# Patient Record
Sex: Male | Born: 1935
Health system: Southern US, Community
[De-identification: ages and names within clinical notes are randomized; demographics above are authoritative.]

## PROBLEM LIST (undated history)

## (undated) DIAGNOSIS — E0590 Thyrotoxicosis, unspecified without thyrotoxic crisis or storm: Secondary | ICD-10-CM

## (undated) DIAGNOSIS — K219 Gastro-esophageal reflux disease without esophagitis: Secondary | ICD-10-CM

## (undated) DIAGNOSIS — C61 Malignant neoplasm of prostate: Secondary | ICD-10-CM

## (undated) HISTORY — PX: PROSTATE SURGERY: SHX751

## (undated) HISTORY — PX: TONSILLECTOMY: SUR1361

## (undated) HISTORY — DX: Malignant neoplasm of prostate: C61

## (undated) HISTORY — DX: Gastro-esophageal reflux disease without esophagitis: K21.9

---

## 1997-12-30 ENCOUNTER — Other Ambulatory Visit: Admission: RE | Admit: 1997-12-30 | Discharge: 1997-12-30 | Payer: Self-pay | Admitting: Urology

## 1998-01-05 ENCOUNTER — Encounter: Admission: RE | Admit: 1998-01-05 | Discharge: 1998-04-05 | Payer: Self-pay | Admitting: Radiation Oncology

## 1998-02-10 ENCOUNTER — Inpatient Hospital Stay (HOSPITAL_COMMUNITY): Admission: RE | Admit: 1998-02-10 | Discharge: 1998-02-14 | Payer: Self-pay | Admitting: Urology

## 1998-02-21 ENCOUNTER — Ambulatory Visit (HOSPITAL_COMMUNITY): Admission: RE | Admit: 1998-02-21 | Discharge: 1998-02-21 | Payer: Self-pay | Admitting: Urology

## 2001-02-17 ENCOUNTER — Ambulatory Visit: Admission: RE | Admit: 2001-02-17 | Discharge: 2001-05-18 | Payer: Self-pay | Admitting: Radiation Oncology

## 2003-08-12 ENCOUNTER — Ambulatory Visit (HOSPITAL_COMMUNITY): Admission: RE | Admit: 2003-08-12 | Discharge: 2003-08-12 | Payer: Self-pay | Admitting: Orthopedic Surgery

## 2008-06-11 ENCOUNTER — Ambulatory Visit: Payer: Self-pay | Admitting: Oncology

## 2008-06-16 ENCOUNTER — Ambulatory Visit (HOSPITAL_COMMUNITY): Admission: RE | Admit: 2008-06-16 | Discharge: 2008-06-16 | Payer: Self-pay | Admitting: Urology

## 2008-06-22 LAB — CBC WITH DIFFERENTIAL/PLATELET
Basophils Absolute: 0 10*3/uL (ref 0.0–0.1)
EOS%: 7.6 % — ABNORMAL HIGH (ref 0.0–7.0)
Eosinophils Absolute: 0.5 10*3/uL (ref 0.0–0.5)
MCHC: 34.5 g/dL (ref 32.0–35.9)
MONO#: 0.8 10*3/uL (ref 0.1–0.9)
NEUT#: 4.4 10*3/uL (ref 1.5–6.5)
RBC: 5.23 10*6/uL (ref 4.20–5.71)
lymph#: 1.1 10*3/uL (ref 0.9–3.3)

## 2008-06-22 LAB — PSA: PSA: 9.45 ng/mL — ABNORMAL HIGH (ref 0.10–4.00)

## 2008-06-22 LAB — COMPREHENSIVE METABOLIC PANEL
ALT: 14 U/L (ref 0–53)
Alkaline Phosphatase: 66 U/L (ref 39–117)
BUN: 22 mg/dL (ref 6–23)
Glucose, Bld: 78 mg/dL (ref 70–99)
Total Bilirubin: 0.6 mg/dL (ref 0.3–1.2)

## 2008-07-09 ENCOUNTER — Ambulatory Visit (HOSPITAL_COMMUNITY): Admission: RE | Admit: 2008-07-09 | Discharge: 2008-07-09 | Payer: Self-pay | Admitting: Urology

## 2009-10-25 ENCOUNTER — Ambulatory Visit (HOSPITAL_COMMUNITY): Admission: RE | Admit: 2009-10-25 | Discharge: 2009-10-25 | Payer: Self-pay | Admitting: Urology

## 2011-12-11 ENCOUNTER — Other Ambulatory Visit: Payer: Self-pay | Admitting: Urology

## 2011-12-11 DIAGNOSIS — C61 Malignant neoplasm of prostate: Secondary | ICD-10-CM

## 2011-12-11 DIAGNOSIS — R972 Elevated prostate specific antigen [PSA]: Secondary | ICD-10-CM

## 2011-12-19 ENCOUNTER — Inpatient Hospital Stay (HOSPITAL_COMMUNITY)
Admission: RE | Admit: 2011-12-19 | Discharge: 2011-12-19 | Payer: Self-pay | Source: Ambulatory Visit | Attending: Urology | Admitting: Urology

## 2011-12-19 ENCOUNTER — Encounter (HOSPITAL_COMMUNITY): Payer: Self-pay

## 2011-12-26 ENCOUNTER — Encounter (HOSPITAL_COMMUNITY)
Admission: RE | Admit: 2011-12-26 | Discharge: 2011-12-26 | Disposition: A | Discharge status: Home / Self Care | Payer: Medicare Other | Source: Ambulatory Visit | Attending: Urology | Admitting: Urology

## 2011-12-26 DIAGNOSIS — R972 Elevated prostate specific antigen [PSA]: Secondary | ICD-10-CM | POA: Insufficient documentation

## 2011-12-26 DIAGNOSIS — C61 Malignant neoplasm of prostate: Secondary | ICD-10-CM

## 2011-12-26 MED ORDER — TECHNETIUM TC 99M MEDRONATE IV KIT
25.0000 | PACK | Freq: Once | INTRAVENOUS | Status: AC | PRN
  Administered 2011-12-26: 25 via INTRAVENOUS

## 2012-11-06 ENCOUNTER — Other Ambulatory Visit: Payer: Self-pay | Admitting: Urology

## 2012-11-06 DIAGNOSIS — C61 Malignant neoplasm of prostate: Secondary | ICD-10-CM

## 2012-12-26 ENCOUNTER — Encounter (HOSPITAL_COMMUNITY)
Admission: RE | Admit: 2012-12-26 | Discharge: 2012-12-26 | Disposition: A | Discharge status: Home / Self Care | Payer: Medicare Other | Source: Ambulatory Visit | Attending: Urology | Admitting: Urology

## 2012-12-26 DIAGNOSIS — C61 Malignant neoplasm of prostate: Secondary | ICD-10-CM | POA: Insufficient documentation

## 2012-12-26 MED ORDER — TECHNETIUM TC 99M MEDRONATE IV KIT
24.9000 | PACK | Freq: Once | INTRAVENOUS | Status: AC | PRN
  Administered 2012-12-26: 24.9 via INTRAVENOUS

## 2013-11-26 ENCOUNTER — Other Ambulatory Visit: Payer: Self-pay | Admitting: Urology

## 2013-11-26 DIAGNOSIS — C61 Malignant neoplasm of prostate: Secondary | ICD-10-CM

## 2014-01-14 ENCOUNTER — Encounter (HOSPITAL_COMMUNITY): Payer: Medicare Other

## 2014-01-29 ENCOUNTER — Encounter (HOSPITAL_COMMUNITY)
Admission: RE | Admit: 2014-01-29 | Discharge: 2014-01-29 | Disposition: A | Discharge status: Home / Self Care | Payer: Medicare Other | Source: Ambulatory Visit | Attending: Urology | Admitting: Urology

## 2014-01-29 DIAGNOSIS — C61 Malignant neoplasm of prostate: Secondary | ICD-10-CM | POA: Insufficient documentation

## 2014-01-29 MED ORDER — TECHNETIUM TC 99M MEDRONATE IV KIT
26.0000 | PACK | Freq: Once | INTRAVENOUS | Status: AC | PRN
  Administered 2014-01-29: 26 via INTRAVENOUS

## 2014-04-07 ENCOUNTER — Telehealth: Payer: Self-pay | Admitting: Oncology

## 2014-04-07 NOTE — Telephone Encounter (Signed)
LEFT MESSAGE FOR PATIENT AND GAVE NP APPT FOR 09/18 @ 10:30 W/DR. SHADAD. LEFT CONTACT INFORMATION FOR PATIENT TO RETURN CALL AND CONFIRM NP APPT.  [place] [facility] DX- PROSTATE CA

## 2014-04-08 ENCOUNTER — Telehealth: Payer: Self-pay | Admitting: Oncology

## 2014-04-08 ENCOUNTER — Other Ambulatory Visit: Payer: Self-pay | Admitting: Oncology

## 2014-04-08 DIAGNOSIS — C61 Malignant neoplasm of prostate: Secondary | ICD-10-CM

## 2014-04-08 NOTE — Telephone Encounter (Signed)
C/D 04/08/14 for appt. 04/09/14 °

## 2014-04-08 NOTE — Telephone Encounter (Signed)
PATIENT CALLED TO CONFIRM NP APPT FOR 09/18 @ 10:30 W/DR. SHADAD.  REFERRING DR. Diona Fanti  DX-PROSTATE CA

## 2014-04-09 ENCOUNTER — Other Ambulatory Visit (HOSPITAL_BASED_OUTPATIENT_CLINIC_OR_DEPARTMENT_OTHER): Payer: Medicare Other

## 2014-04-09 ENCOUNTER — Encounter: Payer: Self-pay | Admitting: Oncology

## 2014-04-09 ENCOUNTER — Ambulatory Visit (HOSPITAL_BASED_OUTPATIENT_CLINIC_OR_DEPARTMENT_OTHER): Payer: Medicare Other | Admitting: Oncology

## 2014-04-09 ENCOUNTER — Encounter (INDEPENDENT_AMBULATORY_CARE_PROVIDER_SITE_OTHER): Payer: Self-pay

## 2014-04-09 ENCOUNTER — Telehealth: Payer: Self-pay | Admitting: Oncology

## 2014-04-09 ENCOUNTER — Ambulatory Visit (HOSPITAL_BASED_OUTPATIENT_CLINIC_OR_DEPARTMENT_OTHER): Payer: Medicare Other

## 2014-04-09 VITALS — BP 179/77 | HR 81 | Temp 97.8°F | Wt 210.0 lb

## 2014-04-09 DIAGNOSIS — C61 Malignant neoplasm of prostate: Secondary | ICD-10-CM

## 2014-04-09 DIAGNOSIS — E291 Testicular hypofunction: Secondary | ICD-10-CM

## 2014-04-09 LAB — CBC WITH DIFFERENTIAL/PLATELET
BASO%: 0.5 % (ref 0.0–2.0)
BASOS ABS: 0 10*3/uL (ref 0.0–0.1)
EOS ABS: 0.5 10*3/uL (ref 0.0–0.5)
EOS%: 7.9 % — ABNORMAL HIGH (ref 0.0–7.0)
HEMATOCRIT: 42.5 % (ref 38.4–49.9)
HGB: 13.8 g/dL (ref 13.0–17.1)
LYMPH#: 1.4 10*3/uL (ref 0.9–3.3)
LYMPH%: 23.9 % (ref 14.0–49.0)
MCH: 28.3 pg (ref 27.2–33.4)
MCHC: 32.4 g/dL (ref 32.0–36.0)
MCV: 87.1 fL (ref 79.3–98.0)
MONO#: 0.8 10*3/uL (ref 0.1–0.9)
MONO%: 14.7 % — AB (ref 0.0–14.0)
NEUT%: 53 % (ref 39.0–75.0)
NEUTROS ABS: 3 10*3/uL (ref 1.5–6.5)
PLATELETS: 268 10*3/uL (ref 140–400)
RBC: 4.88 10*6/uL (ref 4.20–5.82)
RDW: 12.6 % (ref 11.0–14.6)
WBC: 5.7 10*3/uL (ref 4.0–10.3)

## 2014-04-09 LAB — COMPREHENSIVE METABOLIC PANEL (CC13)
ALBUMIN: 3.7 g/dL (ref 3.5–5.0)
ALK PHOS: 92 U/L (ref 40–150)
ALT: 19 U/L (ref 0–55)
AST: 20 U/L (ref 5–34)
Anion Gap: 9 mEq/L (ref 3–11)
BUN: 15.7 mg/dL (ref 7.0–26.0)
CALCIUM: 9.1 mg/dL (ref 8.4–10.4)
CO2: 25 mEq/L (ref 22–29)
Chloride: 106 mEq/L (ref 98–109)
Creatinine: 0.8 mg/dL (ref 0.7–1.3)
GLUCOSE: 96 mg/dL (ref 70–140)
POTASSIUM: 4.3 meq/L (ref 3.5–5.1)
SODIUM: 140 meq/L (ref 136–145)
TOTAL PROTEIN: 6.7 g/dL (ref 6.4–8.3)
Total Bilirubin: 0.49 mg/dL (ref 0.20–1.20)

## 2014-04-09 MED ORDER — PREDNISONE 5 MG PO TABS: 5.0000 mg | ORAL_TABLET | Freq: Every day | ORAL | Status: DC

## 2014-04-09 MED ORDER — KETOCONAZOLE 200 MG PO TABS: ORAL_TABLET | ORAL | Status: DC

## 2014-04-09 NOTE — Progress Notes (Signed)
Checked in new patient with no financial issues prior to seeing the dr. He has appt card and has not been out of the country. °

## 2014-04-09 NOTE — Progress Notes (Signed)
Please see consult note.  

## 2014-04-09 NOTE — Telephone Encounter (Signed)
gv adn printed appt sched and avs for pt for OCT....sed gv barium

## 2014-04-09 NOTE — Consult Note (Signed)
Reason for Referral: Prostate cancer.   HPI: 78 year old gentleman currently retired and has lived in this area for the last 30 years. He is a rather healthy gentleman without any significant comorbid conditions. He was diagnosed with prostate cancer dating back in June of 1999. His PSA was around 4 and the biopsy showed a Gleason score 3+3 equals 6. On 02/10/1998 he underwent radical prostatectomy with the final pathology revealed a Gleason score 3+4 equals 7, stage T3b disease with extracapsular extension. His PSA become undetectable to about 2002. His PSA was 0.61. He completed salvage radiation therapy in 05/09/2001. But his PSA continued to rise and was started on Lupron in March of 2010 for a PSA of 22. His PSA nadir was as low as 0.46 and was given intermittently. His PSA persistently started to rise and was up to 9.82 in February of 2015 whereby Casodex was added. Despite that, the PSA continued to rise in July was up to 11.6 and in August of 2015 was up to 12. Repeat bone scan in July 2050 did not show any evidence of measurable disease.  Clinically he is asymptomatic. He does not report any bone pain or constitutional symptoms. He does not report any dysuria or hematuria. He does report nocturia and frequency. He does not report any headaches, blurred vision or syncope. He does not report any chest pain, shortness of breath, leg edema or palpitation. He does not report any wheezing, cough or hemoptysis. Is not report any fevers, chills or weight loss. He is to be active and performs activities of daily living. He reports no nausea, vomiting, constipation, diarrhea. He does not report any skeletal complaints no arthralgias or myalgias. He is not report any lymphadenopathy or petechiae. He continues to be an excellent performance status and rest of his review of systems unremarkable.   Past Medical History  Diagnosis Date  . Prostate cancer   . GERD (gastroesophageal reflux disease)    :  Current Outpatient Prescriptions  Medication Sig Dispense Refill  . bicalutamide (CASODEX) 50 MG tablet Take 50 mg by mouth daily.      Marland Kitchen ketoconazole (NIZORAL) 200 MG tablet Take one tablet by mouth twice a day.  60 tablet  3  . predniSONE (DELTASONE) 5 MG tablet Take 1 tablet (5 mg total) by mouth daily with breakfast.  30 tablet  3   No current facility-administered medications for this visit.    Allergies  Allergen Reactions  . Amoxicillin   :   Family history: He has 2 brothers had prostate cancer.   History   Social History  . Marital Status: Married    Spouse Name: N/A    Number of Children: N/A  . Years of Education: N/A   Occupational History  . Not on file.   Social History Main Topics  . Smoking status: Not on file  . Smokeless tobacco: Not on file  . Alcohol Use: Not on file  . Drug Use: Not on file  . Sexual Activity: Not on file   Other Topics Concern  . Not on file   Social History Narrative  . No narrative on file  :  Pertinent items are noted in HPI.  Exam: ECOG 0 Blood pressure 179/77, pulse 81, temperature 97.8 F (36.6 C), temperature source Oral, weight 210 lb (95.255 kg), SpO2 99.00%. General appearance: alert and cooperative Head: Normocephalic, without obvious abnormality Throat: lips, mucosa, and tongue normal; teeth and gums normal Neck: no adenopathy Back: symmetric, no curvature.  ROM normal. No CVA tenderness. Resp: clear to auscultation bilaterally Chest wall: no tenderness Cardio: regular rate and rhythm, S1, S2 normal, no murmur, click, rub or gallop GI: soft, non-tender; bowel sounds normal; no masses,  no organomegaly Extremities: extremities normal, atraumatic, no cyanosis or edema Pulses: 2+ and symmetric Skin: Skin color, texture, turgor normal. No rashes or lesions Lymph nodes: Cervical, supraclavicular, and axillary nodes normal. Neurologic: Grossly normal   Recent Labs  04/09/14 1037  WBC 5.7  HGB 13.8   HCT 42.5  PLT 268    Assessment and Plan:   78 year old gentleman with the following issues:  1. Prostate cancer diagnosed in 1999 with a Gleason score 3+4 equals 7 and a PSA of around 4. He is status post prostatectomy and found to have stage T3b disease. He received salvage radiation therapy for a rise in his PSA. He developed biochemical relapse and was treated with androgen depravation intermittently and most recently continuously with excellent response between 2010 to about 2015. 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 The natural course of this disease was discussed with the patient in detail. Options for treatment were discussed including second line hormonal manipulation with ketoconazole and prednisone, Nicki Reaper, immunotherapy and systemic chemotherapy. The first up is to complete the staging process with a CT scan of the abdomen and pelvis. If he has measurable disease I favor using Xtandi or systemic chemotherapy. If he has no measurable disease, then ketoconazole and prednisone as a reasonable option after stopping Casodex.  The risks and benefits of ketoconazole and prednisone were discussed. Complications include GI toxicity, skin toxicity, adrenal insufficiency, electrolyte imbalance as well as complications related to prednisone were discussed. He is agreeable to try for the time being if his CT scan showed no measurable disease.  I will set him up with a followup in one month to assess response in any case.  2. Androgen depravation: This will be continued under the care of Dr. Diona Fanti for the time being.

## 2014-04-10 LAB — PSA: PSA: 17.17 ng/mL — AB (ref <=4.00)

## 2014-04-10 LAB — TESTOSTERONE: TESTOSTERONE: 47 ng/dL — AB (ref 300–890)

## 2014-04-12 ENCOUNTER — Ambulatory Visit (HOSPITAL_COMMUNITY)
Admission: RE | Admit: 2014-04-12 | Discharge: 2014-04-12 | Disposition: A | Discharge status: Home / Self Care | Payer: Medicare Other | Source: Ambulatory Visit | Attending: Oncology | Admitting: Oncology

## 2014-04-12 DIAGNOSIS — C61 Malignant neoplasm of prostate: Secondary | ICD-10-CM | POA: Diagnosis not present

## 2014-04-12 MED ORDER — IOHEXOL 300 MG/ML  SOLN
100.0000 mL | Freq: Once | INTRAMUSCULAR | Status: AC | PRN
  Administered 2014-04-12: 100 mL via INTRAVENOUS

## 2014-04-19 ENCOUNTER — Encounter: Payer: Self-pay | Admitting: Oncology

## 2014-05-07 ENCOUNTER — Other Ambulatory Visit: Payer: Self-pay

## 2014-05-10 ENCOUNTER — Other Ambulatory Visit (HOSPITAL_BASED_OUTPATIENT_CLINIC_OR_DEPARTMENT_OTHER): Payer: Medicare Other

## 2014-05-10 DIAGNOSIS — C61 Malignant neoplasm of prostate: Secondary | ICD-10-CM

## 2014-05-10 LAB — COMPREHENSIVE METABOLIC PANEL (CC13)
ALK PHOS: 74 U/L (ref 40–150)
ALT: 23 U/L (ref 0–55)
ANION GAP: 8 meq/L (ref 3–11)
AST: 16 U/L (ref 5–34)
Albumin: 3.4 g/dL — ABNORMAL LOW (ref 3.5–5.0)
BILIRUBIN TOTAL: 0.47 mg/dL (ref 0.20–1.20)
BUN: 19.7 mg/dL (ref 7.0–26.0)
CHLORIDE: 107 meq/L (ref 98–109)
CO2: 27 meq/L (ref 22–29)
Calcium: 9.1 mg/dL (ref 8.4–10.4)
Creatinine: 1.1 mg/dL (ref 0.7–1.3)
GLUCOSE: 102 mg/dL (ref 70–140)
Potassium: 4 mEq/L (ref 3.5–5.1)
SODIUM: 142 meq/L (ref 136–145)
Total Protein: 6 g/dL — ABNORMAL LOW (ref 6.4–8.3)

## 2014-05-10 LAB — PSA: PSA: 12.36 ng/mL — AB (ref <=4.00)

## 2014-05-10 LAB — CBC WITH DIFFERENTIAL/PLATELET
BASO%: 0.6 % (ref 0.0–2.0)
BASOS ABS: 0 10*3/uL (ref 0.0–0.1)
EOS%: 9.9 % — ABNORMAL HIGH (ref 0.0–7.0)
Eosinophils Absolute: 0.5 10*3/uL (ref 0.0–0.5)
HCT: 42 % (ref 38.4–49.9)
HGB: 13.5 g/dL (ref 13.0–17.1)
LYMPH%: 20.9 % (ref 14.0–49.0)
MCH: 28.4 pg (ref 27.2–33.4)
MCHC: 32.2 g/dL (ref 32.0–36.0)
MCV: 88.1 fL (ref 79.3–98.0)
MONO#: 0.6 10*3/uL (ref 0.1–0.9)
MONO%: 10.6 % (ref 0.0–14.0)
NEUT#: 3.1 10*3/uL (ref 1.5–6.5)
NEUT%: 58 % (ref 39.0–75.0)
PLATELETS: 254 10*3/uL (ref 140–400)
RBC: 4.77 10*6/uL (ref 4.20–5.82)
RDW: 13.2 % (ref 11.0–14.6)
WBC: 5.4 10*3/uL (ref 4.0–10.3)
lymph#: 1.1 10*3/uL (ref 0.9–3.3)

## 2014-05-11 ENCOUNTER — Telehealth: Payer: Self-pay | Admitting: Oncology

## 2014-05-11 ENCOUNTER — Ambulatory Visit (HOSPITAL_BASED_OUTPATIENT_CLINIC_OR_DEPARTMENT_OTHER): Payer: Medicare Other | Admitting: Oncology

## 2014-05-11 VITALS — BP 131/85 | HR 76 | Temp 97.5°F | Resp 18 | Wt 207.2 lb

## 2014-05-11 DIAGNOSIS — C61 Malignant neoplasm of prostate: Secondary | ICD-10-CM

## 2014-05-11 NOTE — Progress Notes (Signed)
Hematology and Oncology Follow Up Visit  Edward Bailey [id number] [birth year] 78 y.o. 05/11/2014 4:12 PM No primary provider on file.No ref. provider found   Principle Diagnosis: 78 year old gentleman with prostate cancer diagnosed in 1999 with a Gleason score 3+4 equals 7 and a PSA of around 4. He is currently experiencing a biochemical relapse without any measurable disease.    Prior Therapy: He is status post prostatectomy and found to have stage T3b disease. He received salvage radiation therapy for a rise in his PSA. He developed biochemical relapse and was treated with androgen depravation intermittently and most recently continuously with excellent response between 2010 to about 2015. 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   Current therapy: Ketoconazole at 200 mg twice a day and prednisone 5 mg daily.  Interim History:  Edward Bailey presents today for a followup visit. Since her last visit, he started ketoconazole and prednisone without any complications. He did not report any nausea, vomiting, lower extremity edema or ecchymosis. He did not report any bony pain or worsening back pain.He does not report any dysuria or hematuria. He does report nocturia and frequency. He does not report any headaches, blurred vision or syncope. He does not report any chest pain, shortness of breath, leg edema or palpitation. He does not report any wheezing, cough or hemoptysis. Is not report any fevers, chills or weight loss. He is to be active and performs activities of daily living. He reports no nausea, vomiting, constipation, diarrhea. He does not report any skeletal complaints no arthralgias or myalgias. He is not report any lymphadenopathy or petechiae. He continues to be an excellent performance status and rest of his review of systems unremarkable.      Medications: I have reviewed the patient's current medications.   Current Outpatient Prescriptions  Medication Sig Dispense Refill  . ketoconazole (NIZORAL) 200 MG tablet Take one tablet by mouth twice a day.  60 tablet  3  . predniSONE (DELTASONE) 5 MG tablet Take 1 tablet (5 mg total) by mouth daily with breakfast.  30 tablet  3   No current facility-administered medications for this visit.     Allergies:  Allergies  Allergen Reactions  . Amoxicillin     Past Medical History, Surgical history, Social history, and Family History were reviewed and updated.   Physical Exam: Blood pressure 131/85, pulse 76, temperature 97.5 F (36.4 C), temperature source Oral, resp. rate 18, weight 207 lb 3.2 oz (93.985 kg). ECOG:  General appearance: alert and cooperative Head: Normocephalic, without obvious abnormality Neck: no adenopathy Lymph nodes: Cervical, supraclavicular, and axillary nodes normal. Heart:regular rate and rhythm, S1, S2 normal, no murmur, click, rub or gallop Lung:chest clear, no wheezing, rales, normal symmetric air entry Abdomin: soft, non-tender, without masses or organomegaly EXT:no erythema, induration, or nodules   Lab Results: Lab Results  Component Value Date   WBC 5.4 05/10/2014   HGB 13.5 05/10/2014   HCT 42.0 05/10/2014   MCV 88.1 05/10/2014   PLT 254 05/10/2014     Chemistry      Component Value Date/Time   NA 142 05/10/2014 0902   NA 139 06/22/2008 1042   K 4.0 05/10/2014 0902   K 5.1 06/22/2008 1042   CL 104 06/22/2008 1042   CO2 27 05/10/2014 0902   CO2 28 06/22/2008 1042   BUN 19.7 05/10/2014 0902   BUN 22 06/22/2008 1042   CREATININE 1.1 05/10/2014 0902   CREATININE 1.24  06/22/2008 1042      Component Value Date/Time   CALCIUM 9.1 05/10/2014 0902   CALCIUM 9.5 06/22/2008 1042   ALKPHOS 74 05/10/2014 0902   ALKPHOS 66 06/22/2008 1042   AST 16 05/10/2014 0902   AST 21 06/22/2008 1042   ALT 23 05/10/2014 0902   ALT 14 06/22/2008 1042   BILITOT 0.47 05/10/2014 0902   BILITOT 0.6 06/22/2008 1042       Results for Edward Bailey (MRN [id number]) as of 05/11/2014 15:41  Ref. Range 04/09/2014 10:37 05/10/2014 09:02  PSA Latest Range: <=4.00 ng/mL 17.17 (H) 12.36 (H)     EXAM:  CT ABDOMEN AND PELVIS WITH CONTRAST  TECHNIQUE:  Multidetector CT imaging of the abdomen and pelvis was performed  using the standard protocol following bolus administration of  intravenous contrast.  CONTRAST: 125mL OMNIPAQUE IOHEXOL 300 MG/ML SOLN  COMPARISON: Bone scan 01/29/2014  FINDINGS:  Visualization of the lower thorax demonstrates dependent  atelectasis. Normal heart size. Coronary arterial vascular  calcifications.  The left hepatic lobe is atrophic. No focal hepatic lesion is  identified. Gallbladder is unremarkable. No intrahepatic or  extrahepatic biliary ductal dilatation. Spleen, pancreas and  bilateral adrenal glands are unremarkable.  Kidneys are normal. No hydronephrosis.  Normal caliber abdominal aorta with scattered calcified  atherosclerotic plaque. No retroperitoneal lymphadenopathy. Small  fat containing left inguinal hernia.  Patient status post prostatectomy. Multiple surgical clips within  the pelvis and pelvic sidewalls. Urinary bladder is unremarkable.  Sub cm lymph nodes along the right common iliac and right pelvic  sidewall are stable.  Stool is present throughout the colon. The appendix is normal. No  bowel obstruction. No free fluid or free intraperitoneal air.  There is an 8 mm sclerotic focus within the left lateral sacral ala  (image 60; series 2).  IMPRESSION:  Postsurgical changes compatible with prostatectomy and  lymphadenectomy. No CT evidence to suggest local recurrence or  metastatic disease within the abdomen or pelvis.  8 mm sclerotic focus within the left lateral sacral ala new from  prior examination dated 2011 however, in light of negative bone  scan, favored to be degenerative in etiology. Attention on followup.     Impression and Plan:  78  year-old gentleman with the following issues:  1. Prostate cancer diagnosed in 1999 with a Gleason score 3+4 equals 7 and a PSA of around 4. He is status post prostatectomy and found to have stage T3b disease. He developed a biochemical relapse and treated with intermittent androgen deprivation and currently has a rise in his PSA despite castrate levels testosterone. His CT scan on 04/12/2014 was discussed and showed no evidence of any metastatic disease. He started ketoconazole and prednisone and tolerated it well. His PSA dropped down from 17.7-12.36 after one month of therapy. He reports no complications from this medication and the plan is to continue on the current dose and schedule. His liver function tests and electrolytes are all within normal range.  2. Androgen depravation: He'll continues under the care of [place].  3. Followup: In 6 weeks to repeat laboratory testing.       Zola Button, MD 10/20/20154:12 PM

## 2014-05-11 NOTE — Telephone Encounter (Signed)
gv and printed appt sched and avs for pt for NOV and Dec °

## 2014-05-13 ENCOUNTER — Encounter: Payer: Self-pay | Admitting: Oncology

## 2014-05-13 ENCOUNTER — Other Ambulatory Visit: Payer: Self-pay | Admitting: Oncology

## 2014-05-14 ENCOUNTER — Other Ambulatory Visit: Payer: Self-pay | Admitting: *Deleted

## 2014-06-16 ENCOUNTER — Other Ambulatory Visit: Payer: Self-pay | Admitting: Oncology

## 2014-06-18 ENCOUNTER — Other Ambulatory Visit (HOSPITAL_BASED_OUTPATIENT_CLINIC_OR_DEPARTMENT_OTHER): Payer: Medicare Other

## 2014-06-18 DIAGNOSIS — C61 Malignant neoplasm of prostate: Secondary | ICD-10-CM

## 2014-06-18 LAB — CBC WITH DIFFERENTIAL/PLATELET
BASO%: 0.9 % (ref 0.0–2.0)
BASOS ABS: 0 10*3/uL (ref 0.0–0.1)
EOS%: 9.8 % — ABNORMAL HIGH (ref 0.0–7.0)
Eosinophils Absolute: 0.6 10*3/uL — ABNORMAL HIGH (ref 0.0–0.5)
HCT: 43.6 % (ref 38.4–49.9)
HEMOGLOBIN: 14.4 g/dL (ref 13.0–17.1)
LYMPH%: 20.9 % (ref 14.0–49.0)
MCH: 29.2 pg (ref 27.2–33.4)
MCHC: 32.9 g/dL (ref 32.0–36.0)
MCV: 88.6 fL (ref 79.3–98.0)
MONO#: 0.5 10*3/uL (ref 0.1–0.9)
MONO%: 9.2 % (ref 0.0–14.0)
NEUT#: 3.4 10*3/uL (ref 1.5–6.5)
NEUT%: 59.2 % (ref 39.0–75.0)
PLATELETS: 287 10*3/uL (ref 140–400)
RBC: 4.93 10*6/uL (ref 4.20–5.82)
RDW: 13.6 % (ref 11.0–14.6)
WBC: 5.8 10*3/uL (ref 4.0–10.3)
lymph#: 1.2 10*3/uL (ref 0.9–3.3)

## 2014-06-18 LAB — COMPREHENSIVE METABOLIC PANEL (CC13)
ALT: 18 U/L (ref 0–55)
ANION GAP: 9 meq/L (ref 3–11)
AST: 17 U/L (ref 5–34)
Albumin: 3.7 g/dL (ref 3.5–5.0)
Alkaline Phosphatase: 88 U/L (ref 40–150)
BILIRUBIN TOTAL: 0.59 mg/dL (ref 0.20–1.20)
BUN: 19.4 mg/dL (ref 7.0–26.0)
CO2: 26 meq/L (ref 22–29)
CREATININE: 1.1 mg/dL (ref 0.7–1.3)
Calcium: 9.1 mg/dL (ref 8.4–10.4)
Chloride: 107 mEq/L (ref 98–109)
Glucose: 119 mg/dl (ref 70–140)
Potassium: 4 mEq/L (ref 3.5–5.1)
Sodium: 142 mEq/L (ref 136–145)
Total Protein: 6.2 g/dL — ABNORMAL LOW (ref 6.4–8.3)

## 2014-06-19 LAB — PSA: PSA: 11.01 ng/mL — ABNORMAL HIGH (ref <=4.00)

## 2014-06-22 ENCOUNTER — Ambulatory Visit (HOSPITAL_BASED_OUTPATIENT_CLINIC_OR_DEPARTMENT_OTHER): Payer: Medicare Other | Admitting: Oncology

## 2014-06-22 ENCOUNTER — Telehealth: Payer: Self-pay | Admitting: Oncology

## 2014-06-22 VITALS — BP 157/75 | HR 74 | Temp 97.6°F | Resp 18 | Ht 72.0 in | Wt 208.5 lb

## 2014-06-22 DIAGNOSIS — E291 Testicular hypofunction: Secondary | ICD-10-CM

## 2014-06-22 DIAGNOSIS — C61 Malignant neoplasm of prostate: Secondary | ICD-10-CM

## 2014-06-22 NOTE — Progress Notes (Signed)
Hematology and Oncology Follow Up Visit  GARL SPEIGNER [id number] Mar 07, 1936 78 y.o. 06/22/2014 3:00 PM No primary care provider on file.No ref. provider found   Principle Diagnosis: 78 year old gentleman with prostate cancer diagnosed in 1999 with a Gleason score 3+4 equals 7 and a PSA of around 4. He is currently experiencing a biochemical relapse without any measurable disease.    Prior Therapy: He is status post prostatectomy and found to have stage T3b disease. He received salvage radiation therapy for a rise in his PSA. He developed biochemical relapse and was treated with androgen depravation intermittently and most recently continuously with excellent response between 2010 to about 2015. 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   Current therapy: Ketoconazole at 200 mg twice a day and prednisone 5 mg daily.  Interim History:  Mr. Guerrette presents today for a followup visit. Since the last visit, he continues to do well without any issues. He is still on ketoconazole and prednisone without any new side effects. He continues to enjoy excellent quality of life and performance status. He did not report any nausea, vomiting, lower extremity edema or ecchymosis. He did not report any bony pain or worsening back pain.He does not report any dysuria or hematuria. He does report nocturia and frequency. He does not report any headaches, blurred vision or syncope. He does not report any chest pain, shortness of breath, leg edema or palpitation. He does not report any wheezing, cough or hemoptysis. Is not report any fevers, chills or weight loss. He is to be active and performs activities of daily living. He reports no nausea, vomiting, constipation, diarrhea. He does not report any skeletal complaints no arthralgias or myalgias. He is not report any lymphadenopathy or petechiae. The rest of his review of systems unremarkable.       Medications: I have reviewed the patient's current medications.  Current Outpatient Prescriptions  Medication Sig Dispense Refill  . bicalutamide (CASODEX) 50 MG tablet Take 50 mg by mouth daily.  3  . ketoconazole (NIZORAL) 200 MG tablet Take one tablet by mouth twice a day. 60 tablet 3  . predniSONE (DELTASONE) 5 MG tablet TAKE 1 TABLET EVERY DAY WITH BREAKFAST 30 tablet 0   No current facility-administered medications for this visit.     Allergies:  Allergies  Allergen Reactions  . Amoxicillin     Past Medical History, Surgical history, Social history, and Family History were reviewed and updated.   Physical Exam: Blood pressure 157/75, pulse 74, temperature 97.6 F (36.4 C), temperature source Oral, resp. rate 18, height 6' (1.829 m), weight 208 lb 8 oz (94.575 kg). ECOG:  General appearance: alert and cooperative Head: Normocephalic, without obvious abnormality Neck: no adenopathy Lymph nodes: Cervical, supraclavicular, and axillary nodes normal. Heart:regular rate and rhythm, S1, S2 normal, no murmur, click, rub or gallop Lung:chest clear, no wheezing, rales, normal symmetric air entry Abdomin: soft, non-tender, without masses or organomegaly EXT:no erythema, induration, or nodules   Lab Results: Lab Results  Component Value Date   WBC 5.8 06/18/2014   HGB 14.4 06/18/2014   HCT 43.6 06/18/2014   MCV 88.6 06/18/2014   PLT 287 06/18/2014     Chemistry      Component Value Date/Time   NA 142 06/18/2014 0858   NA 139 06/22/2008 1042   K 4.0 06/18/2014 0858   K 5.1 06/22/2008 1042   CL 104 06/22/2008 1042   CO2 26 06/18/2014 0858  CO2 28 06/22/2008 1042   BUN 19.4 06/18/2014 0858   BUN 22 06/22/2008 1042   CREATININE 1.1 06/18/2014 0858   CREATININE 1.24 06/22/2008 1042      Component Value Date/Time   CALCIUM 9.1 06/18/2014 0858   CALCIUM 9.5 06/22/2008 1042   ALKPHOS 88 06/18/2014 0858   ALKPHOS 66 06/22/2008 1042   AST 17 06/18/2014 0858    AST 21 06/22/2008 1042   ALT 18 06/18/2014 0858   ALT 14 06/22/2008 1042   BILITOT 0.59 06/18/2014 0858   BILITOT 0.6 06/22/2008 1042        Results for TODDRICK, SANNA (MRN [id number]) as of 06/22/2014 15:02  Ref. Range 04/09/2014 10:37 05/10/2014 09:02 06/18/2014 08:58  PSA Latest Range: <=4.00 ng/mL 17.17 (H) 12.36 (H) 11.01 (H)       Impression and Plan:  78 year-old gentleman with the following issues:  1. Prostate cancer diagnosed in 1999 with a Gleason score 3+4 equals 7 and a PSA of around 4. He is status post prostatectomy and found to have stage T3b disease. He developed a biochemical relapse and treated with intermittent androgen deprivation and currently has a rise in his PSA despite castrate levels testosterone. His CT scan on 04/12/2014 showed no evidence of any metastatic disease. He is on ketoconazole and prednisone and tolerated it well. His PSA dropped down from 17.7 to 11 so far. He reports no complications from this medication and the plan is to continue on the current dose and schedule. His liver function tests and electrolytes are all within normal range.  2. Androgen depravation: He was advised to continue this under the care of [place].  3. Followup: In 5- 6 weeks to repeat laboratory testing.       [id number],QPYPP, MD 12/1/20153:00 PM

## 2014-06-22 NOTE — Telephone Encounter (Signed)
Pt confirmed labs/ov per 12/01 POF, gave pt AVS.... KJ pt wanted a Friday

## 2014-07-04 ENCOUNTER — Other Ambulatory Visit: Payer: Self-pay | Admitting: Oncology

## 2014-07-26 ENCOUNTER — Other Ambulatory Visit (HOSPITAL_BASED_OUTPATIENT_CLINIC_OR_DEPARTMENT_OTHER): Payer: Medicare Other

## 2014-07-26 DIAGNOSIS — C61 Malignant neoplasm of prostate: Secondary | ICD-10-CM

## 2014-07-26 LAB — CBC WITH DIFFERENTIAL/PLATELET
BASO%: 0.3 % (ref 0.0–2.0)
BASOS ABS: 0 10*3/uL (ref 0.0–0.1)
EOS%: 7.3 % — ABNORMAL HIGH (ref 0.0–7.0)
Eosinophils Absolute: 0.5 10*3/uL (ref 0.0–0.5)
HCT: 44.5 % (ref 38.4–49.9)
HGB: 14.7 g/dL (ref 13.0–17.1)
LYMPH%: 19.8 % (ref 14.0–49.0)
MCH: 29.4 pg (ref 27.2–33.4)
MCHC: 33 g/dL (ref 32.0–36.0)
MCV: 89 fL (ref 79.3–98.0)
MONO#: 0.7 10*3/uL (ref 0.1–0.9)
MONO%: 11.4 % (ref 0.0–14.0)
NEUT%: 61.2 % (ref 39.0–75.0)
NEUTROS ABS: 3.9 10*3/uL (ref 1.5–6.5)
Platelets: 263 10*3/uL (ref 140–400)
RBC: 5 10*6/uL (ref 4.20–5.82)
RDW: 13.1 % (ref 11.0–14.6)
WBC: 6.4 10*3/uL (ref 4.0–10.3)
lymph#: 1.3 10*3/uL (ref 0.9–3.3)

## 2014-07-26 LAB — COMPREHENSIVE METABOLIC PANEL (CC13)
ALK PHOS: 82 U/L (ref 40–150)
ALT: 23 U/L (ref 0–55)
AST: 21 U/L (ref 5–34)
Albumin: 3.9 g/dL (ref 3.5–5.0)
Anion Gap: 9 mEq/L (ref 3–11)
BILIRUBIN TOTAL: 0.68 mg/dL (ref 0.20–1.20)
BUN: 16.5 mg/dL (ref 7.0–26.0)
CO2: 28 mEq/L (ref 22–29)
CREATININE: 1.1 mg/dL (ref 0.7–1.3)
Calcium: 9.1 mg/dL (ref 8.4–10.4)
Chloride: 103 mEq/L (ref 98–109)
EGFR: 68 mL/min/{1.73_m2} — ABNORMAL LOW (ref >90)
GLUCOSE: 88 mg/dL (ref 70–140)
Potassium: 3.9 mEq/L (ref 3.5–5.1)
SODIUM: 140 meq/L (ref 136–145)
Total Protein: 6.6 g/dL (ref 6.4–8.3)

## 2014-07-26 LAB — PSA: PSA: 10.42 ng/mL — AB (ref <=4.00)

## 2014-07-30 ENCOUNTER — Telehealth: Payer: Self-pay | Admitting: Oncology

## 2014-07-30 ENCOUNTER — Ambulatory Visit (HOSPITAL_BASED_OUTPATIENT_CLINIC_OR_DEPARTMENT_OTHER): Payer: Medicare Other | Admitting: Oncology

## 2014-07-30 VITALS — BP 134/69 | HR 81 | Temp 97.8°F | Resp 18 | Ht 72.0 in | Wt 212.3 lb

## 2014-07-30 DIAGNOSIS — C61 Malignant neoplasm of prostate: Secondary | ICD-10-CM

## 2014-07-30 NOTE — Telephone Encounter (Signed)
Pt confirmed labs/ov per 01/08 POF, gave pt AVS.... KJ

## 2014-07-30 NOTE — Progress Notes (Signed)
Hematology and Oncology Follow Up Visit  Edward Bailey [id number] 02-15-1936 79 y.o. 07/30/2014 4:05 PM No primary care provider on file.No ref. provider found   Principle Diagnosis: 79 year old gentleman with prostate cancer diagnosed in 1999 with a Gleason score 3+4 equals 7 and a PSA of around 4. He is currently experiencing a biochemical relapse without any measurable disease.   Prior Therapy:  He is status post prostatectomy and found to have stage T3b disease. He received salvage radiation therapy for a rise in his PSA.  He developed biochemical relapse and was treated with androgen depravation intermittently and most recently continuously with excellent response between 2010 to about 2015.  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   Current therapy: Ketoconazole at 200 mg twice a day and prednisone 5 mg daily.  Interim History:  Edward Bailey presents today for a followup visit. Since the last visit, he reports no new complaints. He is still on ketoconazole and prednisone without any new side effects. He continues to enjoy excellent performance status. He did have a skin lesion removed from his scalp.  He did not report any nausea, vomiting, lower extremity edema or ecchymosis. He did not report any bony pain or worsening back pain.He does not report any dysuria or hematuria. He does report nocturia and frequency. He does not report any headaches, blurred vision or syncope. He does not report any chest pain, shortness of breath, leg edema or palpitation. He does not report any wheezing, cough or hemoptysis. Is not report any fevers, chills or weight loss. He is to be active and performs activities of daily living. He reports no nausea, vomiting, constipation, diarrhea. He does not report any skeletal complaints no arthralgias or myalgias. He is not report any lymphadenopathy or petechiae. The rest of his review of  systems unremarkable.      Medications: I have reviewed the patient's current medications.  Current Outpatient Prescriptions  Medication Sig Dispense Refill  . ketoconazole (NIZORAL) 200 MG tablet Take one tablet by mouth twice a day. 60 tablet 3  . predniSONE (DELTASONE) 5 MG tablet TAKE 1 TABLET EVERY DAY WITH BREAKFAST 30 tablet 0   No current facility-administered medications for this visit.     Allergies:  Allergies  Allergen Reactions  . Amoxicillin     Past Medical History, Surgical history, Social history, and Family History were reviewed and updated.   Physical Exam: Blood pressure 134/69, pulse 81, temperature 97.8 F (36.6 C), temperature source Oral, resp. rate 18, height 6' (1.829 m), weight 212 lb 4.8 oz (96.299 kg), SpO2 100 %. ECOG: 0 General appearance: alert and cooperative Head: Normocephalic, without obvious abnormality Neck: no adenopathy Lymph nodes: Cervical, supraclavicular, and axillary nodes normal. Heart:regular rate and rhythm, S1, S2 normal, no murmur, click, rub or gallop Lung:chest clear, no wheezing, rales, normal symmetric air entry Abdomin: soft, non-tender, without masses or organomegaly EXT:no erythema, induration, or nodules   Lab Results: Lab Results  Component Value Date   WBC 6.4 07/26/2014   HGB 14.7 07/26/2014   HCT 44.5 07/26/2014   MCV 89.0 07/26/2014   PLT 263 07/26/2014     Chemistry      Component Value Date/Time   NA 140 07/26/2014 0921   NA 139 06/22/2008 1042   K 3.9 07/26/2014 0921   K 5.1 06/22/2008 1042   CL 104 06/22/2008 1042   CO2 28 07/26/2014 0921   CO2 28 06/22/2008 1042  BUN 16.5 07/26/2014 0921   BUN 22 06/22/2008 1042   CREATININE 1.1 07/26/2014 0921   CREATININE 1.24 06/22/2008 1042      Component Value Date/Time   CALCIUM 9.1 07/26/2014 0921   CALCIUM 9.5 06/22/2008 1042   ALKPHOS 82 07/26/2014 0921   ALKPHOS 66 06/22/2008 1042   AST 21 07/26/2014 0921   AST 21 06/22/2008 1042   ALT  23 07/26/2014 0921   ALT 14 06/22/2008 1042   BILITOT 0.68 07/26/2014 0921   BILITOT 0.6 06/22/2008 1042       Results for KYRIE, FLUDD (MRN [id number]) as of 07/30/2014 16:08  Ref. Range 04/09/2014 10:37 05/10/2014 09:02 06/18/2014 08:58 07/26/2014 09:22  PSA Latest Range: <=4.00 ng/mL 17.17 (H) 12.36 (H) 11.01 (H) 10.42 (H)     Impression and Plan:  79 year-old gentleman with the following issues:  1. Prostate cancer diagnosed in 1999 with a Gleason score 3+4 equals 7 and a PSA of around 4. He is status post prostatectomy and found to have stage T3b disease. He developed a biochemical relapse and treated with intermittent androgen deprivation and currently has a rise in his PSA despite castrate levels testosterone. His CT scan on 04/12/2014 showed no evidence of any metastatic disease.   He is on ketoconazole and prednisone and tolerated it well. His PSA dropped down from 17.7 to 10.42 so far. He reports no complications from this medication and the plan is to continue on the current dose and schedule. His liver function tests and electrolytes are all within normal range.  2. Androgen depravation: He was advised to continue this under the care of [place].  3. Followup: In 5- 6 weeks to repeat laboratory testing.       Zola Button, MD 1/8/20164:05 PM

## 2014-08-02 ENCOUNTER — Telehealth: Payer: Self-pay | Admitting: Oncology

## 2014-08-02 NOTE — Telephone Encounter (Signed)
Pt called to r/s due to can't do the days that were scheduled, pt confirmed updated sch... KJ

## 2014-08-09 ENCOUNTER — Other Ambulatory Visit: Payer: Self-pay | Admitting: Oncology

## 2014-08-19 ENCOUNTER — Other Ambulatory Visit: Payer: Self-pay | Admitting: Oncology

## 2014-09-13 ENCOUNTER — Other Ambulatory Visit (HOSPITAL_BASED_OUTPATIENT_CLINIC_OR_DEPARTMENT_OTHER): Payer: Medicare Other

## 2014-09-13 DIAGNOSIS — C61 Malignant neoplasm of prostate: Secondary | ICD-10-CM

## 2014-09-13 LAB — CBC WITH DIFFERENTIAL/PLATELET
BASO%: 1.1 % (ref 0.0–2.0)
Basophils Absolute: 0.1 10*3/uL (ref 0.0–0.1)
EOS%: 6.1 % (ref 0.0–7.0)
Eosinophils Absolute: 0.4 10*3/uL (ref 0.0–0.5)
HCT: 44.2 % (ref 38.4–49.9)
HGB: 14.3 g/dL (ref 13.0–17.1)
LYMPH#: 0.9 10*3/uL (ref 0.9–3.3)
LYMPH%: 14.6 % (ref 14.0–49.0)
MCH: 29.3 pg (ref 27.2–33.4)
MCHC: 32.4 g/dL (ref 32.0–36.0)
MCV: 90.4 fL (ref 79.3–98.0)
MONO#: 0.7 10*3/uL (ref 0.1–0.9)
MONO%: 10.7 % (ref 0.0–14.0)
NEUT#: 4.4 10*3/uL (ref 1.5–6.5)
NEUT%: 67.5 % (ref 39.0–75.0)
PLATELETS: 299 10*3/uL (ref 140–400)
RBC: 4.89 10*6/uL (ref 4.20–5.82)
RDW: 13.2 % (ref 11.0–14.6)
WBC: 6.5 10*3/uL (ref 4.0–10.3)

## 2014-09-13 LAB — COMPREHENSIVE METABOLIC PANEL (CC13)
ALBUMIN: 3.8 g/dL (ref 3.5–5.0)
ALT: 17 U/L (ref 0–55)
AST: 18 U/L (ref 5–34)
Alkaline Phosphatase: 85 U/L (ref 40–150)
Anion Gap: 8 mEq/L (ref 3–11)
BUN: 16.5 mg/dL (ref 7.0–26.0)
CALCIUM: 9 mg/dL (ref 8.4–10.4)
CHLORIDE: 105 meq/L (ref 98–109)
CO2: 27 mEq/L (ref 22–29)
Creatinine: 1 mg/dL (ref 0.7–1.3)
EGFR: 74 mL/min/{1.73_m2} — ABNORMAL LOW (ref >90)
Glucose: 88 mg/dl (ref 70–140)
Potassium: 4.3 mEq/L (ref 3.5–5.1)
Sodium: 141 mEq/L (ref 136–145)
Total Bilirubin: 0.45 mg/dL (ref 0.20–1.20)
Total Protein: 6.5 g/dL (ref 6.4–8.3)

## 2014-09-14 ENCOUNTER — Other Ambulatory Visit: Payer: Medicare Other

## 2014-09-14 LAB — PSA: PSA: 8.19 ng/mL — AB (ref <=4.00)

## 2014-09-16 ENCOUNTER — Ambulatory Visit: Payer: Medicare Other | Admitting: Oncology

## 2014-09-17 ENCOUNTER — Telehealth: Payer: Self-pay | Admitting: Oncology

## 2014-09-17 ENCOUNTER — Ambulatory Visit (HOSPITAL_BASED_OUTPATIENT_CLINIC_OR_DEPARTMENT_OTHER): Payer: Medicare Other | Admitting: Oncology

## 2014-09-17 VITALS — BP 135/78 | HR 73 | Temp 97.5°F | Resp 18 | Ht 72.0 in | Wt 215.8 lb

## 2014-09-17 DIAGNOSIS — E291 Testicular hypofunction: Secondary | ICD-10-CM

## 2014-09-17 DIAGNOSIS — C61 Malignant neoplasm of prostate: Secondary | ICD-10-CM

## 2014-09-17 NOTE — Addendum Note (Signed)
Addended by: Randolm Idol on: 09/17/2014 11:07 AM   Modules accepted: Medications

## 2014-09-17 NOTE — Telephone Encounter (Signed)
Pt confirmed labs/ov per 02/26 POF, gave pt AVS..... KJ °

## 2014-09-17 NOTE — Progress Notes (Signed)
Hematology and Oncology Follow Up Visit  Edward Bailey [id number] 1936/07/23 79 y.o. 09/17/2014 10:24 AM No primary care provider on file.No ref. provider found   Principle Diagnosis: 79 year old gentleman with prostate cancer diagnosed in 1999 with a Gleason score 3+4 equals 7 and a PSA of around 4. He is currently experiencing a biochemical relapse without any measurable disease.   Prior Therapy:  He is status post prostatectomy and found to have stage T3b disease. He received salvage radiation therapy for a rise in his PSA.  He developed biochemical relapse and was treated with androgen depravation intermittently and most recently continuously with excellent response between 2010 to about 2015.  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   Current therapy: Ketoconazole at 200 mg twice a day and prednisone 5 mg daily.  Interim History:  Edward Bailey presents today for a followup visit. Since the last visit, he continues to do well. He is still on ketoconazole and prednisone and have tolerated it without any complications. He did develop a stye on his left eye which has not affected his vision. He continues to enjoy excellent performance status. He is not reporting any lower extremity edema or easy bruisability. Has not reported any GI complications. He did not report any nausea, vomiting, lower extremity edema or ecchymosis. He did not report any bony pain or worsening back pain.He does not report any dysuria or hematuria. He does report nocturia and frequency. He does not report any headaches, blurred vision or syncope. He does not report any chest pain, shortness of breath, leg edema or palpitation. He does not report any wheezing, cough or hemoptysis. Is not report any fevers, chills or weight loss. He is to be active and performs activities of daily living. He reports no nausea, vomiting, constipation, diarrhea. He does  not report any skeletal complaints no arthralgias or myalgias. He is not report any lymphadenopathy or petechiae. The rest of his review of systems unremarkable.      Medications: I have reviewed the patient's current medications.  Current Outpatient Prescriptions  Medication Sig Dispense Refill  . cetirizine (ZYRTEC) 10 MG tablet Take 10 mg by mouth daily. As needed    . ketoconazole (NIZORAL) 200 MG tablet TAKE 1 TABLET TWICE A DAY 60 tablet 3  . Multiple Vitamins-Minerals (MULTIVITAMIN PO) Take 1 capsule by mouth daily.    . predniSONE (DELTASONE) 5 MG tablet TAKE 1 TABLET EVERY DAY WITH BREAKFAST 30 tablet 0   No current facility-administered medications for this visit.     Allergies:  Allergies  Allergen Reactions  . Amoxicillin     Past Medical History, Surgical history, Social history, and Family History were reviewed and updated.   Physical Exam: Blood pressure 135/78, pulse 73, temperature 97.5 F (36.4 C), temperature source Oral, resp. rate 18, height 6' (1.829 m), weight 215 lb 12.8 oz (97.886 kg), SpO2 99 %. ECOG: 0 General appearance: alert and cooperative Head: Normocephalic, without obvious abnormality Neck: no adenopathy Lymph nodes: Cervical, supraclavicular, and axillary nodes normal. Heart:regular rate and rhythm, S1, S2 normal, no murmur, click, rub or gallop Lung:chest clear, no wheezing, rales, normal symmetric air entry Abdomin: soft, non-tender, without masses or organomegaly EXT:no erythema, induration, or nodules   Lab Results: Lab Results  Component Value Date   WBC 6.5 09/13/2014   HGB 14.3 09/13/2014   HCT 44.2 09/13/2014   MCV 90.4 09/13/2014   PLT 299 09/13/2014  Chemistry      Component Value Date/Time   NA 141 09/13/2014 1011   NA 139 06/22/2008 1042   K 4.3 09/13/2014 1011   K 5.1 06/22/2008 1042   CL 104 06/22/2008 1042   CO2 27 09/13/2014 1011   CO2 28 06/22/2008 1042   BUN 16.5 09/13/2014 1011   BUN 22 06/22/2008  1042   CREATININE 1.0 09/13/2014 1011   CREATININE 1.24 06/22/2008 1042      Component Value Date/Time   CALCIUM 9.0 09/13/2014 1011   CALCIUM 9.5 06/22/2008 1042   ALKPHOS 85 09/13/2014 1011   ALKPHOS 66 06/22/2008 1042   AST 18 09/13/2014 1011   AST 21 06/22/2008 1042   ALT 17 09/13/2014 1011   ALT 14 06/22/2008 1042   BILITOT 0.45 09/13/2014 1011   BILITOT 0.6 06/22/2008 1042     Results for Edward, Bailey (MRN [id number]) as of 09/17/2014 10:26  Ref. Range 06/22/2008 10:42 04/09/2014 10:37 05/10/2014 09:02 06/18/2014 08:58 07/26/2014 09:22 09/13/2014 10:10  PSA Latest Range: <=4.00 ng/mL 9.45 (H) 17.17 (H) 12.36 (H) 11.01 (H) 10.42 (H) 8.19 (H)      Impression and Plan:  79 year-old gentleman with the following issues:  1. Prostate cancer diagnosed in 1999 with a Gleason score 3+4 equals 7 and a PSA of around 4. He is status post prostatectomy and found to have stage T3b disease. He developed a biochemical relapse and treated with intermittent androgen deprivation and currently has a rise in his PSA despite castrate levels testosterone. His CT scan on 04/12/2014 showed no evidence of any metastatic disease.   He is on ketoconazole and prednisone and tolerated it well. His PSA dropped down from 17.7 to 8.19 so far. He reports no complications from this medication and the plan is to continue on the current dose and schedule. His liver function tests and electrolytes are all within normal range and we'll continue to monitor this with every visit.  2. Androgen depravation: He was advised to continue this under the care of Edward Bailey.  3. Followup: In 6-8  weeks to repeat laboratory testing.       [facility], MD 2/26/201610:24 AM

## 2014-09-20 ENCOUNTER — Other Ambulatory Visit: Payer: Self-pay | Admitting: Oncology

## 2014-10-20 ENCOUNTER — Other Ambulatory Visit: Payer: Self-pay | Admitting: Oncology

## 2014-11-12 ENCOUNTER — Other Ambulatory Visit: Payer: Medicare Other

## 2014-11-19 ENCOUNTER — Other Ambulatory Visit (HOSPITAL_BASED_OUTPATIENT_CLINIC_OR_DEPARTMENT_OTHER): Payer: Medicare Other

## 2014-11-19 DIAGNOSIS — C61 Malignant neoplasm of prostate: Secondary | ICD-10-CM | POA: Diagnosis not present

## 2014-11-19 LAB — CBC WITH DIFFERENTIAL/PLATELET
BASO%: 0.4 % (ref 0.0–2.0)
BASOS ABS: 0 10*3/uL (ref 0.0–0.1)
EOS%: 9 % — ABNORMAL HIGH (ref 0.0–7.0)
Eosinophils Absolute: 0.5 10*3/uL (ref 0.0–0.5)
HCT: 41.3 % (ref 38.4–49.9)
HGB: 13.9 g/dL (ref 13.0–17.1)
LYMPH%: 18.4 % (ref 14.0–49.0)
MCH: 30 pg (ref 27.2–33.4)
MCHC: 33.7 g/dL (ref 32.0–36.0)
MCV: 89.2 fL (ref 79.3–98.0)
MONO#: 0.6 10*3/uL (ref 0.1–0.9)
MONO%: 12.3 % (ref 0.0–14.0)
NEUT#: 3.1 10*3/uL (ref 1.5–6.5)
NEUT%: 59.9 % (ref 39.0–75.0)
Platelets: 248 10*3/uL (ref 140–400)
RBC: 4.63 10*6/uL (ref 4.20–5.82)
RDW: 12.6 % (ref 11.0–14.6)
WBC: 5.2 10*3/uL (ref 4.0–10.3)
lymph#: 1 10*3/uL (ref 0.9–3.3)

## 2014-11-19 LAB — COMPREHENSIVE METABOLIC PANEL (CC13)
ALT: 19 U/L (ref 0–55)
AST: 16 U/L (ref 5–34)
Albumin: 3.5 g/dL (ref 3.5–5.0)
Alkaline Phosphatase: 79 U/L (ref 40–150)
Anion Gap: 10 meq/L (ref 3–11)
BUN: 23.2 mg/dL (ref 7.0–26.0)
CO2: 23 meq/L (ref 22–29)
Calcium: 8.4 mg/dL (ref 8.4–10.4)
Chloride: 108 meq/L (ref 98–109)
Creatinine: 1.1 mg/dL (ref 0.7–1.3)
EGFR: 68 ml/min/1.73 m2 — ABNORMAL LOW
Glucose: 91 mg/dL (ref 70–140)
Potassium: 4 meq/L (ref 3.5–5.1)
Sodium: 141 meq/L (ref 136–145)
Total Bilirubin: 0.48 mg/dL (ref 0.20–1.20)
Total Protein: 5.9 g/dL — ABNORMAL LOW (ref 6.4–8.3)

## 2014-11-21 LAB — PSA: PSA: 5.09 ng/mL — AB (ref <=4.00)

## 2014-11-22 ENCOUNTER — Other Ambulatory Visit: Payer: Self-pay | Admitting: Oncology

## 2014-11-26 ENCOUNTER — Ambulatory Visit (HOSPITAL_BASED_OUTPATIENT_CLINIC_OR_DEPARTMENT_OTHER): Payer: Medicare Other | Admitting: Oncology

## 2014-11-26 ENCOUNTER — Telehealth: Payer: Self-pay | Admitting: Oncology

## 2014-11-26 VITALS — BP 139/54 | HR 81 | Resp 18 | Ht 72.0 in | Wt 216.2 lb

## 2014-11-26 DIAGNOSIS — C61 Malignant neoplasm of prostate: Secondary | ICD-10-CM | POA: Diagnosis not present
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
## 2014-11-26 NOTE — Telephone Encounter (Signed)
Gave avs & calendar for June/July.  °

## 2014-12-23 ENCOUNTER — Other Ambulatory Visit: Payer: Self-pay | Admitting: Oncology

## 2014-12-24 ENCOUNTER — Other Ambulatory Visit: Payer: Self-pay | Admitting: Oncology

## 2015-01-17 ENCOUNTER — Other Ambulatory Visit: Payer: Self-pay

## 2015-01-17 ENCOUNTER — Other Ambulatory Visit (HOSPITAL_BASED_OUTPATIENT_CLINIC_OR_DEPARTMENT_OTHER): Payer: Medicare Other

## 2015-01-17 DIAGNOSIS — E291 Testicular hypofunction: Secondary | ICD-10-CM | POA: Diagnosis not present

## 2015-01-17 DIAGNOSIS — C61 Malignant neoplasm of prostate: Secondary | ICD-10-CM

## 2015-01-17 LAB — CBC WITH DIFFERENTIAL/PLATELET
BASO%: 0.3 % (ref 0.0–2.0)
Basophils Absolute: 0 10*3/uL (ref 0.0–0.1)
EOS%: 11.5 % — AB (ref 0.0–7.0)
Eosinophils Absolute: 0.8 10*3/uL — ABNORMAL HIGH (ref 0.0–0.5)
HCT: 42.7 % (ref 38.4–49.9)
HEMOGLOBIN: 14.5 g/dL (ref 13.0–17.1)
LYMPH%: 20.9 % (ref 14.0–49.0)
MCH: 30 pg (ref 27.2–33.4)
MCHC: 34 g/dL (ref 32.0–36.0)
MCV: 88.2 fL (ref 79.3–98.0)
MONO#: 0.5 10*3/uL (ref 0.1–0.9)
MONO%: 8 % (ref 0.0–14.0)
NEUT#: 3.9 10*3/uL (ref 1.5–6.5)
NEUT%: 59.3 % (ref 39.0–75.0)
PLATELETS: 265 10*3/uL (ref 140–400)
RBC: 4.84 10*6/uL (ref 4.20–5.82)
RDW: 12.7 % (ref 11.0–14.6)
WBC: 6.5 10*3/uL (ref 4.0–10.3)
lymph#: 1.4 10*3/uL (ref 0.9–3.3)

## 2015-01-17 LAB — COMPREHENSIVE METABOLIC PANEL (CC13)
ALK PHOS: 72 U/L (ref 40–150)
ALT: 21 U/L (ref 0–55)
AST: 18 U/L (ref 5–34)
Albumin: 3.7 g/dL (ref 3.5–5.0)
Anion Gap: 8 mEq/L (ref 3–11)
BILIRUBIN TOTAL: 0.52 mg/dL (ref 0.20–1.20)
BUN: 18.8 mg/dL (ref 7.0–26.0)
CO2: 27 mEq/L (ref 22–29)
CREATININE: 1.2 mg/dL (ref 0.7–1.3)
Calcium: 8.9 mg/dL (ref 8.4–10.4)
Chloride: 107 mEq/L (ref 98–109)
EGFR: 57 mL/min/{1.73_m2} — AB (ref >90)
Glucose: 179 mg/dl — ABNORMAL HIGH (ref 70–140)
POTASSIUM: 3.9 meq/L (ref 3.5–5.1)
Sodium: 142 mEq/L (ref 136–145)
Total Protein: 6.2 g/dL — ABNORMAL LOW (ref 6.4–8.3)

## 2015-01-18 LAB — PSA: PSA: 3.8 ng/mL (ref <=4.00)

## 2015-01-20 ENCOUNTER — Other Ambulatory Visit: Payer: Self-pay | Admitting: Oncology

## 2015-01-21 ENCOUNTER — Telehealth: Payer: Self-pay | Admitting: Oncology

## 2015-01-21 ENCOUNTER — Ambulatory Visit (HOSPITAL_BASED_OUTPATIENT_CLINIC_OR_DEPARTMENT_OTHER): Payer: Medicare Other | Admitting: Oncology

## 2015-01-21 VITALS — BP 142/67 | HR 71 | Temp 97.5°F | Resp 20 | Ht 72.0 in | Wt 220.9 lb

## 2015-01-21 DIAGNOSIS — C61 Malignant neoplasm of prostate: Secondary | ICD-10-CM | POA: Diagnosis not present

## 2015-01-21 NOTE — Telephone Encounter (Signed)
Patient called to r/s f/u appointment due to appointment scheduled with wrong provider (corrected). Also patient mentioned not being able to get the dates he needed at check out. Patient needs appointments to be on Mondays and fridays. Lab for Monday 8/29 remains the same f/u on 9/1 moved to Friday 9/2. Patient has new date/time for f/u and will keep lab as scheduled.

## 2015-01-21 NOTE — Telephone Encounter (Signed)
per pof to sch pt appt-gave pt copy of avs-[facility] sch appt 9/89 but on vacation-sch pt 9/1

## 2015-01-21 NOTE — Progress Notes (Signed)
Hematology and Oncology Follow Up Visit  Edward Bailey [id number] 02-26-1936 79 y.o. 01/21/2015 10:44 AM No primary care provider on file.No ref. provider found   Principle Diagnosis: 79 year old gentleman with prostate cancer diagnosed in 1999 with a Gleason score 3+4 equals 7 and a PSA of around 4. He is currently experiencing a biochemical relapse without any measurable disease.   Prior Therapy:  He is status post prostatectomy and found to have stage T3b disease. He received salvage radiation therapy for a rise in his PSA.  He developed biochemical relapse and was treated with androgen depravation intermittently and most recently continuously with excellent response between 2010 to about 2015.  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   Current therapy: Ketoconazole at 200 mg twice a day and prednisone 5 mg daily.  Interim History:  Edward Bailey presents today for a followup visit. Since the last visit, he does not report any new complications. He is still on ketoconazole and prednisone and have tolerated it without any complications. He continues to enjoy excellent performance status. He is not reporting any lower extremity edema or easy bruisability. He did report some occasional dyspepsia especially at nighttime. He does take prednisone He did not report any nausea, vomiting, lower extremity edema or ecchymosis. He did not report any bony pain or worsening back pain.He does not report any dysuria or hematuria. He does report nocturia and frequency. He does not report any headaches, blurred vision or syncope. He does not report any chest pain, shortness of breath, leg edema or palpitation. He does not report any wheezing, cough or hemoptysis. Is not report any fevers, chills or weight loss. He is to be active and performs activities of daily living. He reports no nausea, vomiting, constipation, diarrhea. He does not  report any skeletal complaints no arthralgias or myalgias. He is not report any lymphadenopathy or petechiae. The rest of his review of systems unremarkable.      Medications: I have reviewed the patient's current medications.  Current Outpatient Prescriptions  Medication Sig Dispense Refill  . cetirizine (ZYRTEC) 10 MG tablet Take 10 mg by mouth daily. As needed    . ketoconazole (NIZORAL) 200 MG tablet TAKE 1 TABLET TWICE A DAY 60 tablet 3  . Multiple Vitamins-Minerals (MULTIVITAMIN PO) Take 1 capsule by mouth daily.    . predniSONE (DELTASONE) 5 MG tablet TAKE 1 TABLET EVERY DAY WITH BREAKFAST 30 tablet 0   No current facility-administered medications for this visit.     Allergies:  Allergies  Allergen Reactions  . Amoxicillin Rash    Past Medical History, Surgical history, Social history, and Family History were reviewed and updated.   Physical Exam: Blood pressure 142/67, pulse 71, temperature 97.5 F (36.4 C), temperature source Oral, resp. rate 20, height 6' (1.829 m), weight 220 lb 14.4 oz (100.2 kg), SpO2 99 %. ECOG: 0 General appearance: alert and cooperative. Not in any distress. Head: Normocephalic, without obvious abnormality Neck: no adenopathy Lymph nodes: Cervical, supraclavicular, and axillary nodes normal. Heart:regular rate and rhythm, S1, S2 normal, no murmur, click, rub or gallop Lung:chest clear, no wheezing, rales, normal symmetric air entry Abdomin: soft, non-tender, without masses or organomegaly EXT:no erythema, induration, or nodules.  Skin: Small bruising noted on his upper extremities.   Lab Results: Lab Results  Component Value Date   WBC 6.5 01/17/2015   HGB 14.5 01/17/2015   HCT 42.7 01/17/2015   MCV 88.2 01/17/2015  PLT 265 01/17/2015     Chemistry      Component Value Date/Time   NA 142 01/17/2015 0926   NA 139 06/22/2008 1042   K 3.9 01/17/2015 0926   K 5.1 06/22/2008 1042   CL 104 06/22/2008 1042   CO2 27 01/17/2015 0926    CO2 28 06/22/2008 1042   BUN 18.8 01/17/2015 0926   BUN 22 06/22/2008 1042   CREATININE 1.2 01/17/2015 0926   CREATININE 1.24 06/22/2008 1042      Component Value Date/Time   CALCIUM 8.9 01/17/2015 0926   CALCIUM 9.5 06/22/2008 1042   ALKPHOS 72 01/17/2015 0926   ALKPHOS 66 06/22/2008 1042   AST 18 01/17/2015 0926   AST 21 06/22/2008 1042   ALT 21 01/17/2015 0926   ALT 14 06/22/2008 1042   BILITOT 0.52 01/17/2015 0926   BILITOT 0.6 06/22/2008 1042       Results for [name], [name] (MRN [id number]) as of 01/21/2015 10:08  Ref. Range 09/13/2014 10:10 11/19/2014 08:58 01/17/2015 09:26  PSA Latest Ref Range: <=4.00 ng/mL 8.19 (H) 5.09 (H) 3.80      Impression and Plan:  79 year-old gentleman with the following issues:  1. Prostate cancer diagnosed in 1999 with a Gleason score 3+4 equals 7 and a PSA of around 4. He is status post prostatectomy and found to have stage T3b disease. He developed a biochemical relapse and treated with intermittent androgen deprivation and currently has a rise in his PSA despite castrate levels testosterone. His CT scan on 04/12/2014 showed no evidence of any metastatic disease.   He is on ketoconazole and prednisone and tolerated it well. His PSA continued to decline on this therapy without any major complications from this medication. Stress PSA is down to 3.8 and the plan is to continue with the same dose and schedule.  2. Androgen depravation: He was advised to continue this under the care of [place].  3. Followup: In 8 to 9  weeks to repeat laboratory testing.       [facility], MD 7/1/201610:44 AM

## 2015-02-21 ENCOUNTER — Other Ambulatory Visit: Payer: Self-pay | Admitting: Oncology

## 2015-03-21 ENCOUNTER — Other Ambulatory Visit (HOSPITAL_BASED_OUTPATIENT_CLINIC_OR_DEPARTMENT_OTHER): Payer: Medicare Other

## 2015-03-21 DIAGNOSIS — C61 Malignant neoplasm of prostate: Secondary | ICD-10-CM

## 2015-03-21 LAB — CBC WITH DIFFERENTIAL/PLATELET
BASO%: 1 % (ref 0.0–2.0)
Basophils Absolute: 0.1 10*3/uL (ref 0.0–0.1)
EOS%: 9.6 % — ABNORMAL HIGH (ref 0.0–7.0)
Eosinophils Absolute: 0.5 10*3/uL (ref 0.0–0.5)
HEMATOCRIT: 41.2 % (ref 38.4–49.9)
HEMOGLOBIN: 14.3 g/dL (ref 13.0–17.1)
LYMPH#: 0.9 10*3/uL (ref 0.9–3.3)
LYMPH%: 17.7 % (ref 14.0–49.0)
MCH: 30.4 pg (ref 27.2–33.4)
MCHC: 34.6 g/dL (ref 32.0–36.0)
MCV: 87.7 fL (ref 79.3–98.0)
MONO#: 0.6 10*3/uL (ref 0.1–0.9)
MONO%: 11.7 % (ref 0.0–14.0)
NEUT#: 3 10*3/uL (ref 1.5–6.5)
NEUT%: 60 % (ref 39.0–75.0)
PLATELETS: 252 10*3/uL (ref 140–400)
RBC: 4.7 10*6/uL (ref 4.20–5.82)
RDW: 12.4 % (ref 11.0–14.6)
WBC: 5 10*3/uL (ref 4.0–10.3)

## 2015-03-21 LAB — COMPREHENSIVE METABOLIC PANEL (CC13)
ALBUMIN: 3.5 g/dL (ref 3.5–5.0)
ALK PHOS: 82 U/L (ref 40–150)
ALT: 18 U/L (ref 0–55)
ANION GAP: 8 meq/L (ref 3–11)
AST: 18 U/L (ref 5–34)
BILIRUBIN TOTAL: 0.61 mg/dL (ref 0.20–1.20)
BUN: 18.9 mg/dL (ref 7.0–26.0)
CALCIUM: 9 mg/dL (ref 8.4–10.4)
CO2: 25 mEq/L (ref 22–29)
Chloride: 107 mEq/L (ref 98–109)
Creatinine: 1 mg/dL (ref 0.7–1.3)
EGFR: 69 mL/min/{1.73_m2} — ABNORMAL LOW (ref >90)
Glucose: 162 mg/dl — ABNORMAL HIGH (ref 70–140)
Potassium: 4.2 mEq/L (ref 3.5–5.1)
Sodium: 139 mEq/L (ref 136–145)
TOTAL PROTEIN: 6 g/dL — AB (ref 6.4–8.3)

## 2015-03-22 LAB — PSA: PSA: 3.76 ng/mL (ref <=4.00)

## 2015-03-24 ENCOUNTER — Ambulatory Visit: Payer: Medicare Other | Admitting: Oncology

## 2015-03-24 ENCOUNTER — Other Ambulatory Visit: Payer: Medicare Other

## 2015-03-24 ENCOUNTER — Other Ambulatory Visit: Payer: Self-pay | Admitting: Oncology

## 2015-03-25 ENCOUNTER — Telehealth: Payer: Self-pay | Admitting: Oncology

## 2015-03-25 ENCOUNTER — Ambulatory Visit (HOSPITAL_BASED_OUTPATIENT_CLINIC_OR_DEPARTMENT_OTHER): Payer: Medicare Other | Admitting: Oncology

## 2015-03-25 VITALS — BP 152/69 | HR 78 | Temp 97.8°F | Resp 18 | Ht 72.0 in | Wt 213.0 lb

## 2015-03-25 DIAGNOSIS — C61 Malignant neoplasm of prostate: Secondary | ICD-10-CM

## 2015-03-25 DIAGNOSIS — R109 Unspecified abdominal pain: Secondary | ICD-10-CM

## 2015-03-25 DIAGNOSIS — E291 Testicular hypofunction: Secondary | ICD-10-CM | POA: Diagnosis not present

## 2015-03-25 NOTE — Telephone Encounter (Signed)
per pof to sch pt appt-gave pt copy of avs-gave pt contrast-[facility] to sch-gave pt # to [facility]

## 2015-03-25 NOTE — Progress Notes (Signed)
Hematology and Oncology Follow Up Visit  Edward Bailey [id number] 1936-03-04 79 y.o. 03/25/2015 9:38 AM No primary care provider on file.No ref. provider found   Principle Diagnosis: 79 year old gentleman with prostate cancer diagnosed in 1999 with a Gleason score 3+4 equals 7 and a PSA of around 4. He is currently experiencing a biochemical relapse without any measurable disease.   Prior Therapy:  He is status post prostatectomy and found to have stage T3b disease. He received salvage radiation therapy for a rise in his PSA.  He developed biochemical relapse and was treated with androgen depravation intermittently and most recently continuously with excellent response between 2010 to about 2015.  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   Current therapy: Ketoconazole at 200 mg twice a day and prednisone 5 mg daily.  Interim History:  Edward Bailey presents today for a followup visit. Since the last visit, he has reported a few issues. In the last 3-4 weeks have reported abdominal discomfort that he describes as a soreness. He is not reporting any nausea, vomiting, diarrhea or dyspepsia. He has reported some fatigue associated with his stomach discomfort. He stopped the prednisone and today he feels slightly better. He was evaluated by his primary care physician and his evaluation was unrevealing. He is scheduled to have a colonoscopy in the near future as a part of her routine screening. He is still on ketoconazole and does not report any other side effects associated with it. He continues to enjoy excellent performance status. He is not reporting any lower extremity edema or easy bruisability.   He did not report any nausea, vomiting, lower extremity edema or ecchymosis. He did not report any bony pain or worsening back pain.He does not report any dysuria or hematuria. He does report nocturia and frequency. He does not  report any headaches, blurred vision or syncope. He does not report any chest pain, shortness of breath, leg edema or palpitation. He does not report any wheezing, cough or hemoptysis. Is not report any fevers, chills or weight loss. He is to be active and performs activities of daily living. He reports no nausea, vomiting, constipation, diarrhea. He does not report any skeletal complaints no arthralgias or myalgias. He is not report any lymphadenopathy or petechiae. The rest of his review of systems unremarkable.      Medications: I have reviewed the patient's current medications.  Current Outpatient Prescriptions  Medication Sig Dispense Refill  . cetirizine (ZYRTEC) 10 MG tablet Take 10 mg by mouth daily. As needed    . ketoconazole (NIZORAL) 200 MG tablet TAKE 1 TABLET TWICE A DAY 60 tablet 3  . Multiple Vitamins-Minerals (MULTIVITAMIN PO) Take 1 capsule by mouth daily.    . predniSONE (DELTASONE) 5 MG tablet TAKE 1 TABLET EVERY DAY WITH BREAKFAST 30 tablet 0   No current facility-administered medications for this visit.     Allergies:  Allergies  Allergen Reactions  . Amoxicillin Rash    Past Medical History, Surgical history, Social history, and Family History were reviewed and updated.   Physical Exam: Blood pressure 152/69, pulse 78, temperature 97.8 F (36.6 C), temperature source Oral, resp. rate 18, height 6' (1.829 m), weight 213 lb (96.616 kg), SpO2 99 %. ECOG: 0 General appearance: alert and cooperative. Well-appearing gentleman without distress. Head: Normocephalic, without obvious abnormality Neck: no adenopathy Lymph nodes: Cervical, supraclavicular, and axillary nodes normal. Heart:regular rate and rhythm, S1, S2 normal, no murmur, click, rub  or gallop Lung:chest clear, no wheezing, rales, normal symmetric air entry Abdomin: soft, non-tender, without masses or organomegaly. No rebound or guarding. No shifting dullness. EXT:no erythema, induration, or nodules.   Skin: Small bruising noted on his upper extremities.   Lab Results: Lab Results  Component Value Date   WBC 5.0 03/21/2015   HGB 14.3 03/21/2015   HCT 41.2 03/21/2015   MCV 87.7 03/21/2015   PLT 252 03/21/2015     Chemistry      Component Value Date/Time   NA 139 03/21/2015 0921   NA 139 06/22/2008 1042   K 4.2 03/21/2015 0921   K 5.1 06/22/2008 1042   CL 104 06/22/2008 1042   CO2 25 03/21/2015 0921   CO2 28 06/22/2008 1042   BUN 18.9 03/21/2015 0921   BUN 22 06/22/2008 1042   CREATININE 1.0 03/21/2015 0921   CREATININE 1.24 06/22/2008 1042      Component Value Date/Time   CALCIUM 9.0 03/21/2015 0921   CALCIUM 9.5 06/22/2008 1042   ALKPHOS 82 03/21/2015 0921   ALKPHOS 66 06/22/2008 1042   AST 18 03/21/2015 0921   AST 21 06/22/2008 1042   ALT 18 03/21/2015 0921   ALT 14 06/22/2008 1042   BILITOT 0.61 03/21/2015 0921   BILITOT 0.6 06/22/2008 1042        Results for Edward Bailey, Edward Bailey (MRN [id number]) as of 03/25/2015 09:07  Ref. Range 11/19/2014 08:58 01/17/2015 09:26 03/21/2015 09:21  PSA Latest Ref Range: <=4.00 ng/mL 5.09 (H) 3.80 3.76      Impression and Plan:  79 year-old gentleman with the following issues:  1. Prostate cancer diagnosed in 1999 with a Gleason score 3+4 equals 7 and a PSA of around 4. He is status post prostatectomy and found to have stage T3b disease. He developed a biochemical relapse and treated with intermittent androgen deprivation and currently has a rise in his PSA despite castrate levels testosterone. His CT scan on 04/12/2014 showed no evidence of any metastatic disease.   He is on ketoconazole and prednisone and tolerated it well. His PSA continues to be stable in the last 2 months and overall decline from 17.1 in September 2015. I have recommended continuing on the same dose and schedule and we will repeat imaging studies for staging purposes.  He is currently off prednisone for GI issues. Once these resolve, I have encouraged him  to go back on prednisone to alleviate any adrenal complications associated with ketoconazole.  2. Androgen depravation: He was advised to continue this under the care of [place].  3. Abdominal discomfort: Unclear etiology but could be related to the prednisone or possibly ketoconazole. We have to rule out any malignant causes of his discomfort. He will have a repeat a CT scan in the near future to determine any malignant causes. He is also scheduled for a colonoscopy in any case as a part of his routine screening. I encouraged him to go through without at this time.  4. Followup: In 8  weeks to repeat laboratory testing. This will be sooner if his CT scan show any abnormalities.      [facility], MD 9/2/20169:38 AM

## 2015-03-29 ENCOUNTER — Ambulatory Visit (HOSPITAL_COMMUNITY)
Admission: RE | Admit: 2015-03-29 | Discharge: 2015-03-29 | Disposition: A | Discharge status: Home / Self Care | Payer: Medicare Other | Source: Ambulatory Visit | Attending: Oncology | Admitting: Oncology

## 2015-03-29 ENCOUNTER — Telehealth: Payer: Self-pay | Admitting: *Deleted

## 2015-03-29 DIAGNOSIS — C61 Malignant neoplasm of prostate: Secondary | ICD-10-CM | POA: Diagnosis present

## 2015-03-29 DIAGNOSIS — K5730 Diverticulosis of large intestine without perforation or abscess without bleeding: Secondary | ICD-10-CM | POA: Diagnosis not present

## 2015-03-29 MED ORDER — IOHEXOL 300 MG/ML  SOLN
100.0000 mL | Freq: Once | INTRAMUSCULAR | Status: AC | PRN
  Administered 2015-03-29: 100 mL via INTRAVENOUS

## 2015-03-29 NOTE — Progress Notes (Signed)
Spoke with patient, per dr Alen Blew,  gave results of  Latest scan

## 2015-03-29 NOTE — Telephone Encounter (Signed)
Lm for patient to call me, re: scan results

## 2015-03-29 NOTE — Telephone Encounter (Signed)
-----   Message from Wyatt Portela, MD sent at 03/29/2015 11:27 AM EDT ----- Please let him know that his scan is normal.

## 2015-04-14 ENCOUNTER — Other Ambulatory Visit: Payer: Self-pay | Admitting: Gastroenterology

## 2015-05-03 ENCOUNTER — Ambulatory Visit
Admission: RE | Admit: 2015-05-03 | Discharge: 2015-05-03 | Disposition: A | Discharge status: Home / Self Care | Payer: Medicare Other | Source: Ambulatory Visit | Attending: Internal Medicine | Admitting: Internal Medicine

## 2015-05-03 ENCOUNTER — Other Ambulatory Visit: Payer: Self-pay | Admitting: Internal Medicine

## 2015-05-03 DIAGNOSIS — R0600 Dyspnea, unspecified: Secondary | ICD-10-CM

## 2015-05-03 DIAGNOSIS — R0609 Other forms of dyspnea: Principal | ICD-10-CM

## 2015-05-04 ENCOUNTER — Other Ambulatory Visit: Payer: Self-pay | Admitting: Internal Medicine

## 2015-05-04 DIAGNOSIS — E0590 Thyrotoxicosis, unspecified without thyrotoxic crisis or storm: Secondary | ICD-10-CM

## 2015-05-05 ENCOUNTER — Other Ambulatory Visit: Payer: Medicare Other

## 2015-05-06 ENCOUNTER — Encounter: Payer: Self-pay | Admitting: Oncology

## 2015-05-06 ENCOUNTER — Other Ambulatory Visit: Payer: Self-pay | Admitting: Oncology

## 2015-05-06 ENCOUNTER — Ambulatory Visit
Admission: RE | Admit: 2015-05-06 | Discharge: 2015-05-06 | Disposition: A | Discharge status: Home / Self Care | Payer: Medicare Other | Source: Ambulatory Visit | Attending: Internal Medicine | Admitting: Internal Medicine

## 2015-05-06 DIAGNOSIS — E0590 Thyrotoxicosis, unspecified without thyrotoxic crisis or storm: Secondary | ICD-10-CM

## 2015-05-10 ENCOUNTER — Other Ambulatory Visit (HOSPITAL_COMMUNITY): Payer: Self-pay | Admitting: Internal Medicine

## 2015-05-10 ENCOUNTER — Encounter: Payer: Self-pay | Admitting: Oncology

## 2015-05-10 DIAGNOSIS — E0590 Thyrotoxicosis, unspecified without thyrotoxic crisis or storm: Secondary | ICD-10-CM

## 2015-05-12 ENCOUNTER — Encounter: Payer: Self-pay | Admitting: Oncology

## 2015-05-12 ENCOUNTER — Encounter: Payer: Self-pay | Admitting: *Deleted

## 2015-05-12 ENCOUNTER — Telehealth: Payer: Self-pay | Admitting: *Deleted

## 2015-05-12 NOTE — Telephone Encounter (Signed)
No note

## 2015-05-12 NOTE — Progress Notes (Signed)
Spoke with patient, regarding the ketoconazole affecting his thyroid function. Per dr Alen Blew, it is less likely.he will discuss further at next visit 05/26/15. Patient verbalized understanding.

## 2015-05-12 NOTE — Telephone Encounter (Signed)
Lm on ans machine, returning patient's call.

## 2015-05-18 ENCOUNTER — Ambulatory Visit (HOSPITAL_COMMUNITY)
Admission: RE | Admit: 2015-05-18 | Discharge: 2015-05-18 | Disposition: A | Discharge status: Home / Self Care | Payer: Medicare Other | Source: Ambulatory Visit | Attending: Internal Medicine | Admitting: Internal Medicine

## 2015-05-18 DIAGNOSIS — E0590 Thyrotoxicosis, unspecified without thyrotoxic crisis or storm: Secondary | ICD-10-CM

## 2015-05-19 ENCOUNTER — Encounter (HOSPITAL_COMMUNITY)
Admission: RE | Admit: 2015-05-19 | Discharge: 2015-05-19 | Disposition: A | Discharge status: Home / Self Care | Payer: Medicare Other | Source: Ambulatory Visit | Attending: Internal Medicine | Admitting: Internal Medicine

## 2015-05-19 DIAGNOSIS — E0590 Thyrotoxicosis, unspecified without thyrotoxic crisis or storm: Secondary | ICD-10-CM | POA: Diagnosis not present

## 2015-05-19 MED ORDER — SODIUM IODIDE I 131 CAPSULE
10.7000 | Freq: Once | INTRAVENOUS | Status: AC | PRN
  Administered 2015-05-19: 10.7 via ORAL

## 2015-05-19 MED ORDER — SODIUM PERTECHNETATE TC 99M INJECTION
10.0000 | Freq: Once | INTRAVENOUS | Status: AC | PRN
  Administered 2015-05-19: 10 via INTRAVENOUS

## 2015-05-20 ENCOUNTER — Other Ambulatory Visit (HOSPITAL_BASED_OUTPATIENT_CLINIC_OR_DEPARTMENT_OTHER): Payer: Medicare Other

## 2015-05-20 DIAGNOSIS — C61 Malignant neoplasm of prostate: Secondary | ICD-10-CM | POA: Diagnosis not present

## 2015-05-20 LAB — CBC WITH DIFFERENTIAL/PLATELET
BASO%: 0.8 % (ref 0.0–2.0)
BASOS ABS: 0.1 10*3/uL (ref 0.0–0.1)
EOS ABS: 0.4 10*3/uL (ref 0.0–0.5)
EOS%: 6.3 % (ref 0.0–7.0)
HEMATOCRIT: 41.1 % (ref 38.4–49.9)
HGB: 13.8 g/dL (ref 13.0–17.1)
LYMPH#: 1.2 10*3/uL (ref 0.9–3.3)
LYMPH%: 16.8 % (ref 14.0–49.0)
MCH: 29.3 pg (ref 27.2–33.4)
MCHC: 33.6 g/dL (ref 32.0–36.0)
MCV: 87.1 fL (ref 79.3–98.0)
MONO#: 0.8 10*3/uL (ref 0.1–0.9)
MONO%: 11.1 % (ref 0.0–14.0)
NEUT#: 4.6 10*3/uL (ref 1.5–6.5)
NEUT%: 65 % (ref 39.0–75.0)
PLATELETS: 245 10*3/uL (ref 140–400)
RBC: 4.72 10*6/uL (ref 4.20–5.82)
RDW: 12.8 % (ref 11.0–14.6)
WBC: 7.1 10*3/uL (ref 4.0–10.3)

## 2015-05-20 LAB — COMPREHENSIVE METABOLIC PANEL (CC13)
ALBUMIN: 3.3 g/dL — AB (ref 3.5–5.0)
ALK PHOS: 71 U/L (ref 40–150)
ALT: 31 U/L (ref 0–55)
AST: 19 U/L (ref 5–34)
Anion Gap: 8 mEq/L (ref 3–11)
BUN: 25.1 mg/dL (ref 7.0–26.0)
CO2: 25 mEq/L (ref 22–29)
Calcium: 9 mg/dL (ref 8.4–10.4)
Chloride: 109 mEq/L (ref 98–109)
Creatinine: 0.9 mg/dL (ref 0.7–1.3)
EGFR: 80 mL/min/{1.73_m2} — ABNORMAL LOW (ref >90)
Glucose: 76 mg/dl (ref 70–140)
POTASSIUM: 4.2 meq/L (ref 3.5–5.1)
SODIUM: 143 meq/L (ref 136–145)
TOTAL PROTEIN: 5.9 g/dL — AB (ref 6.4–8.3)
Total Bilirubin: 0.39 mg/dL (ref 0.20–1.20)

## 2015-05-21 LAB — PSA: PSA: 2.46 ng/mL (ref <=4.00)

## 2015-05-23 ENCOUNTER — Encounter (HOSPITAL_COMMUNITY): Payer: Medicare Other

## 2015-05-24 ENCOUNTER — Other Ambulatory Visit (HOSPITAL_COMMUNITY): Payer: Medicare Other

## 2015-05-24 ENCOUNTER — Encounter: Payer: Self-pay | Admitting: Oncology

## 2015-05-25 ENCOUNTER — Other Ambulatory Visit: Payer: Self-pay | Admitting: Internal Medicine

## 2015-05-25 DIAGNOSIS — E041 Nontoxic single thyroid nodule: Secondary | ICD-10-CM

## 2015-05-26 ENCOUNTER — Telehealth: Payer: Self-pay | Admitting: Oncology

## 2015-05-26 ENCOUNTER — Ambulatory Visit (HOSPITAL_BASED_OUTPATIENT_CLINIC_OR_DEPARTMENT_OTHER): Payer: Medicare Other | Admitting: Oncology

## 2015-05-26 ENCOUNTER — Other Ambulatory Visit (HOSPITAL_COMMUNITY)
Admission: RE | Admit: 2015-05-26 | Discharge: 2015-05-26 | Disposition: A | Discharge status: Home / Self Care | Payer: Medicare Other | Source: Ambulatory Visit | Attending: Internal Medicine | Admitting: Internal Medicine

## 2015-05-26 ENCOUNTER — Ambulatory Visit
Admission: RE | Admit: 2015-05-26 | Discharge: 2015-05-26 | Disposition: A | Discharge status: Home / Self Care | Payer: Medicare Other | Source: Ambulatory Visit | Attending: Internal Medicine | Admitting: Internal Medicine

## 2015-05-26 VITALS — BP 154/69 | HR 81 | Temp 97.7°F | Resp 18 | Ht 72.0 in | Wt 202.2 lb

## 2015-05-26 DIAGNOSIS — E291 Testicular hypofunction: Secondary | ICD-10-CM

## 2015-05-26 DIAGNOSIS — E041 Nontoxic single thyroid nodule: Secondary | ICD-10-CM

## 2015-05-26 DIAGNOSIS — E039 Hypothyroidism, unspecified: Secondary | ICD-10-CM | POA: Diagnosis not present

## 2015-05-26 DIAGNOSIS — C61 Malignant neoplasm of prostate: Secondary | ICD-10-CM

## 2015-05-26 NOTE — Progress Notes (Signed)
Hematology and Oncology Follow Up Visit  Edward Bailey [id number] 07/08/1936 79 y.o. 05/26/2015 3:53 PM Edward Bailey, MDNo ref. provider found   Principle Diagnosis: 79 year old gentleman with prostate cancer diagnosed in 1999 with a Gleason score 3+4 equals 7 and a PSA of around 4. He is currently experiencing a biochemical relapse without any measurable disease.   Prior Therapy:  He is status post prostatectomy and found to have stage T3b disease. He received salvage radiation therapy for a rise in his PSA.  He developed biochemical relapse and was treated with androgen depravation intermittently and most recently continuously with excellent response between 2010 to about 2015.  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   Current therapy: Ketoconazole at 200 mg twice a day and prednisone 5 mg daily.  Interim History:  Edward Bailey presents today for a followup visit. Since the last visit, he continues to have issues with fatigue and tiredness. He was evaluated by his primary care physician and a TSH was checked and there was very low indicating hyperthyroidism. Thyroid scan and ultrasound showed increased uptake in the thyroid as well as a cold nodule. The nodule was biopsied today and the biopsy are still pending. In the interim, he was started on methimazole for the last 3 days. Has not reported any major changes in his performance status or activity level. He still quite fatigued and decrease in his exercise tolerance. Has not reported any palpitation or dyspnea on exertion.  He is still on ketoconazole and does not report any other side effects associated with it. He is not reporting any lower extremity edema or easy bruisability. His abdominal pain have resolved since the last visit. Has not reported any nausea or vomiting. Has not reported any change in his bowel habits.  He did not report any bony pain or  worsening back pain.He does not report any dysuria or hematuria. He does report nocturia and frequency. He does not report any headaches, blurred vision or syncope. He does not report any chest pain, shortness of breath, leg edema or palpitation. He does not report any wheezing, cough or hemoptysis. Is not report any fevers, chills or weight loss. He does not report any skeletal complaints no arthralgias or myalgias. He is not report any lymphadenopathy or petechiae. The rest of his review of systems unremarkable.      Medications: I have reviewed the patient's current medications.  Current Outpatient Prescriptions  Medication Sig Dispense Refill  . aspirin (ASPIRIN EC) 81 MG EC tablet Take 81 mg by mouth daily. Swallow whole.    Marland Kitchen azelastine (ASTELIN) 0.1 % nasal spray Place 1-2 sprays into both nostrils daily as needed. allergies  5  . cetirizine (ZYRTEC) 10 MG tablet Take 10 mg by mouth daily as needed for allergies. As needed    . fluticasone (FLONASE) 50 MCG/ACT nasal spray Place 1 spray into both nostrils daily as needed. Allergies  5  . hydrocortisone 2.5 % lotion Apply 1 application topically at bedtime. Apply to scalp    . ketoconazole (NIZORAL) 200 MG tablet TAKE 1 TABLET TWICE A DAY 60 tablet 3  . methimazole (TAPAZOLE) 10 MG tablet Take 20 mg by mouth daily.    . metoprolol succinate (TOPROL-XL) 50 MG 24 hr tablet Take 50 mg by mouth daily.  5  . montelukast (SINGULAIR) 10 MG tablet Take 10 mg by mouth every evening.  5  . Multiple Vitamins-Minerals (MULTIVITAMIN PO) Take 1  capsule by mouth daily.    Marland Kitchen triamcinolone (KENALOG) 0.025 % ointment Apply topically as directed.  0   No current facility-administered medications for this visit.     Allergies:  Allergies  Allergen Reactions  . Amoxicillin Rash    Has patient had a PCN reaction causing immediate rash, facial/tongue/throat swelling, SOB or lightheadedness with hypotension: No Has patient had a PCN reaction causing severe  rash involving mucus membranes or skin necrosis: No Has patient had a PCN reaction that required hospitalization No Has patient had a PCN reaction occurring within the last 10 years: Yes If all of the above answers are "NO", then may proceed with Cephalosporin use.     Past Medical History, Surgical history, Social history, and Family History were reviewed and updated.   Physical Exam: Blood pressure 154/69, pulse 81, temperature 97.7 F (36.5 C), temperature source Oral, resp. rate 18, height 6' (1.829 m), weight 202 lb 3.2 oz (91.717 kg), SpO2 98 %. ECOG: 0 General appearance: alert and cooperative. Did not appear in any distress. Head: Normocephalic, without obvious abnormality oral mucosa showed no ulcers or lesions. Neck: no adenopathy Lymph nodes: Cervical, supraclavicular, and axillary nodes normal. Heart:regular rate and rhythm, S1, S2 normal, no murmur, click, rub or gallop Lung:chest clear, no wheezing, rales, normal symmetric air entry Abdomin: soft, non-tender, without masses or organomegaly. No rebound or guarding.  EXT:no erythema, induration, or nodules.  Skin: Small bruising noted on his upper extremities.   Lab Results: Lab Results  Component Value Date   WBC 7.1 05/20/2015   HGB 13.8 05/20/2015   HCT 41.1 05/20/2015   MCV 87.1 05/20/2015   PLT 245 05/20/2015     Chemistry      Component Value Date/Time   NA 143 05/20/2015 0941   NA 139 06/22/2008 1042   K 4.2 05/20/2015 0941   K 5.1 06/22/2008 1042   CL 104 06/22/2008 1042   CO2 25 05/20/2015 0941   CO2 28 06/22/2008 1042   BUN 25.1 05/20/2015 0941   BUN 22 06/22/2008 1042   CREATININE 0.9 05/20/2015 0941   CREATININE 1.24 06/22/2008 1042      Component Value Date/Time   CALCIUM 9.0 05/20/2015 0941   CALCIUM 9.5 06/22/2008 1042   ALKPHOS 71 05/20/2015 0941   ALKPHOS 66 06/22/2008 1042   AST 19 05/20/2015 0941   AST 21 06/22/2008 1042   ALT 31 05/20/2015 0941   ALT 14 06/22/2008 1042    BILITOT 0.39 05/20/2015 0941   BILITOT 0.6 06/22/2008 1042        Results for Edward Bailey (MRN [id number]) as of 05/26/2015 15:21  Ref. Range 01/17/2015 09:26 03/21/2015 09:21 05/20/2015 09:41  PSA Latest Ref Range: <=4.00 ng/mL 3.80 3.76 2.46       Impression and Plan:  79 year-old gentleman with the following issues:  1. Prostate cancer diagnosed in 1999 with a Gleason score 3+4 equals 7 and a PSA of around 4. He is status post prostatectomy and found to have stage T3b disease. He developed a biochemical relapse and treated with intermittent androgen deprivation and currently has a rise in his PSA despite castrate levels testosterone. His CT scan on 04/12/2014 showed no evidence of any metastatic disease.   He is on ketoconazole and prednisone and tolerated it well. His PSA continues to decline and most recently on 05/20/2015 it was 2.46. I recommended continue the same dose and schedule unless there is contraindication from an endocrinology standpoint.  2. Androgen  depravation: He was advised to continue this under the care of [facility].  3. Abdominal discomfort: CT scan obtained on on 03/29/2015 was reviewed and discussed with the patient and showed no abnormalities. His symptoms have resolved at this time.  4. Hyperthyroidism: He is currently on methimazole and have not had any major changes in his symptoms at this time. We'll continue to monitor his liver function tests given the fact that he is on medications that can cause liver dysfunction.  5. Thyroid nodule: He status post thyroid biopsy on the results of pending. If indeed you with thyroid malignancy and will require endocrinology and Gen. surgery referral.  6. Followup: In 6  weeks to repeat laboratory testing.      Zola Button, MD 11/3/20163:53 PM

## 2015-05-26 NOTE — Telephone Encounter (Signed)
Gave and printed appts ched and avs for pt for DEC  °

## 2015-06-07 ENCOUNTER — Encounter (HOSPITAL_COMMUNITY): Payer: Self-pay | Admitting: *Deleted

## 2015-06-08 ENCOUNTER — Encounter (HOSPITAL_COMMUNITY): Payer: Self-pay

## 2015-06-12 NOTE — Anesthesia Preprocedure Evaluation (Addendum)
Anesthesia Evaluation  Patient identified by MRN, date of birth, ID band Patient awake    Reviewed: Allergy & Precautions, H&P , NPO status , Patient's Chart, lab work & pertinent test results, reviewed documented beta blocker date and time   Airway Mallampati: III  TM Distance: >3 FB Neck ROM: Full    Dental no notable dental hx. (+) Teeth Intact, Dental Advisory Given   Pulmonary neg pulmonary ROS,    Pulmonary exam normal breath sounds clear to auscultation       Cardiovascular hypertension, Pt. on medications and Pt. on home beta blockers  Rhythm:Regular Rate:Normal     Neuro/Psych negative neurological ROS  negative psych ROS   GI/Hepatic Neg liver ROS, GERD  ,  Endo/Other  Hyperthyroidism   Renal/GU negative Renal ROS  negative genitourinary   Musculoskeletal   Abdominal   Peds  Hematology negative hematology ROS (+)   Anesthesia Other Findings   Reproductive/Obstetrics negative OB ROS                            Anesthesia Physical Anesthesia Plan  ASA: II  Anesthesia Plan: MAC   Post-op Pain Management:    Induction: Intravenous  Airway Management Planned: Simple Face Mask  Additional Equipment:   Intra-op Plan:   Post-operative Plan:   Informed Consent: I have reviewed the patients History and Physical, chart, labs and discussed the procedure including the risks, benefits and alternatives for the proposed anesthesia with the patient or authorized representative who has indicated his/her understanding and acceptance.   Dental advisory given  Plan Discussed with: CRNA  Anesthesia Plan Comments:         Anesthesia Quick Evaluation

## 2015-06-13 ENCOUNTER — Encounter (HOSPITAL_COMMUNITY)
Admission: RE | Disposition: A | Discharge status: Home / Self Care | Payer: Self-pay | Source: Ambulatory Visit | Attending: Gastroenterology

## 2015-06-13 ENCOUNTER — Ambulatory Visit (HOSPITAL_COMMUNITY)
Admission: RE | Admit: 2015-06-13 | Discharge: 2015-06-13 | Disposition: A | Discharge status: Home / Self Care | Payer: Medicare Other | Source: Ambulatory Visit | Attending: Gastroenterology | Admitting: Gastroenterology

## 2015-06-13 ENCOUNTER — Ambulatory Visit (HOSPITAL_COMMUNITY): Payer: Medicare Other | Admitting: Anesthesiology

## 2015-06-13 ENCOUNTER — Encounter (HOSPITAL_COMMUNITY): Payer: Self-pay

## 2015-06-13 DIAGNOSIS — Z86010 Personal history of colonic polyps: Secondary | ICD-10-CM | POA: Diagnosis not present

## 2015-06-13 DIAGNOSIS — I10 Essential (primary) hypertension: Secondary | ICD-10-CM | POA: Insufficient documentation

## 2015-06-13 DIAGNOSIS — Z1211 Encounter for screening for malignant neoplasm of colon: Secondary | ICD-10-CM | POA: Insufficient documentation

## 2015-06-13 DIAGNOSIS — E7800 Pure hypercholesterolemia, unspecified: Secondary | ICD-10-CM | POA: Diagnosis not present

## 2015-06-13 DIAGNOSIS — E0590 Thyrotoxicosis, unspecified without thyrotoxic crisis or storm: Secondary | ICD-10-CM | POA: Insufficient documentation

## 2015-06-13 DIAGNOSIS — D122 Benign neoplasm of ascending colon: Secondary | ICD-10-CM | POA: Insufficient documentation

## 2015-06-13 DIAGNOSIS — K219 Gastro-esophageal reflux disease without esophagitis: Secondary | ICD-10-CM | POA: Insufficient documentation

## 2015-06-13 DIAGNOSIS — Z8546 Personal history of malignant neoplasm of prostate: Secondary | ICD-10-CM | POA: Insufficient documentation

## 2015-06-13 HISTORY — DX: Thyrotoxicosis, unspecified without thyrotoxic crisis or storm: E05.90

## 2015-06-13 HISTORY — PX: COLONOSCOPY WITH PROPOFOL: SHX5780

## 2015-06-13 SURGERY — COLONOSCOPY WITH PROPOFOL
Anesthesia: Monitor Anesthesia Care

## 2015-06-13 MED ORDER — PROPOFOL 10 MG/ML IV BOLUS
INTRAVENOUS | Status: AC
Start: 2015-06-13 — End: 2015-06-13
  Filled 2015-06-13: qty 20

## 2015-06-13 MED ORDER — PROPOFOL 10 MG/ML IV BOLUS
INTRAVENOUS | Status: AC
  Filled 2015-06-13: qty 20

## 2015-06-13 MED ORDER — LACTATED RINGERS IV SOLN
INTRAVENOUS | Status: DC
  Administered 2015-06-13 (×2): via INTRAVENOUS

## 2015-06-13 MED ORDER — SODIUM CHLORIDE 0.9 % IV SOLN: INTRAVENOUS | Status: DC

## 2015-06-13 MED ORDER — PROPOFOL 10 MG/ML IV BOLUS
INTRAVENOUS | Status: DC | PRN
  Administered 2015-06-13 (×3): 30 mg via INTRAVENOUS
  Administered 2015-06-13: 100 mg via INTRAVENOUS
  Administered 2015-06-13: 40 mg via INTRAVENOUS
  Administered 2015-06-13: 30 mg via INTRAVENOUS

## 2015-06-13 SURGICAL SUPPLY — 22 items

## 2015-06-13 NOTE — Discharge Instructions (Signed)

## 2015-06-13 NOTE — Op Note (Signed)
Procedure: Surveillance colonoscopy. Adenomatous colon polyps removed colonoscopically in the past  Endoscopist: Earle Gell  Premedication: Propofol administered by anesthesia  Procedure: The patient was placed in the left lateral decubitus position. Anal inspection and digital rectal exam were normal. The Pentax pediatric colonoscope was introduced into the rectum and advanced to the cecum. A normal-appearing appendiceal orifice was identified. A normal-appearing ileocecal valve was intubated and the terminal ileum inspected. Colonic preparation for the exam today was good. Withdrawal time was 21 minutes.  Rectum. Normal. Retroflexed view of the distal rectum was normal  Sigmoid colon and descending colon. Normal. Maintaining insufflation in the left colon was difficult.  Splenic flexure. Normal  Transverse colon. Normal  Hepatic flexure. Normal  Ascending colon. A 3 mm sessile polyp was removed with the cold biopsy forceps and a 5 mm sessile polyp was removed with the cold snare  Cecum and ileocecal valve. Normal  Terminal ileum. Normal  Assessment: Two small polyps were removed from the ascending colon. Otherwise normal colonoscopy

## 2015-06-13 NOTE — Transfer of Care (Signed)
Immediate Anesthesia Transfer of Care Note  Patient: Edward Bailey  Procedure(s) Performed: Procedure(s): COLONOSCOPY WITH PROPOFOL (N/A)  Patient Location: PACU  Anesthesia Type:MAC  Level of Consciousness: awake, alert  and oriented  Airway & Oxygen Therapy: Patient Spontanous Breathing and Patient connected to face mask oxygen  Post-op Assessment: Report given to RN and Post -op Vital signs reviewed and stable  Post vital signs: Reviewed and stable  Last Vitals:  Filed Vitals:   06/13/15 0832  BP: 141/70  Pulse: 75  Temp: 36.7 C  Resp: 13    Complications: No apparent anesthesia complications

## 2015-06-13 NOTE — H&P (Signed)
  Procedure: Surveillance colonoscopy. History of adenomatous colon polyps removed colonoscopically in the past  History: The patient is a 79 year old male born 1935/11/03. He is scheduled to undergo a surveillance colonoscopy today.  Past medical history: Radical prostatectomy to treat prostate cancer performed in June 1999. Adenomatous colon polyps removed colonoscopically in the past. Allergic rhinitis. Chronic tinnitus. Hypercholesterolemia. Acne rosacea. Right wrist fracture.  Medication allergies: Augmentin and amoxicillin  Exam: The patient is alert and lying comfortably on the endoscopy stretcher. Abdomen is soft and nontender to palpation. Lungs are clear to auscultation. Cardiac exam reveals a regular rhythm.  Plan: Proceed with surveillance colonoscopy

## 2015-06-13 NOTE — Anesthesia Postprocedure Evaluation (Signed)
Anesthesia Post Note  Patient: Edward Bailey  Procedure(s) Performed: Procedure(s) (LRB): COLONOSCOPY WITH PROPOFOL (N/A)  Patient location during evaluation: PACU Anesthesia Type: MAC Level of consciousness: awake and alert Pain management: pain level controlled Vital Signs Assessment: post-procedure vital signs reviewed and stable Respiratory status: spontaneous breathing Cardiovascular status: blood pressure returned to baseline Postop Assessment: No signs of nausea or vomiting Anesthetic complications: no    Last Vitals:  Filed Vitals:   06/13/15 1020 06/13/15 1030  BP: 148/81 153/75  Pulse: 121 72  Temp:    Resp: 19 20    Last Pain: There were no vitals filed for this visit.               Devonne Kitchen,W. EDMOND

## 2015-06-14 ENCOUNTER — Encounter (HOSPITAL_COMMUNITY): Payer: Self-pay | Admitting: Gastroenterology

## 2015-07-04 ENCOUNTER — Other Ambulatory Visit (HOSPITAL_BASED_OUTPATIENT_CLINIC_OR_DEPARTMENT_OTHER): Payer: Medicare Other

## 2015-07-04 DIAGNOSIS — E039 Hypothyroidism, unspecified: Secondary | ICD-10-CM

## 2015-07-04 DIAGNOSIS — C61 Malignant neoplasm of prostate: Secondary | ICD-10-CM | POA: Diagnosis not present

## 2015-07-04 LAB — CBC WITH DIFFERENTIAL/PLATELET
BASO%: 0.7 % (ref 0.0–2.0)
Basophils Absolute: 0 10*3/uL (ref 0.0–0.1)
EOS ABS: 0.6 10*3/uL — AB (ref 0.0–0.5)
EOS%: 9.7 % — ABNORMAL HIGH (ref 0.0–7.0)
HEMATOCRIT: 40.6 % (ref 38.4–49.9)
HEMOGLOBIN: 13.7 g/dL (ref 13.0–17.1)
LYMPH#: 1.2 10*3/uL (ref 0.9–3.3)
LYMPH%: 20.8 % (ref 14.0–49.0)
MCH: 29.8 pg (ref 27.2–33.4)
MCHC: 33.7 g/dL (ref 32.0–36.0)
MCV: 88.5 fL (ref 79.3–98.0)
MONO#: 0.5 10*3/uL (ref 0.1–0.9)
MONO%: 9.2 % (ref 0.0–14.0)
NEUT%: 59.6 % (ref 39.0–75.0)
NEUTROS ABS: 3.5 10*3/uL (ref 1.5–6.5)
NRBC: 0 % (ref 0–0)
PLATELETS: 282 10*3/uL (ref 140–400)
RBC: 4.59 10*6/uL (ref 4.20–5.82)
RDW: 13.8 % (ref 11.0–14.6)
WBC: 5.9 10*3/uL (ref 4.0–10.3)

## 2015-07-04 LAB — COMPREHENSIVE METABOLIC PANEL
ALT: 15 U/L (ref 0–55)
ANION GAP: 8 meq/L (ref 3–11)
AST: 17 U/L (ref 5–34)
Albumin: 3.5 g/dL (ref 3.5–5.0)
Alkaline Phosphatase: 105 U/L (ref 40–150)
BILIRUBIN TOTAL: 0.47 mg/dL (ref 0.20–1.20)
BUN: 16.3 mg/dL (ref 7.0–26.0)
CALCIUM: 8.9 mg/dL (ref 8.4–10.4)
CO2: 24 mEq/L (ref 22–29)
CREATININE: 1.1 mg/dL (ref 0.7–1.3)
Chloride: 107 mEq/L (ref 98–109)
EGFR: 64 mL/min/{1.73_m2} — ABNORMAL LOW (ref >90)
Glucose: 169 mg/dl — ABNORMAL HIGH (ref 70–140)
Potassium: 3.9 mEq/L (ref 3.5–5.1)
Sodium: 140 mEq/L (ref 136–145)
TOTAL PROTEIN: 6.3 g/dL — AB (ref 6.4–8.3)

## 2015-07-05 LAB — PSA: PSA: 3 ng/mL (ref <=4.00)

## 2015-07-06 ENCOUNTER — Other Ambulatory Visit (HOSPITAL_COMMUNITY): Payer: Self-pay | Admitting: Internal Medicine

## 2015-07-06 DIAGNOSIS — E0590 Thyrotoxicosis, unspecified without thyrotoxic crisis or storm: Secondary | ICD-10-CM

## 2015-07-08 ENCOUNTER — Telehealth: Payer: Self-pay | Admitting: Oncology

## 2015-07-08 ENCOUNTER — Ambulatory Visit (HOSPITAL_BASED_OUTPATIENT_CLINIC_OR_DEPARTMENT_OTHER): Payer: Medicare Other | Admitting: Oncology

## 2015-07-08 VITALS — BP 148/74 | HR 77 | Resp 18 | Ht 72.0 in | Wt 210.7 lb

## 2015-07-08 DIAGNOSIS — E039 Hypothyroidism, unspecified: Secondary | ICD-10-CM | POA: Diagnosis not present

## 2015-07-08 DIAGNOSIS — C61 Malignant neoplasm of prostate: Secondary | ICD-10-CM | POA: Diagnosis not present

## 2015-07-08 DIAGNOSIS — E291 Testicular hypofunction: Secondary | ICD-10-CM

## 2015-07-08 NOTE — Telephone Encounter (Signed)
Gave patient avs report and appointments for January and February  °

## 2015-07-08 NOTE — Progress Notes (Signed)
Hematology and Oncology Follow Up Visit  Edward Bailey:[id number] 03-13-1936 79 y.o. 07/08/2015 10:40 AM [name], MDGriffin, [name], MD   Principle Diagnosis: 79 year old gentleman with prostate cancer diagnosed in 1999 with a Gleason score 3+4 equals 7 and a PSA of around 4. He is currently experiencing a biochemical relapse without any measurable disease.   Prior Therapy:  He is status post prostatectomy and found to have stage T3b disease. He received salvage radiation therapy for a rise in his PSA.  He developed biochemical relapse and was treated with androgen depravation intermittently and most recently continuously with excellent response between 2010 to about 2015.  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   Current therapy: Ketoconazole at 200 mg twice a day and prednisone 5 mg daily. Prednisone have been discontinued because of gastrointestinal intolerance.  Interim History:  Edward Bailey presents today for a followup visit. Since the last visit, he was diagnosed with hypothyroidism and currently on medication for it. He is planning to have radioablation of his thyroid in the near future and likely will be on thyroid replacement after that.   He continues to have issues with fatigue and tiredness. He says improved since the last visit and still able to perform activities of daily living. He does report excessive fatigue if he is climbing steps or exerts himself for a long period of time.   He is still on ketoconazole and does not report any other side effects associated with it. He is not reporting any lower extremity edema or easy bruisability. His abdominal pain have resolved since the last visit. Has not reported any nausea or vomiting. Has not reported any change in his bowel habits. Has not reported any back pain or shoulder pain or any arthralgias or myalgias.  He does not report any dysuria  or hematuria. He does report nocturia and frequency. He does not report any headaches, blurred vision or syncope. He does not report any chest pain, shortness of breath, leg edema or palpitation. He does not report any wheezing, cough or hemoptysis. Is not report any fevers, chills or weight loss. He does not report any skeletal complaints no arthralgias or myalgias. He is not report any lymphadenopathy or petechiae. The rest of his review of systems unremarkable.      Medications: I have reviewed the patient's current medications.  Current Outpatient Prescriptions  Medication Sig Dispense Refill  . aspirin (ASPIRIN EC) 81 MG EC tablet Take 81 mg by mouth daily. Swallow whole.    Marland Kitchen azelastine (ASTELIN) 0.1 % nasal spray Place 1-2 sprays into both nostrils daily as needed. allergies  5  . cetirizine (ZYRTEC) 10 MG tablet Take 10 mg by mouth daily as needed for allergies. As needed    . fluticasone (FLONASE) 50 MCG/ACT nasal spray Place 1 spray into both nostrils daily as needed. Allergies  5  . hydrocortisone 2.5 % lotion Apply 1 application topically at bedtime. Apply to scalp    . ketoconazole (NIZORAL) 200 MG tablet TAKE 1 TABLET TWICE A DAY 60 tablet 3  . methimazole (TAPAZOLE) 10 MG tablet Take 20 mg by mouth daily.    . Multiple Vitamins-Minerals (MULTIVITAMIN PO) Take 1 capsule by mouth daily.    Marland Kitchen triamcinolone (KENALOG) 0.025 % ointment Apply topically as directed.  0   No current facility-administered medications for this visit.     Allergies:  Allergies  Allergen Reactions  . Amoxicillin Rash  Has patient had a PCN reaction causing immediate rash, facial/tongue/throat swelling, SOB or lightheadedness with hypotension: No Has patient had a PCN reaction causing severe rash involving mucus membranes or skin necrosis: No Has patient had a PCN reaction that required hospitalization No Has patient had a PCN reaction occurring within the last 10 years: Yes If all of the above  answers are "NO", then may proceed with Cephalosporin use.     Past Medical History, Surgical history, Social history, and Family History were reviewed and updated.   Physical Exam: Blood pressure 148/74, pulse 77, resp. rate 18, height 6' (1.829 m), weight 210 lb 11.2 oz (95.573 kg), SpO2 100 %. ECOG: 0 General appearance: alert and cooperative. Well-appearing gentleman without distress. Head: Normocephalic, without obvious abnormality oral mucosa showed no oral thrush. Neck: no adenopathy Lymph nodes: Cervical, supraclavicular, and axillary nodes normal. Heart:regular rate and rhythm, S1, S2 normal, no murmur, click, rub or gallop Lung:chest clear, no wheezing, rales, normal symmetric air entry Abdomin: soft, non-tender, without masses or organomegaly. No rebound or guarding.  EXT:no erythema, induration, or nodules.  Skin: Small bruising noted on his upper extremities.   Lab Results: Lab Results  Component Value Date   WBC 5.9 07/04/2015   HGB 13.7 07/04/2015   HCT 40.6 07/04/2015   MCV 88.5 07/04/2015   PLT 282 07/04/2015     Chemistry      Component Value Date/Time   NA 140 07/04/2015 0911   NA 139 06/22/2008 1042   K 3.9 07/04/2015 0911   K 5.1 06/22/2008 1042   CL 104 06/22/2008 1042   CO2 24 07/04/2015 0911   CO2 28 06/22/2008 1042   BUN 16.3 07/04/2015 0911   BUN 22 06/22/2008 1042   CREATININE 1.1 07/04/2015 0911   CREATININE 1.24 06/22/2008 1042      Component Value Date/Time   CALCIUM 8.9 07/04/2015 0911   CALCIUM 9.5 06/22/2008 1042   ALKPHOS 105 07/04/2015 0911   ALKPHOS 66 06/22/2008 1042   AST 17 07/04/2015 0911   AST 21 06/22/2008 1042   ALT 15 07/04/2015 0911   ALT 14 06/22/2008 1042   BILITOT 0.47 07/04/2015 0911   BILITOT 0.6 06/22/2008 1042         Results for Edward Bailey, Edward Bailey (MRN [id number]) as of 07/08/2015 10:27  Ref. Range 03/21/2015 09:21 05/20/2015 09:41 07/04/2015 09:11  PSA Latest Ref Range: <=4.00 ng/mL 3.76 2.46 3.00      Impression and Plan:  79 year-old gentleman with the following issues:  1. Prostate cancer diagnosed in 1999 with a Gleason score 3+4 equals 7 and a PSA of around 4. He is status post prostatectomy and found to have stage T3b disease. He developed a biochemical relapse and treated with intermittent androgen deprivation and currently has a rise in his PSA despite castrate levels testosterone. His CT scan on 04/12/2014 showed no evidence of any metastatic disease.   He is on ketoconazole and tolerated it well. His PSA has declined initially from 17 to a nadir of 2.46. His most recent PSA is slightly elevated to 3.0. It is unclear whether ketoconazole is no longer effective or this might be just fluctuating PSA which is to be expected. I recommended continuing the same dose and schedule and repeat PSA in 6 weeks. His PSA continues to rise, different salvage agent will be used. His PSA remains stable or declines and will continue on the same dose and schedule.  2. Androgen depravation: He was advised to continue  this under the care of [facility].  3. Abdominal discomfort: CT scan obtained on on 03/29/2015 showed no metastatic disease or any abnormalities. His abdominal pain has improved.  4. Hyperthyroidism: He is currently on methimazole the plan to have radial ablation of his thyroid in the near future.  5. Thyroid nodule: He status post thyroid biopsy which showed no malignancy at this time.  6. Followup: In 6  weeks to repeat laboratory testing.      Edward Kanaan, MD 12/16/201610:40 AM

## 2015-07-27 ENCOUNTER — Encounter (HOSPITAL_COMMUNITY)
Admission: RE | Admit: 2015-07-27 | Discharge: 2015-07-27 | Disposition: A | Discharge status: Home / Self Care | Payer: Medicare Other | Source: Ambulatory Visit | Attending: Internal Medicine | Admitting: Internal Medicine

## 2015-07-27 DIAGNOSIS — E0590 Thyrotoxicosis, unspecified without thyrotoxic crisis or storm: Secondary | ICD-10-CM | POA: Insufficient documentation

## 2015-07-27 MED ORDER — SODIUM IODIDE I 131 CAPSULE
30.5000 | Freq: Once | INTRAVENOUS | Status: AC | PRN
  Administered 2015-07-27: 30.5 via ORAL

## 2015-08-22 ENCOUNTER — Other Ambulatory Visit (HOSPITAL_BASED_OUTPATIENT_CLINIC_OR_DEPARTMENT_OTHER): Payer: Medicare Other

## 2015-08-22 DIAGNOSIS — C61 Malignant neoplasm of prostate: Secondary | ICD-10-CM | POA: Diagnosis not present

## 2015-08-22 LAB — COMPREHENSIVE METABOLIC PANEL
ALK PHOS: 122 U/L (ref 40–150)
ALT: 15 U/L (ref 0–55)
ANION GAP: 8 meq/L (ref 3–11)
AST: 17 U/L (ref 5–34)
Albumin: 3.6 g/dL (ref 3.5–5.0)
BUN: 23 mg/dL (ref 7.0–26.0)
CO2: 25 meq/L (ref 22–29)
Calcium: 9.2 mg/dL (ref 8.4–10.4)
Chloride: 107 mEq/L (ref 98–109)
Creatinine: 1.1 mg/dL (ref 0.7–1.3)
EGFR: 64 mL/min/{1.73_m2} — AB (ref >90)
Glucose: 81 mg/dl (ref 70–140)
POTASSIUM: 4.4 meq/L (ref 3.5–5.1)
SODIUM: 140 meq/L (ref 136–145)
TOTAL PROTEIN: 6.8 g/dL (ref 6.4–8.3)
Total Bilirubin: 0.35 mg/dL (ref 0.20–1.20)

## 2015-08-22 LAB — CBC WITH DIFFERENTIAL/PLATELET
BASO%: 0.5 % (ref 0.0–2.0)
BASOS ABS: 0 10*3/uL (ref 0.0–0.1)
EOS ABS: 0.4 10*3/uL (ref 0.0–0.5)
EOS%: 6.5 % (ref 0.0–7.0)
HCT: 41 % (ref 38.4–49.9)
HGB: 13.8 g/dL (ref 13.0–17.1)
LYMPH%: 10.1 % — AB (ref 14.0–49.0)
MCH: 29.9 pg (ref 27.2–33.4)
MCHC: 33.7 g/dL (ref 32.0–36.0)
MCV: 88.9 fL (ref 79.3–98.0)
MONO#: 0.8 10*3/uL (ref 0.1–0.9)
MONO%: 13.5 % (ref 0.0–14.0)
NEUT%: 69.4 % (ref 39.0–75.0)
NEUTROS ABS: 3.9 10*3/uL (ref 1.5–6.5)
PLATELETS: 262 10*3/uL (ref 140–400)
RBC: 4.61 10*6/uL (ref 4.20–5.82)
RDW: 12.3 % (ref 11.0–14.6)
WBC: 5.6 10*3/uL (ref 4.0–10.3)
lymph#: 0.6 10*3/uL — ABNORMAL LOW (ref 0.9–3.3)

## 2015-08-23 LAB — PSA: PROSTATE SPECIFIC AG, SERUM: 4.7 ng/mL — AB (ref 0.0–4.0)

## 2015-08-23 LAB — PSA (PARALLEL TESTING): PSA: 4.68 ng/mL — AB (ref <=4.00)

## 2015-08-24 ENCOUNTER — Ambulatory Visit (HOSPITAL_BASED_OUTPATIENT_CLINIC_OR_DEPARTMENT_OTHER): Payer: Medicare Other | Admitting: Oncology

## 2015-08-24 ENCOUNTER — Telehealth: Payer: Self-pay | Admitting: Oncology

## 2015-08-24 VITALS — BP 147/88 | HR 79 | Resp 17 | Ht 72.0 in | Wt 212.4 lb

## 2015-08-24 DIAGNOSIS — E291 Testicular hypofunction: Secondary | ICD-10-CM | POA: Diagnosis not present

## 2015-08-24 DIAGNOSIS — E041 Nontoxic single thyroid nodule: Secondary | ICD-10-CM | POA: Diagnosis not present

## 2015-08-24 DIAGNOSIS — E0590 Thyrotoxicosis, unspecified without thyrotoxic crisis or storm: Secondary | ICD-10-CM | POA: Diagnosis not present

## 2015-08-24 DIAGNOSIS — C61 Malignant neoplasm of prostate: Secondary | ICD-10-CM | POA: Diagnosis not present

## 2015-08-24 DIAGNOSIS — E039 Hypothyroidism, unspecified: Secondary | ICD-10-CM

## 2015-08-24 NOTE — Telephone Encounter (Signed)
Moved 3/17 lab to 3/15 - f/u remains 3/17. Patient given new schedule.

## 2015-08-24 NOTE — Progress Notes (Signed)
Hematology and Oncology Follow Up Visit  Edward Bailey [id number] 01/30/1936 80 y.o. 08/24/2015 12:11 PM Edward Bailey, MDGriffin, [name], MD   Principle Diagnosis: 80 year old gentleman with prostate cancer diagnosed in 1999 with a Gleason score 3+4 equals 7 and a PSA of around 4. He is currently experiencing a biochemical relapse without any measurable disease.   Prior Therapy:  He is status post prostatectomy and found to have stage T3b disease. He received salvage radiation therapy for a rise in his PSA.  He developed biochemical relapse and was treated with androgen depravation intermittently and most recently continuously with excellent response between 2010 to about 2015.  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   Current therapy: Ketoconazole at 200 mg twice a day and prednisone 5 mg daily. Prednisone have been discontinued because of gastrointestinal intolerance.  Interim History:  Edward Bailey presents today for a followup visit. Since the last visit, he reports no major changes in his health. He continues to have some mild fatigue and tiredness associated with his thyroid disease. He is still on ketoconazole and does not report any other side effects associated with it. He is not reporting any lower extremity edema or easy bruisability. His abdominal pain have resolved since the last visit. Has not reported any nausea or vomiting. Has not reported any change in his bowel habits.   Despite his fatigue and tiredness he continues to be active at this time without any major changes in his performance status.  He does not report any dysuria or hematuria. He does report nocturia and frequency. He does not report any headaches, blurred vision or syncope. He does not report any chest pain, shortness of breath, leg edema or palpitation. He does not report any wheezing, cough or hemoptysis. Is not report any fevers,  chills or weight loss. He does not report any skeletal complaints no arthralgias or myalgias. He is not report any lymphadenopathy or petechiae. The rest of his review of systems unremarkable.      Medications: I have reviewed the patient's current medications.  Current Outpatient Prescriptions  Medication Sig Dispense Refill  . aspirin (ASPIRIN EC) 81 MG EC tablet Take 81 mg by mouth daily. Swallow whole.    Marland Kitchen azelastine (ASTELIN) 0.1 % nasal spray Place 1-2 sprays into both nostrils daily as needed. allergies  5  . cetirizine (ZYRTEC) 10 MG tablet Take 10 mg by mouth daily as needed for allergies. As needed    . fluticasone (FLONASE) 50 MCG/ACT nasal spray Place 1 spray into both nostrils daily as needed. Allergies  5  . hydrocortisone 2.5 % lotion Apply 1 application topically at bedtime. Apply to scalp    . ketoconazole (NIZORAL) 200 MG tablet TAKE 1 TABLET TWICE A DAY 60 tablet 3  . methimazole (TAPAZOLE) 10 MG tablet Take 20 mg by mouth daily.    . Multiple Vitamins-Minerals (MULTIVITAMIN PO) Take 1 capsule by mouth daily.    Marland Kitchen triamcinolone (KENALOG) 0.025 % ointment Apply topically as directed.  0   No current facility-administered medications for this visit.     Allergies:  Allergies  Allergen Reactions  . Amoxicillin Rash    Has patient had a PCN reaction causing immediate rash, facial/tongue/throat swelling, SOB or lightheadedness with hypotension: No Has patient had a PCN reaction causing severe rash involving mucus membranes or skin necrosis: No Has patient had a PCN reaction that required hospitalization No Has patient had a PCN reaction  occurring within the last 10 years: Yes If all of the above answers are "NO", then may proceed with Cephalosporin use.     Past Medical History, Surgical history, Social history, and Family History were reviewed and updated.   Physical Exam: Blood pressure 147/88, pulse 79, resp. rate 17, height 6' (1.829 m), weight 212 lb 6.4 oz  (96.344 kg), SpO2 99 %. ECOG: 0 General appearance: alert and cooperative. Appeared without distress. Head: Normocephalic, without obvious abnormality no oral thrush. Neck: no adenopathy Lymph nodes: Cervical, supraclavicular, and axillary nodes normal. Heart:regular rate and rhythm, S1, S2 normal, no murmur, click, rub or gallop Lung:chest clear, no wheezing, rales, normal symmetric air entry Abdomin: soft, non-tender, without masses or organomegaly. No rebound or guarding.  EXT:no erythema, induration, or nodules.  Skin: Small bruising noted on his upper extremities.   Lab Results: Lab Results  Component Value Date   WBC 5.6 08/22/2015   HGB 13.8 08/22/2015   HCT 41.0 08/22/2015   MCV 88.9 08/22/2015   PLT 262 08/22/2015     Chemistry      Component Value Date/Time   NA 140 08/22/2015 1112   NA 139 06/22/2008 1042   K 4.4 08/22/2015 1112   K 5.1 06/22/2008 1042   CL 104 06/22/2008 1042   CO2 25 08/22/2015 1112   CO2 28 06/22/2008 1042   BUN 23.0 08/22/2015 1112   BUN 22 06/22/2008 1042   CREATININE 1.1 08/22/2015 1112   CREATININE 1.24 06/22/2008 1042      Component Value Date/Time   CALCIUM 9.2 08/22/2015 1112   CALCIUM 9.5 06/22/2008 1042   ALKPHOS 122 08/22/2015 1112   ALKPHOS 66 06/22/2008 1042   AST 17 08/22/2015 1112   AST 21 06/22/2008 1042   ALT 15 08/22/2015 1112   ALT 14 06/22/2008 1042   BILITOT 0.35 08/22/2015 1112   BILITOT 0.6 06/22/2008 1042       Results for CREE, STRAWDER (MRN [id number]) as of 08/24/2015 11:56  Ref. Range 07/04/2015 09:11 08/22/2015 11:12  PSA Latest Ref Range: <=4.00 ng/mL 3.00 4.68 (H)        Impression and Plan:  80 year-old gentleman with the following issues:  1. Prostate cancer diagnosed in 1999 with a Gleason score 3+4 equals 7 and a PSA of around 4. He is status post prostatectomy and found to have stage T3b disease. He developed a biochemical relapse and treated with intermittent androgen deprivation and  currently has a rise in his PSA despite castrate levels testosterone. His CT scan on 04/12/2014 showed no evidence of any metastatic disease.   He is on ketoconazole and tolerated it well. His PSA has shown some slight increase in the last few months. The plan is to continue on the same dose and schedule and if his PSA continues to rise, the for salvage therapy will be needed. Plan is to repeat bone scan at that time.  2. Androgen depravation: He was advised to continue this under the care of [facility].  3. Abdominal discomfort: CT scan obtained on on 03/29/2015 showed no metastatic disease or any abnormalities. His abdominal pain has improved.  4. Hyperthyroidism: He is currently on methimazole and have underwent iodine ablation for his thyroid.  5. Thyroid nodule: He status post thyroid biopsy which showed no malignancy at this time.  6. Followup: In 6  weeks to repeat laboratory testing.      Zola Button, MD 2/1/201712:11 PM

## 2015-08-24 NOTE — Telephone Encounter (Signed)
Gave patient avs report and appointments for March  °

## 2015-08-26 DIAGNOSIS — E0590 Thyrotoxicosis, unspecified without thyrotoxic crisis or storm: Secondary | ICD-10-CM | POA: Diagnosis not present

## 2015-09-09 DIAGNOSIS — N5201 Erectile dysfunction due to arterial insufficiency: Secondary | ICD-10-CM | POA: Diagnosis not present

## 2015-09-09 DIAGNOSIS — E291 Testicular hypofunction: Secondary | ICD-10-CM | POA: Diagnosis not present

## 2015-09-09 DIAGNOSIS — C61 Malignant neoplasm of prostate: Secondary | ICD-10-CM | POA: Diagnosis not present

## 2015-09-14 ENCOUNTER — Other Ambulatory Visit: Payer: Self-pay | Admitting: Oncology

## 2015-09-15 ENCOUNTER — Telehealth: Payer: Self-pay | Admitting: Oncology

## 2015-09-15 NOTE — Telephone Encounter (Signed)
Pt called with questions regarding his billing statements for office visit 07/08/15  $245.00 and 08/24/15 $160.00. Wanting to know why his office visit on 07/08/15 was higher than office visit 08/24/15. Office visit 07/08/15 scheduled for 54mins (which was more detail)and office visit 08/24/15 scheduled for 88mins.  No answerleft contact information to call me back.

## 2015-10-05 ENCOUNTER — Other Ambulatory Visit (HOSPITAL_BASED_OUTPATIENT_CLINIC_OR_DEPARTMENT_OTHER): Payer: Medicare Other

## 2015-10-05 DIAGNOSIS — C61 Malignant neoplasm of prostate: Secondary | ICD-10-CM

## 2015-10-05 LAB — CBC WITH DIFFERENTIAL/PLATELET
BASO%: 1.1 % (ref 0.0–2.0)
BASOS ABS: 0.1 10*3/uL (ref 0.0–0.1)
EOS%: 8.1 % — AB (ref 0.0–7.0)
Eosinophils Absolute: 0.6 10*3/uL — ABNORMAL HIGH (ref 0.0–0.5)
HEMATOCRIT: 43.8 % (ref 38.4–49.9)
HEMOGLOBIN: 14.4 g/dL (ref 13.0–17.1)
LYMPH#: 0.9 10*3/uL (ref 0.9–3.3)
LYMPH%: 12.7 % — ABNORMAL LOW (ref 14.0–49.0)
MCH: 28.7 pg (ref 27.2–33.4)
MCHC: 32.9 g/dL (ref 32.0–36.0)
MCV: 87.3 fL (ref 79.3–98.0)
MONO#: 0.6 10*3/uL (ref 0.1–0.9)
MONO%: 8.4 % (ref 0.0–14.0)
NEUT#: 4.9 10*3/uL (ref 1.5–6.5)
NEUT%: 69.7 % (ref 39.0–75.0)
Platelets: 300 10*3/uL (ref 140–400)
RBC: 5.01 10*6/uL (ref 4.20–5.82)
RDW: 13 % (ref 11.0–14.6)
WBC: 7 10*3/uL (ref 4.0–10.3)

## 2015-10-05 LAB — COMPREHENSIVE METABOLIC PANEL
ALBUMIN: 3.6 g/dL (ref 3.5–5.0)
ALK PHOS: 105 U/L (ref 40–150)
ALT: 13 U/L (ref 0–55)
AST: 16 U/L (ref 5–34)
Anion Gap: 7 mEq/L (ref 3–11)
BILIRUBIN TOTAL: 0.42 mg/dL (ref 0.20–1.20)
BUN: 17.9 mg/dL (ref 7.0–26.0)
CALCIUM: 8.8 mg/dL (ref 8.4–10.4)
CO2: 26 mEq/L (ref 22–29)
CREATININE: 1.2 mg/dL (ref 0.7–1.3)
Chloride: 105 mEq/L (ref 98–109)
EGFR: 56 mL/min/{1.73_m2} — ABNORMAL LOW (ref >90)
Glucose: 66 mg/dl — ABNORMAL LOW (ref 70–140)
Potassium: 4.1 mEq/L (ref 3.5–5.1)
Sodium: 138 mEq/L (ref 136–145)
Total Protein: 6.8 g/dL (ref 6.4–8.3)

## 2015-10-07 ENCOUNTER — Ambulatory Visit: Payer: Medicare Other | Admitting: Oncology

## 2015-10-07 ENCOUNTER — Other Ambulatory Visit: Payer: Medicare Other

## 2015-10-10 ENCOUNTER — Telehealth: Payer: Self-pay | Admitting: Oncology

## 2015-10-10 NOTE — Telephone Encounter (Signed)
pt called to r/s missed appt....pt ok and aware of new d.t °

## 2015-10-11 ENCOUNTER — Other Ambulatory Visit: Payer: Self-pay | Admitting: Oncology

## 2015-10-11 DIAGNOSIS — E0590 Thyrotoxicosis, unspecified without thyrotoxic crisis or storm: Secondary | ICD-10-CM | POA: Diagnosis not present

## 2015-10-12 ENCOUNTER — Encounter: Payer: Self-pay | Admitting: *Deleted

## 2015-10-12 DIAGNOSIS — C61 Malignant neoplasm of prostate: Secondary | ICD-10-CM

## 2015-10-12 NOTE — Telephone Encounter (Signed)
Per do shadad, do not refill, patient needs an appt. pof to schedulers for 1st available with lab. PSA ordered.

## 2015-10-13 ENCOUNTER — Other Ambulatory Visit: Payer: Self-pay | Admitting: *Deleted

## 2015-10-13 DIAGNOSIS — C61 Malignant neoplasm of prostate: Secondary | ICD-10-CM | POA: Insufficient documentation

## 2015-10-13 MED ORDER — KETOCONAZOLE 200 MG PO TABS: 200.0000 mg | ORAL_TABLET | Freq: Two times a day (BID) | ORAL | Status: DC

## 2015-10-14 DIAGNOSIS — C61 Malignant neoplasm of prostate: Secondary | ICD-10-CM | POA: Diagnosis not present

## 2015-10-14 DIAGNOSIS — Z0000 Encounter for general adult medical examination without abnormal findings: Secondary | ICD-10-CM | POA: Diagnosis not present

## 2015-10-14 DIAGNOSIS — E0590 Thyrotoxicosis, unspecified without thyrotoxic crisis or storm: Secondary | ICD-10-CM | POA: Diagnosis not present

## 2015-10-14 DIAGNOSIS — Z1389 Encounter for screening for other disorder: Secondary | ICD-10-CM | POA: Diagnosis not present

## 2015-10-31 ENCOUNTER — Other Ambulatory Visit: Payer: Self-pay | Admitting: *Deleted

## 2015-10-31 DIAGNOSIS — C61 Malignant neoplasm of prostate: Secondary | ICD-10-CM

## 2015-11-01 ENCOUNTER — Telehealth: Payer: Self-pay | Admitting: Oncology

## 2015-11-01 ENCOUNTER — Ambulatory Visit (HOSPITAL_BASED_OUTPATIENT_CLINIC_OR_DEPARTMENT_OTHER): Payer: Medicare Other | Admitting: Oncology

## 2015-11-01 ENCOUNTER — Other Ambulatory Visit (HOSPITAL_BASED_OUTPATIENT_CLINIC_OR_DEPARTMENT_OTHER): Payer: Medicare Other

## 2015-11-01 VITALS — BP 134/72 | HR 76 | Temp 97.6°F | Resp 20 | Ht 72.0 in | Wt 220.2 lb

## 2015-11-01 DIAGNOSIS — E291 Testicular hypofunction: Secondary | ICD-10-CM | POA: Diagnosis not present

## 2015-11-01 DIAGNOSIS — E039 Hypothyroidism, unspecified: Secondary | ICD-10-CM | POA: Diagnosis not present

## 2015-11-01 DIAGNOSIS — C61 Malignant neoplasm of prostate: Secondary | ICD-10-CM

## 2015-11-01 NOTE — Telephone Encounter (Signed)
per pof to sch pt appt-adv pt [facility] call to [facility] scan-gave pt copy of avs °

## 2015-11-01 NOTE — Progress Notes (Signed)
Hematology and Oncology Follow Up Visit  Edward Bailey [id number] [birth year] 80 y.o. 11/01/2015 1:42 PM [name], MDGriffin, [name], MD   Principle Diagnosis: 80 year old gentleman with prostate cancer diagnosed in 1999 with a Gleason score 3+4 equals 7 and a PSA of around 4. He is currently experiencing a biochemical relapse without any measurable disease.   Prior Therapy:  He is status post prostatectomy and found to have stage T3b disease. He received salvage radiation therapy for a rise in his PSA.  He developed biochemical relapse and was treated with androgen depravation intermittently and most recently continuously with excellent response between 2010 to about 2015.  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   Current therapy: Ketoconazole at 200 mg twice a day and prednisone 5 mg daily. Prednisone have been discontinued because of gastrointestinal intolerance.  Interim History:  Mr. Edward Bailey presents today for a followup visit. Since the last visit, he reports feeling slightly better. His fatigue has slowly improved as his thyroid medication have been adjusted. He have discontinued methimazole at this time. He continues to be active and performs activities of daily living. He has not reported any changes in his appetite. He does not report any arthralgias or myalgias. Has not reported any recent falls or syncope. He continues to drive and have reasonable quality of life.  He is still on ketoconazole and does not report any other side effects associated with it. He is not reporting any lower extremity edema or easy bruisability. His abdominal pain have resolved since the last visit. Has not reported any nausea or vomiting. Has not reported any change in his bowel habits.    He does not report any dysuria or hematuria. He does report nocturia and frequency. He does not report any headaches, blurred vision  or syncope. He does not report any chest pain, shortness of breath, leg edema or palpitation. He does not report any wheezing, cough or hemoptysis. He does not report any skeletal complaints no arthralgias or myalgias. He is not report any lymphadenopathy or petechiae. The rest of his review of systems unremarkable.      Medications: I have reviewed the patient's current medications.  Current Outpatient Prescriptions  Medication Sig Dispense Refill  . aspirin (ASPIRIN EC) 81 MG EC tablet Take 81 mg by mouth daily. Swallow whole.    Marland Kitchen azelastine (ASTELIN) 0.1 % nasal spray Place 1-2 sprays into both nostrils daily as needed. allergies  5  . cetirizine (ZYRTEC) 10 MG tablet Take 10 mg by mouth daily as needed for allergies. As needed    . fluticasone (FLONASE) 50 MCG/ACT nasal spray Place 1 spray into both nostrils daily as needed. Allergies  5  . hydrocortisone 2.5 % lotion Apply 1 application topically at bedtime. Apply to scalp    . ketoconazole (NIZORAL) 200 MG tablet Take 1 tablet (200 mg total) by mouth 2 (two) times daily. 60 tablet 0  . Multiple Vitamins-Minerals (MULTIVITAMIN PO) Take 1 capsule by mouth daily.    Marland Kitchen triamcinolone (KENALOG) 0.025 % ointment Apply topically as directed.  0   No current facility-administered medications for this visit.     Allergies:  Allergies  Allergen Reactions  . Amoxicillin Rash    Has patient had a PCN reaction causing immediate rash, facial/tongue/throat swelling, SOB or lightheadedness with hypotension: No Has patient had a PCN reaction causing severe rash involving mucus membranes or skin necrosis: No Has patient had a PCN reaction  that required hospitalization No Has patient had a PCN reaction occurring within the last 10 years: Yes If all of the above answers are "NO", then may proceed with Cephalosporin use.     Past Medical History, Surgical history, Social history, and Family History were reviewed and updated.   Physical  Exam: Blood pressure 134/72, pulse 76, temperature 97.6 F (36.4 C), temperature source Oral, resp. rate 20, height 6' (1.829 m), weight 220 lb 3.2 oz (99.882 kg), SpO2 100 %. ECOG: 0 General appearance: Alert, awake gentleman appeared without distress. Head: Normocephalic, without obvious abnormality no scleral icterus noted. No oral ulcers or lesions. Neck: no adenopathy Lymph nodes: Cervical, supraclavicular, and axillary nodes normal. Heart:regular rate and rhythm, S1, S2 normal, no murmur, click, rub or gallop Lung:chest clear, no wheezing, rales, normal symmetric air entry Abdomin: soft, non-tender, without masses or organomegaly. No shifting dullness or ascites. EXT:no erythema, induration, or nodules.  Skin: No ecchymosis or erythema.   Lab Results: Lab Results  Component Value Date   WBC 7.0 10/05/2015   HGB 14.4 10/05/2015   HCT 43.8 10/05/2015   MCV 87.3 10/05/2015   PLT 300 10/05/2015     Chemistry      Component Value Date/Time   NA 138 10/05/2015 1104   NA 139 06/22/2008 1042   K 4.1 10/05/2015 1104   K 5.1 06/22/2008 1042   CL 104 06/22/2008 1042   CO2 26 10/05/2015 1104   CO2 28 06/22/2008 1042   BUN 17.9 10/05/2015 1104   BUN 22 06/22/2008 1042   CREATININE 1.2 10/05/2015 1104   CREATININE 1.24 06/22/2008 1042      Component Value Date/Time   CALCIUM 8.8 10/05/2015 1104   CALCIUM 9.5 06/22/2008 1042   ALKPHOS 105 10/05/2015 1104   ALKPHOS 66 06/22/2008 1042   AST 16 10/05/2015 1104   AST 21 06/22/2008 1042   ALT 13 10/05/2015 1104   ALT 14 06/22/2008 1042   BILITOT 0.42 10/05/2015 1104   BILITOT 0.6 06/22/2008 1042        Results for Edward Bailey, Edward Bailey (MRN [id number]) as of 11/01/2015 13:17  Ref. Range 07/04/2015 09:11 08/22/2015 11:12  PSA Latest Ref Range: <=4.00 ng/mL 3.00 4.68 (H)        Impression and Plan:  80 year-old gentleman with the following issues:   1. Prostate cancer diagnosed in 1999 with a Gleason score 3+4 equals 7  and a PSA of around 4. He is status post prostatectomy and found to have stage T3b disease. He developed a biochemical relapse and treated with intermittent androgen deprivation and currently has a rise in his PSA despite castrate levels testosterone. His CT scan on 04/12/2014 showed no evidence of any metastatic disease.   He is on ketoconazole and tolerated it well. His PSA Is increasing slowly although he is completely asymptomatic. The plan is to continue with the same dose and schedule and restage him with a CT scan and a bone scan. She has measurable disease and his PSA continues to rise, different treatment options will be discussed including Zytiga, Xtandi or systemic chemotherapy. His PSA continues to be relatively controlled and he has no measurable disease, then ketoconazole will be continued.  2. Androgen depravation: He was advised to continue this under the care of [facility].  3. Abdominal discomfort: CT scan obtained on on 03/29/2015 showed no metastatic disease or any abnormalities. His abdominal pain has improved.  4. Hyperthyroidism: He is of methimazole at this time. He might require  thyroid supplement in the future.  5. Thyroid nodule: He status post thyroid biopsy which showed no malignancy at this time.  6. Followup: In 6  weeks to repeat laboratory testing and imaging studies.      Zola Button, MD 4/11/20171:42 PM

## 2015-11-02 ENCOUNTER — Telehealth: Payer: Self-pay | Admitting: *Deleted

## 2015-11-02 LAB — PSA: PROSTATE SPECIFIC AG, SERUM: 5 ng/mL — AB (ref 0.0–4.0)

## 2015-11-02 LAB — PSA (PARALLEL TESTING): PSA: 4.79 ng/mL — AB (ref <=4.00)

## 2015-11-02 NOTE — Telephone Encounter (Signed)
-----   Message from Wyatt Portela, MD sent at 11/02/2015  8:17 AM EDT ----- Please let him know his PSA. No change for now.

## 2015-11-02 NOTE — Telephone Encounter (Signed)
Attempted to call PSA results. Mailbox is full and unable to leave message. Mailed copy of labs to patient's home.

## 2015-11-03 DIAGNOSIS — H5203 Hypermetropia, bilateral: Secondary | ICD-10-CM | POA: Diagnosis not present

## 2015-11-14 ENCOUNTER — Encounter: Payer: Self-pay | Admitting: *Deleted

## 2015-11-15 DIAGNOSIS — E0590 Thyrotoxicosis, unspecified without thyrotoxic crisis or storm: Secondary | ICD-10-CM | POA: Diagnosis not present

## 2015-11-23 ENCOUNTER — Other Ambulatory Visit: Payer: Self-pay | Admitting: Oncology

## 2015-12-12 ENCOUNTER — Other Ambulatory Visit (HOSPITAL_BASED_OUTPATIENT_CLINIC_OR_DEPARTMENT_OTHER): Payer: Medicare Other

## 2015-12-12 ENCOUNTER — Encounter (HOSPITAL_COMMUNITY)
Admission: RE | Admit: 2015-12-12 | Discharge: 2015-12-12 | Disposition: A | Discharge status: Home / Self Care | Payer: Medicare Other | Source: Ambulatory Visit | Attending: Oncology | Admitting: Oncology

## 2015-12-12 ENCOUNTER — Encounter (HOSPITAL_COMMUNITY): Payer: Self-pay

## 2015-12-12 ENCOUNTER — Ambulatory Visit (HOSPITAL_COMMUNITY)
Admission: RE | Admit: 2015-12-12 | Discharge: 2015-12-12 | Disposition: A | Discharge status: Home / Self Care | Payer: Medicare Other | Source: Ambulatory Visit | Attending: Oncology | Admitting: Oncology

## 2015-12-12 DIAGNOSIS — C61 Malignant neoplasm of prostate: Secondary | ICD-10-CM | POA: Diagnosis not present

## 2015-12-12 DIAGNOSIS — M899 Disorder of bone, unspecified: Secondary | ICD-10-CM | POA: Insufficient documentation

## 2015-12-12 DIAGNOSIS — Z8546 Personal history of malignant neoplasm of prostate: Secondary | ICD-10-CM | POA: Diagnosis not present

## 2015-12-12 LAB — CBC WITH DIFFERENTIAL/PLATELET
BASO%: 0.7 % (ref 0.0–2.0)
BASOS ABS: 0 10*3/uL (ref 0.0–0.1)
EOS%: 9 % — AB (ref 0.0–7.0)
Eosinophils Absolute: 0.5 10*3/uL (ref 0.0–0.5)
HCT: 41.4 % (ref 38.4–49.9)
HEMOGLOBIN: 13.9 g/dL (ref 13.0–17.1)
LYMPH%: 16.3 % (ref 14.0–49.0)
MCH: 30.1 pg (ref 27.2–33.4)
MCHC: 33.6 g/dL (ref 32.0–36.0)
MCV: 89.6 fL (ref 79.3–98.0)
MONO#: 0.6 10*3/uL (ref 0.1–0.9)
MONO%: 10.2 % (ref 0.0–14.0)
NEUT#: 3.8 10*3/uL (ref 1.5–6.5)
NEUT%: 63.8 % (ref 39.0–75.0)
Platelets: 229 10*3/uL (ref 140–400)
RBC: 4.62 10*6/uL (ref 4.20–5.82)
RDW: 15.9 % — AB (ref 11.0–14.6)
WBC: 5.9 10*3/uL (ref 4.0–10.3)
lymph#: 1 10*3/uL (ref 0.9–3.3)

## 2015-12-12 LAB — COMPREHENSIVE METABOLIC PANEL
ALBUMIN: 3.9 g/dL (ref 3.5–5.0)
ALK PHOS: 90 U/L (ref 40–150)
ALT: 14 U/L (ref 0–55)
ANION GAP: 9 meq/L (ref 3–11)
AST: 23 U/L (ref 5–34)
BILIRUBIN TOTAL: 0.49 mg/dL (ref 0.20–1.20)
BUN: 21.1 mg/dL (ref 7.0–26.0)
CALCIUM: 8.9 mg/dL (ref 8.4–10.4)
CO2: 26 meq/L (ref 22–29)
CREATININE: 1.4 mg/dL — AB (ref 0.7–1.3)
Chloride: 104 mEq/L (ref 98–109)
EGFR: 49 mL/min/{1.73_m2} — AB (ref >90)
Glucose: 109 mg/dl (ref 70–140)
Potassium: 4.1 mEq/L (ref 3.5–5.1)
Sodium: 139 mEq/L (ref 136–145)
TOTAL PROTEIN: 7 g/dL (ref 6.4–8.3)

## 2015-12-12 MED ORDER — TECHNETIUM TC 99M MEDRONATE IV KIT: 24.8000 | PACK | Freq: Once | INTRAVENOUS | Status: DC | PRN

## 2015-12-12 MED ORDER — IOPAMIDOL (ISOVUE-300) INJECTION 61%
100.0000 mL | Freq: Once | INTRAVENOUS | Status: AC | PRN
  Administered 2015-12-12: 80 mL via INTRAVENOUS

## 2015-12-13 ENCOUNTER — Other Ambulatory Visit: Payer: Medicare Other

## 2015-12-13 LAB — PSA: Prostate Specific Ag, Serum: 4.3 ng/mL — ABNORMAL HIGH (ref 0.0–4.0)

## 2015-12-15 ENCOUNTER — Telehealth: Payer: Self-pay | Admitting: Oncology

## 2015-12-15 ENCOUNTER — Ambulatory Visit (HOSPITAL_BASED_OUTPATIENT_CLINIC_OR_DEPARTMENT_OTHER): Payer: Medicare Other | Admitting: Oncology

## 2015-12-15 DIAGNOSIS — Z79899 Other long term (current) drug therapy: Secondary | ICD-10-CM

## 2015-12-15 DIAGNOSIS — E0520 Thyrotoxicosis with toxic multinodular goiter without thyrotoxic crisis or storm: Secondary | ICD-10-CM

## 2015-12-15 DIAGNOSIS — C61 Malignant neoplasm of prostate: Secondary | ICD-10-CM

## 2015-12-15 NOTE — Progress Notes (Signed)
Hematology and Oncology Follow Up Visit  Edward Bailey [id number] 1935-10-29 80 y.o. 12/15/2015 12:54 PM Edward Bailey, MDGriffin, [name], MD   Principle Diagnosis: 80 year old gentleman with prostate cancer diagnosed in 1999 with a Gleason score 3+4 equals 7 and a PSA of around 4. He is currently experiencing a biochemical relapse without any measurable disease.   Prior Therapy:  He is status post prostatectomy and found to have stage T3b disease. He received salvage radiation therapy for a rise in his PSA.  He developed biochemical relapse and was treated with androgen depravation intermittently and most recently continuously with excellent response between 2010 to about 2015.  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   Current therapy: Ketoconazole at 200 mg twice a day and prednisone 5 mg daily. Prednisone have been discontinued because of gastrointestinal intolerance.  Interim History:  Edward Bailey presents today for a followup visit. Since the last visit, he reports no major changes in his health. He continues to have fatigue related to his thyroid disease. Despite his fatigue, he is able to function properly and performs activities of daily living. He has not reported any changes in his appetite but has reported weight gain related to lack of exercise and thyroid disease. He does not report any arthralgias or myalgias. Has not reported any recent falls or syncope. He denied any joint pain or arthralgias.  He reports no new side effects related to ketoconazole. He is not reporting any lower extremity edema or easy bruisability. Has not reported any nausea or vomiting. Has not reported any change in his bowel habits.    He does not report any dysuria or hematuria. He does report nocturia and frequency. He does not report any headaches, blurred vision or syncope. He does not report any chest pain, shortness of  breath, leg edema or palpitation. He does not report any wheezing, cough or hemoptysis. He does not report any skeletal complaints. He is not report any lymphadenopathy or petechiae. The rest of his review of systems unremarkable.      Medications: I have reviewed the patient's current medications.  Current Outpatient Prescriptions  Medication Sig Dispense Refill  . aspirin (ASPIRIN EC) 81 MG EC tablet Take 81 mg by mouth daily. Swallow whole.    Marland Kitchen azelastine (ASTELIN) 0.1 % nasal spray Place 1-2 sprays into both nostrils daily as needed. allergies  5  . cetirizine (ZYRTEC) 10 MG tablet Take 10 mg by mouth daily as needed for allergies. As needed    . fluticasone (FLONASE) 50 MCG/ACT nasal spray Place 1 spray into both nostrils daily as needed. Allergies  5  . hydrocortisone 2.5 % lotion Apply 1 application topically at bedtime. Apply to scalp    . ketoconazole (NIZORAL) 200 MG tablet TAKE 1 TABLET BY MOUTH TWICE DAILY 60 tablet 0  . levothyroxine (SYNTHROID, LEVOTHROID) 50 MCG tablet take one tablet by mouth daily  5  . Multiple Vitamins-Minerals (MULTIVITAMIN PO) Take 1 capsule by mouth daily.    Marland Kitchen triamcinolone (KENALOG) 0.025 % ointment Apply topically as directed.  0   No current facility-administered medications for this visit.   Facility-Administered Medications Ordered in Other Visits  Medication Dose Route Frequency Provider Last Rate Last Dose  . technetium medronate (TC-MDP) injection 24.8 milli Curie  24.8 milli Curie Intravenous Once PRN [employer] MD         Allergies:  Allergies  Allergen Reactions  . Amoxicillin Rash  Has patient had a PCN reaction causing immediate rash, facial/tongue/throat swelling, SOB or lightheadedness with hypotension: No Has patient had a PCN reaction causing severe rash involving mucus membranes or skin necrosis: No Has patient had a PCN reaction that required hospitalization No Has patient had a PCN reaction occurring within the  last 10 years: Yes If all of the above answers are "NO", then may proceed with Cephalosporin use.     Past Medical History, Surgical history, Social history, and Family History were reviewed and updated.   Physical Exam: There were no vitals taken for this visit. ECOG: 0 General appearance: Pleasant-appearing gentleman without distress. Head: Normocephalic, without obvious abnormality no oral ulcers or lesions. No oral ulcers or lesions. Neck: no adenopathy Lymph nodes: Cervical, supraclavicular, and axillary nodes normal. Heart:regular rate and rhythm, S1, S2 normal, no murmur, click, rub or gallop Lung:chest clear, no wheezing, rales, normal symmetric air entry Abdomin: soft, non-tender, without masses or organomegaly. No rebound or guarding. EXT:no erythema, induration, or nodules.  Skin: No ecchymosis or erythema.   Lab Results: Lab Results  Component Value Date   WBC 5.9 12/12/2015   HGB 13.9 12/12/2015   HCT 41.4 12/12/2015   MCV 89.6 12/12/2015   PLT 229 12/12/2015     Chemistry      Component Value Date/Time   NA 139 12/12/2015 0907   NA 139 06/22/2008 1042   K 4.1 12/12/2015 0907   K 5.1 06/22/2008 1042   CL 104 06/22/2008 1042   CO2 26 12/12/2015 0907   CO2 28 06/22/2008 1042   BUN 21.1 12/12/2015 0907   BUN 22 06/22/2008 1042   CREATININE 1.4* 12/12/2015 0907   CREATININE 1.24 06/22/2008 1042      Component Value Date/Time   CALCIUM 8.9 12/12/2015 0907   CALCIUM 9.5 06/22/2008 1042   ALKPHOS 90 12/12/2015 0907   ALKPHOS 66 06/22/2008 1042   AST 23 12/12/2015 0907   AST 21 06/22/2008 1042   ALT 14 12/12/2015 0907   ALT 14 06/22/2008 1042   BILITOT 0.49 12/12/2015 0907   BILITOT 0.6 06/22/2008 1042       Results for Edward Bailey, Edward Bailey (MRN [id number]) as of 12/15/2015 12:58  Ref. Range 11/01/2015 13:11 12/12/2015 09:07  PSA Latest Ref Range: 0.0-4.0 ng/mL 5.0 (H) 4.3 (H)    EXAM: CT ABDOMEN AND PELVIS WITH  CONTRAST  TECHNIQUE: Multidetector CT imaging of the abdomen and pelvis was performed using the standard protocol following bolus administration of intravenous contrast.  CONTRAST: 71mL ISOVUE-300 IOPAMIDOL (ISOVUE-300) INJECTION 61%  COMPARISON: 03/29/2015, 04/02/2014 and 10/21/2009.  FINDINGS: Lower chest: Lung bases show no acute findings. Coronary artery calcification. Heart size normal. No pericardial or pleural effusion.  Hepatobiliary: Liver and gallbladder are unremarkable. No biliary ductal dilatation.  Pancreas: Negative.  Spleen: Negative.  Adrenals/Urinary Tract: Adrenal glands and kidneys are unremarkable. Ureters are decompressed. TURP defect is seen in the bladder which is otherwise grossly unremarkable.  Stomach/Bowel: Stomach, small bowel, appendix and colon are unremarkable.  Vascular/Lymphatic: Atherosclerotic calcification of the arterial vasculature without abdominal aortic aneurysm. No pathologically enlarged lymph nodes. Lymph node dissection clips are seen along the pelvic sidewalls bilaterally.  Reproductive: Surgically absent.  Other: No free fluid. Small bilateral inguinal hernias contain fat. Mesenteries and peritoneum are otherwise unremarkable.  Musculoskeletal: A sclerotic lesion in the left aspect of the sacrum is seen adjacent to the sacroiliac joint, measuring 1.3 x 1.8 cm (series 2, image 80), slightly increased in size from 03/29/2015, at which  time it measured 0.9 x 1.4 cm. It is new from 10/21/2009. No additional worrisome lytic or sclerotic lesions. Degenerative changes are seen in the spine.  IMPRESSION: 1. Sclerotic lesion in the left aspect of the sacrum is slightly larger than on recent prior examinations and new from 10/21/2009, raising suspicion for metastatic disease. No additional evidence of metastatic disease.    EXAM: NUCLEAR MEDICINE WHOLE BODY BONE SCAN  TECHNIQUE: Whole body anterior and  posterior images were obtained approximately 3 hours after intravenous injection of radiopharmaceutical.  RADIOPHARMACEUTICALS: 24.8 mCi Technetium-63m MDP IV  COMPARISON: Nuclear bone scan of January 29, 2014 and December 26, 2012.  FINDINGS: There is adequate uptake of the radiopharmaceutical by the skeleton. There is adequate soft tissue clearance and renal activity.  Uptake within the calvarium, spine, ribs, and pelvis is normal. There is a focus of increased uptake at the sternomanubrial junction which appears stable. Uptake within the upper and lower extremities is normal.  IMPRESSION: There are no findings suspicious for metastatic disease to the skeleton.     Impression and Plan:  80 year-old gentleman with the following issues:   1. Prostate cancer diagnosed in 1999 with a Gleason score 3+4 equals 7 and a PSA of around 4. He is status post prostatectomy and found to have stage T3b disease. He developed a biochemical relapse and treated with intermittent androgen deprivation his PSA was up to 17.17 without any measurable disease on imaging studies.  He has been on ketoconazole since September 2015 with a reasonable PSA response. His last PSA is at 4.3 after a slight drop from 5. His CT scan and bone scan were reviewed today and showed no clear-cut evidence of metastatic disease. His bone scan did not show any clear-cut osseous metastasis although his CT scan did show questionable area in the left sacrum.  Given his reasonable response to this therapy, I recommended continuing the same dose and schedule.  2. Androgen depravation: He was advised to continue this under the care of Lyndon.  3. Abdominal discomfort: This have resolved at this time.  4. Hyperthyroidism: He developed hypothyroidism at this time after radioablation. Currently on thyroid supplements.  5. Thyroid nodule: He status post thyroid biopsy which showed no malignancy at this time.  6. Followup: In  8 weeks to repeat laboratory testing and imaging studies.      Zola Button, MD 5/25/201712:54 PM

## 2015-12-15 NOTE — Telephone Encounter (Signed)
per pof to sch pt appt-gave pt copy of avs °

## 2015-12-20 DIAGNOSIS — E039 Hypothyroidism, unspecified: Secondary | ICD-10-CM | POA: Diagnosis not present

## 2015-12-26 ENCOUNTER — Telehealth: Payer: Self-pay | Admitting: Oncology

## 2015-12-26 ENCOUNTER — Other Ambulatory Visit: Payer: Self-pay | Admitting: Oncology

## 2015-12-26 NOTE — Telephone Encounter (Signed)
returned call and lvm for pt to call back with d.t to move lab

## 2015-12-27 ENCOUNTER — Telehealth: Payer: Self-pay | Admitting: Oncology

## 2015-12-27 NOTE — Telephone Encounter (Signed)
s.w. pt and advised on 7.28 labs moved to 7.25 per pt request..Marland KitchenMarland KitchenMarland Kitchenpt ok and aware

## 2016-01-06 DIAGNOSIS — B9789 Other viral agents as the cause of diseases classified elsewhere: Secondary | ICD-10-CM | POA: Diagnosis not present

## 2016-01-06 DIAGNOSIS — R0683 Snoring: Secondary | ICD-10-CM | POA: Diagnosis not present

## 2016-01-06 DIAGNOSIS — J069 Acute upper respiratory infection, unspecified: Secondary | ICD-10-CM | POA: Diagnosis not present

## 2016-01-18 DIAGNOSIS — E039 Hypothyroidism, unspecified: Secondary | ICD-10-CM | POA: Diagnosis not present

## 2016-01-25 DIAGNOSIS — C61 Malignant neoplasm of prostate: Secondary | ICD-10-CM | POA: Diagnosis not present

## 2016-01-30 ENCOUNTER — Other Ambulatory Visit: Payer: Self-pay | Admitting: Oncology

## 2016-02-14 ENCOUNTER — Other Ambulatory Visit (HOSPITAL_BASED_OUTPATIENT_CLINIC_OR_DEPARTMENT_OTHER): Payer: Medicare Other

## 2016-02-14 DIAGNOSIS — C61 Malignant neoplasm of prostate: Secondary | ICD-10-CM

## 2016-02-14 LAB — COMPREHENSIVE METABOLIC PANEL
ALBUMIN: 3.7 g/dL (ref 3.5–5.0)
ALK PHOS: 82 U/L (ref 40–150)
ALT: 13 U/L (ref 0–55)
ANION GAP: 9 meq/L (ref 3–11)
AST: 17 U/L (ref 5–34)
BUN: 19 mg/dL (ref 7.0–26.0)
CO2: 25 mEq/L (ref 22–29)
Calcium: 8.8 mg/dL (ref 8.4–10.4)
Chloride: 105 mEq/L (ref 98–109)
Creatinine: 1.4 mg/dL — ABNORMAL HIGH (ref 0.7–1.3)
EGFR: 49 mL/min/{1.73_m2} — AB (ref >90)
GLUCOSE: 104 mg/dL (ref 70–140)
POTASSIUM: 4.1 meq/L (ref 3.5–5.1)
SODIUM: 138 meq/L (ref 136–145)
Total Bilirubin: 0.49 mg/dL (ref 0.20–1.20)
Total Protein: 6.7 g/dL (ref 6.4–8.3)

## 2016-02-14 LAB — CBC WITH DIFFERENTIAL/PLATELET
BASO%: 1.1 % (ref 0.0–2.0)
BASOS ABS: 0.1 10*3/uL (ref 0.0–0.1)
EOS ABS: 0.6 10*3/uL — AB (ref 0.0–0.5)
EOS%: 9.3 % — ABNORMAL HIGH (ref 0.0–7.0)
HCT: 42.3 % (ref 38.4–49.9)
HEMOGLOBIN: 14.2 g/dL (ref 13.0–17.1)
LYMPH%: 17.5 % (ref 14.0–49.0)
MCH: 30.5 pg (ref 27.2–33.4)
MCHC: 33.6 g/dL (ref 32.0–36.0)
MCV: 91 fL (ref 79.3–98.0)
MONO#: 0.7 10*3/uL (ref 0.1–0.9)
MONO%: 10.1 % (ref 0.0–14.0)
NEUT#: 4.2 10*3/uL (ref 1.5–6.5)
NEUT%: 62 % (ref 39.0–75.0)
Platelets: 245 10*3/uL (ref 140–400)
RBC: 4.65 10*6/uL (ref 4.20–5.82)
RDW: 13.4 % (ref 11.0–14.6)
WBC: 6.7 10*3/uL (ref 4.0–10.3)
lymph#: 1.2 10*3/uL (ref 0.9–3.3)

## 2016-02-15 LAB — PSA: Prostate Specific Ag, Serum: 5.6 ng/mL — ABNORMAL HIGH (ref 0.0–4.0)

## 2016-02-17 ENCOUNTER — Other Ambulatory Visit: Payer: Medicare Other

## 2016-02-17 ENCOUNTER — Telehealth: Payer: Self-pay | Admitting: Oncology

## 2016-02-17 ENCOUNTER — Ambulatory Visit (HOSPITAL_BASED_OUTPATIENT_CLINIC_OR_DEPARTMENT_OTHER): Payer: Medicare Other | Admitting: Oncology

## 2016-02-17 VITALS — BP 133/74 | HR 65 | Temp 97.6°F | Resp 18 | Ht 72.0 in | Wt 233.1 lb

## 2016-02-17 DIAGNOSIS — E039 Hypothyroidism, unspecified: Secondary | ICD-10-CM | POA: Diagnosis not present

## 2016-02-17 DIAGNOSIS — C61 Malignant neoplasm of prostate: Secondary | ICD-10-CM

## 2016-02-17 DIAGNOSIS — E291 Testicular hypofunction: Secondary | ICD-10-CM | POA: Diagnosis not present

## 2016-02-17 NOTE — Progress Notes (Signed)
Hematology and Oncology Follow Up Visit  Edward Bailey [id number] 04/28/1936 80 y.o. 02/17/2016 1:51 PM Edward Bailey, MDGriffin, [name], MD   Principle Diagnosis: 80 year old gentleman with prostate cancer diagnosed in 1999 with a Gleason score 3+4 equals 7 and a PSA of around 4. He is currently experiencing a biochemical relapse without any measurable disease.   Prior Therapy:  He is status post prostatectomy and found to have stage T3b disease. He received salvage radiation therapy for a rise in his PSA.  He developed biochemical relapse and was treated with androgen depravation intermittently and most recently continuously with excellent response between 2010 to about 2015.  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   Current therapy: Ketoconazole at 200 mg twice a day and prednisone 5 mg daily. Prednisone have been discontinued because of gastrointestinal intolerance.  Interim History:  Edward Bailey presents today for a followup visit. Since the last visit, he continues to do very well without any major complaints. His thyroid medicine have been adjusted and his energy is improving slightly. He continues to gain weight but his amount of weight gain is limited. He does have mild fatigue associated with his thyroid disease but seems to be improving. He denied any bone pain or pathological fractures.  He reports no new side effects related to ketoconazole. He is not reporting any lower extremity edema or easy bruisability. Has not reported any nausea or vomiting. Has not reported any change in his bowel habits.    He does not report any dysuria or hematuria. He does report nocturia and frequency. He does not report any headaches, blurred vision or syncope. He does not report any chest pain, shortness of breath, leg edema or palpitation. He does not report any wheezing, cough or hemoptysis. He does not report any  skeletal complaints. He is not report any lymphadenopathy or petechiae. The rest of his review of systems unremarkable.      Medications: I have reviewed the patient's current medications.  Current Outpatient Prescriptions  Medication Sig Dispense Refill  . aspirin (ASPIRIN EC) 81 MG EC tablet Take 81 mg by mouth daily. Swallow whole.    Marland Kitchen azelastine (ASTELIN) 0.1 % nasal spray Place 1-2 sprays into both nostrils daily as needed. allergies  5  . cetirizine (ZYRTEC) 10 MG tablet Take 10 mg by mouth daily as needed for allergies. As needed    . fluticasone (FLONASE) 50 MCG/ACT nasal spray Place 1 spray into both nostrils daily as needed. Allergies  5  . hydrocortisone 2.5 % lotion Apply 1 application topically at bedtime. Apply to scalp    . ketoconazole (NIZORAL) 200 MG tablet TAKE 1 TABLET BY MOUTH TWICE DAILY 60 tablet 0  . levothyroxine (SYNTHROID, LEVOTHROID) 100 MCG tablet Take 100 mcg by mouth daily before breakfast.    . Multiple Vitamins-Minerals (MULTIVITAMIN PO) Take 1 capsule by mouth daily.     No current facility-administered medications for this visit.      Allergies:  Allergies  Allergen Reactions  . Amoxicillin Rash    Has patient had a PCN reaction causing immediate rash, facial/tongue/throat swelling, SOB or lightheadedness with hypotension: No Has patient had a PCN reaction causing severe rash involving mucus membranes or skin necrosis: No Has patient had a PCN reaction that required hospitalization No Has patient had a PCN reaction occurring within the last 10 years: Yes If all of the above answers are "NO", then may proceed with Cephalosporin use.  Past Medical History, Surgical history, Social history, and Family History were reviewed and updated.   Physical Exam: Blood pressure 133/74, pulse 65, temperature 97.6 F (36.4 C), resp. rate 18, height 6' (1.829 m), weight 233 lb 1.6 oz (105.7 kg), SpO2 99 %. ECOG: 0 General appearance: Alert, awake  gentleman without distress. Head: Normocephalic, without obvious abnormality no oral ulcers or lesions. No oral ulcers or lesions. Neck: no adenopathy Lymph nodes: Cervical, supraclavicular, and axillary nodes normal. Heart:regular rate and rhythm, S1, S2 normal, no murmur, click, rub or gallop Lung:chest clear, no wheezing, rales, normal symmetric air entry Abdomin: soft, non-tender, without masses or organomegaly. No shifting dullness or ascites. EXT:no erythema, induration, or nodules.  Skin: No ecchymosis or erythema.   Lab Results: Lab Results  Component Value Date   WBC 6.7 02/14/2016   HGB 14.2 02/14/2016   HCT 42.3 02/14/2016   MCV 91.0 02/14/2016   PLT 245 02/14/2016     Chemistry      Component Value Date/Time   NA 138 02/14/2016 0855   K 4.1 02/14/2016 0855   CL 104 06/22/2008 1042   CO2 25 02/14/2016 0855   BUN 19.0 02/14/2016 0855   CREATININE 1.4 (H) 02/14/2016 0855      Component Value Date/Time   CALCIUM 8.8 02/14/2016 0855   ALKPHOS 82 02/14/2016 0855   AST 17 02/14/2016 0855   ALT 13 02/14/2016 0855   BILITOT 0.49 02/14/2016 0855     Results for Edward Bailey (MRN [id number]) as of 02/17/2016 13:53  Ref. Range 11/01/2015 13:11 12/12/2015 09:07 02/14/2016 08:55  PSA Latest Ref Range: 0.0 - 4.0 ng/mL 5.0 (H) 4.3 (H) 5.6 (H)     Impression and Plan:  81 year old gentleman with the following issues:   1. Prostate cancer diagnosed in 1999 with a Gleason score 3+4 equals 7 and a PSA of around 4. He is status post prostatectomy and found to have stage T3b disease. He developed a biochemical relapse and treated with intermittent androgen deprivation his PSA was up to 17.17 without any measurable disease on imaging studies.  He has been on ketoconazole since September 2015 with a reasonable PSA response. His last PSA is slightly increased to 5.6 but overall under reasonable control. I recommended continuing the same dose and schedule and monitor his PSA  closely. If his PSA starts to rise rapidly, different salvage regimen will be used. History of workup from May 2017 did not show any measurable disease.   2. Androgen depravation: He was advised to continue this under the care of Edward Bailey.  3. Weight gain: This is related to hypothyroidism.  4. Hyperthyroidism: He developed hypothyroidism at this time after radioablation. Currently on thyroid supplements.  5. Thyroid nodule: He status post thyroid biopsy which showed no malignancy at this time.  6. Followup: In 8 weeks to repeat laboratory testing.       Zola Button, MD 7/28/20171:51 PM

## 2016-02-17 NOTE — Telephone Encounter (Signed)
Gave pt cal & avs °

## 2016-03-01 ENCOUNTER — Other Ambulatory Visit: Payer: Self-pay | Admitting: Oncology

## 2016-03-01 DIAGNOSIS — H10413 Chronic giant papillary conjunctivitis, bilateral: Secondary | ICD-10-CM | POA: Diagnosis not present

## 2016-03-02 DIAGNOSIS — E0590 Thyrotoxicosis, unspecified without thyrotoxic crisis or storm: Secondary | ICD-10-CM | POA: Diagnosis not present

## 2016-04-03 DIAGNOSIS — H10503 Unspecified blepharoconjunctivitis, bilateral: Secondary | ICD-10-CM | POA: Diagnosis not present

## 2016-04-03 DIAGNOSIS — Z23 Encounter for immunization: Secondary | ICD-10-CM | POA: Diagnosis not present

## 2016-04-03 DIAGNOSIS — H1032 Unspecified acute conjunctivitis, left eye: Secondary | ICD-10-CM | POA: Diagnosis not present

## 2016-04-04 ENCOUNTER — Other Ambulatory Visit: Payer: Self-pay | Admitting: Oncology

## 2016-04-13 ENCOUNTER — Other Ambulatory Visit (HOSPITAL_BASED_OUTPATIENT_CLINIC_OR_DEPARTMENT_OTHER): Payer: Medicare Other

## 2016-04-13 DIAGNOSIS — M791 Myalgia: Secondary | ICD-10-CM | POA: Diagnosis not present

## 2016-04-13 DIAGNOSIS — C61 Malignant neoplasm of prostate: Secondary | ICD-10-CM | POA: Diagnosis not present

## 2016-04-13 DIAGNOSIS — E0590 Thyrotoxicosis, unspecified without thyrotoxic crisis or storm: Secondary | ICD-10-CM | POA: Diagnosis not present

## 2016-04-13 LAB — COMPREHENSIVE METABOLIC PANEL
ALT: 13 U/L (ref 0–55)
ANION GAP: 9 meq/L (ref 3–11)
AST: 15 U/L (ref 5–34)
Albumin: 3.4 g/dL — ABNORMAL LOW (ref 3.5–5.0)
Alkaline Phosphatase: 85 U/L (ref 40–150)
BILIRUBIN TOTAL: 0.5 mg/dL (ref 0.20–1.20)
BUN: 21.3 mg/dL (ref 7.0–26.0)
CHLORIDE: 107 meq/L (ref 98–109)
CO2: 24 meq/L (ref 22–29)
Calcium: 8.9 mg/dL (ref 8.4–10.4)
Creatinine: 1.3 mg/dL (ref 0.7–1.3)
EGFR: 50 mL/min/{1.73_m2} — AB (ref >90)
Glucose: 143 mg/dl — ABNORMAL HIGH (ref 70–140)
Potassium: 4.2 mEq/L (ref 3.5–5.1)
Sodium: 140 mEq/L (ref 136–145)
Total Protein: 6.5 g/dL (ref 6.4–8.3)

## 2016-04-13 LAB — CBC WITH DIFFERENTIAL/PLATELET
BASO%: 0.4 % (ref 0.0–2.0)
Basophils Absolute: 0 10*3/uL (ref 0.0–0.1)
EOS ABS: 0.6 10*3/uL — AB (ref 0.0–0.5)
EOS%: 8.1 % — ABNORMAL HIGH (ref 0.0–7.0)
HCT: 41 % (ref 38.4–49.9)
HGB: 13.9 g/dL (ref 13.0–17.1)
LYMPH%: 14.4 % (ref 14.0–49.0)
MCH: 30.5 pg (ref 27.2–33.4)
MCHC: 33.9 g/dL (ref 32.0–36.0)
MCV: 89.9 fL (ref 79.3–98.0)
MONO#: 0.4 10*3/uL (ref 0.1–0.9)
MONO%: 6.1 % (ref 0.0–14.0)
NEUT#: 5 10*3/uL (ref 1.5–6.5)
NEUT%: 71 % (ref 39.0–75.0)
PLATELETS: 255 10*3/uL (ref 140–400)
RBC: 4.56 10*6/uL (ref 4.20–5.82)
RDW: 13.6 % (ref 11.0–14.6)
WBC: 7.1 10*3/uL (ref 4.0–10.3)
lymph#: 1 10*3/uL (ref 0.9–3.3)

## 2016-04-14 LAB — PSA: Prostate Specific Ag, Serum: 8.6 ng/mL — ABNORMAL HIGH (ref 0.0–4.0)

## 2016-04-17 ENCOUNTER — Ambulatory Visit (HOSPITAL_BASED_OUTPATIENT_CLINIC_OR_DEPARTMENT_OTHER): Payer: Medicare Other | Admitting: Oncology

## 2016-04-17 ENCOUNTER — Telehealth: Payer: Self-pay | Admitting: Oncology

## 2016-04-17 VITALS — BP 131/88 | HR 75 | Temp 97.5°F | Resp 18 | Ht 72.0 in | Wt 235.7 lb

## 2016-04-17 DIAGNOSIS — C61 Malignant neoplasm of prostate: Secondary | ICD-10-CM

## 2016-04-17 DIAGNOSIS — E039 Hypothyroidism, unspecified: Secondary | ICD-10-CM | POA: Diagnosis not present

## 2016-04-17 DIAGNOSIS — E0590 Thyrotoxicosis, unspecified without thyrotoxic crisis or storm: Secondary | ICD-10-CM | POA: Diagnosis not present

## 2016-04-17 DIAGNOSIS — R29898 Other symptoms and signs involving the musculoskeletal system: Secondary | ICD-10-CM | POA: Diagnosis not present

## 2016-04-17 DIAGNOSIS — R635 Abnormal weight gain: Secondary | ICD-10-CM

## 2016-04-17 DIAGNOSIS — Z79899 Other long term (current) drug therapy: Secondary | ICD-10-CM

## 2016-04-17 NOTE — Progress Notes (Signed)
Hematology and Oncology Follow Up Visit  EARNST COONTS [id number] 1936/05/11 80 y.o. 04/17/2016 10:43 AM [name], MDGriffin, [name], MD   Principle Diagnosis: 80 year old gentleman with prostate cancer diagnosed in 1999 with a Gleason score 3+4 equals 7 and a PSA of around 4. He is currently experiencing a biochemical relapse without any measurable disease.   Prior Therapy:  He is status post prostatectomy and found to have stage T3b disease. He received salvage radiation therapy for a rise in his PSA.  He developed biochemical relapse and was treated with androgen depravation intermittently and most recently continuously with excellent response between 2010 to about 2015.  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   Current therapy: Ketoconazole at 200 mg twice a day and prednisone 5 mg daily. Prednisone have been discontinued because of gastrointestinal intolerance.  Interim History:  Mr. Kerkhoff presents today for a followup visit. Since the last visit, he reports no major changes in his health. He continues to have struggles with fatigue and weight related to his thyroid disease.He reports no new side effects related to ketoconazole. He is not reporting any lower extremity edema or easy bruisability. Has not reported any nausea or vomiting. Has not reported any change in his bowel habits. He does not report any bone pain or pathological fractures. He remains active and performs activities of daily living.   He does not report any dysuria or hematuria. He does report nocturia and frequency. He does not report any headaches, blurred vision or syncope. He does not report any chest pain, shortness of breath, leg edema or palpitation. He does not report any wheezing, cough or hemoptysis. He does not report any skeletal complaints. He is not report any lymphadenopathy or petechiae. The rest of his review of systems  unremarkable.      Medications: I have reviewed the patient's current medications.  Current Outpatient Prescriptions  Medication Sig Dispense Refill  . aspirin (ASPIRIN EC) 81 MG EC tablet Take 81 mg by mouth daily. Swallow whole.    Marland Kitchen azelastine (ASTELIN) 0.1 % nasal spray Place 1-2 sprays into both nostrils daily as needed. allergies  5  . cetirizine (ZYRTEC) 10 MG tablet Take 10 mg by mouth daily as needed for allergies. As needed    . fluticasone (FLONASE) 50 MCG/ACT nasal spray Place 1 spray into both nostrils daily as needed. Allergies  5  . hydrocortisone 2.5 % lotion Apply 1 application topically at bedtime. Apply to scalp    . ketoconazole (NIZORAL) 200 MG tablet TAKE 1 TABLET BY MOUTH TWICE DAILY 60 tablet 0  . levothyroxine (SYNTHROID, LEVOTHROID) 112 MCG tablet TK 1 T PO QD IN THE MORNING OES  12  . Multiple Vitamins-Minerals (MULTIVITAMIN PO) Take 1 capsule by mouth daily.     No current facility-administered medications for this visit.      Allergies:  Allergies  Allergen Reactions  . Amoxicillin Rash    Has patient had a PCN reaction causing immediate rash, facial/tongue/throat swelling, SOB or lightheadedness with hypotension: No Has patient had a PCN reaction causing severe rash involving mucus membranes or skin necrosis: No Has patient had a PCN reaction that required hospitalization No Has patient had a PCN reaction occurring within the last 10 years: Yes If all of the above answers are "NO", then may proceed with Cephalosporin use.     Past Medical History, Surgical history, Social history, and Family History were reviewed and updated.  Physical Exam: Blood pressure 131/88, pulse 75, temperature 97.5 F (36.4 C), temperature source Oral, resp. rate 18, height 6' (1.829 m), weight 235 lb 11.2 oz (106.9 kg), SpO2 99 %. ECOG: 0 General appearance: Well-appearing gentleman without distress. Head: Normocephalic, without obvious abnormality no oral thrush  noted. No oral ulcers or lesions. Neck: no adenopathy Lymph nodes: Cervical, supraclavicular, and axillary nodes normal. Heart:regular rate and rhythm, S1, S2 normal, no murmur, click, rub or gallop Lung:chest clear, no wheezing, rales, normal symmetric air entry Abdomin: soft, non-tender, without masses or organomegaly. No rebound or guarding. EXT:no erythema, induration, or nodules.  Skin: No ecchymosis or erythema.   Lab Results: Lab Results  Component Value Date   WBC 7.1 04/13/2016   HGB 13.9 04/13/2016   HCT 41.0 04/13/2016   MCV 89.9 04/13/2016   PLT 255 04/13/2016     Chemistry      Component Value Date/Time   NA 140 04/13/2016 1007   K 4.2 04/13/2016 1007   CL 104 06/22/2008 1042   CO2 24 04/13/2016 1007   BUN 21.3 04/13/2016 1007   CREATININE 1.3 04/13/2016 1007      Component Value Date/Time   CALCIUM 8.9 04/13/2016 1007   ALKPHOS 85 04/13/2016 1007   AST 15 04/13/2016 1007   ALT 13 04/13/2016 1007   BILITOT 0.50 04/13/2016 1007      Results for ABUKAR, HARP (MRN [id number]) as of 04/17/2016 10:23  Ref. Range 12/12/2015 09:07 02/14/2016 08:55 04/13/2016 10:07  PSA Latest Ref Range: 0.0 - 4.0 ng/mL 4.3 (H) 5.6 (H) 8.6 (H)     Impression and Plan:  80 year old gentleman with the following issues:   1. Prostate cancer diagnosed in 1999 with a Gleason score 3+4 equals 7 and a PSA of around 4. He is status post prostatectomy and found to have stage T3b disease. He developed a biochemical relapse and treated with intermittent androgen deprivation his PSA was up to 17.17 without any measurable disease on imaging studies.  He has been on ketoconazole since September 2015 with a reasonable PSA response. His last PSA Did increase with a more rapid doubling time less than 6 months. His last imaging studies done in May 2017 did not show any evidence of recurrent disease. The plan is to continue on this current medication and monitor his PSA. If he continues to  develop a rapid rise in his PSA different salvage therapy will be utilized. Staging workup will be repeated at that time.   2. Androgen depravation: He was advised to continue this under the care of Pepin.  3. Weight gain: This is related to hypothyroidism. No major changes at this time.  4. Hyperthyroidism: He developed hypothyroidism at this time after radioablation. Currently on thyroid supplements.  5. Followup: In 8 weeks to repeat laboratory testing.       [facility], MD 9/26/201710:43 AM

## 2016-04-17 NOTE — Telephone Encounter (Signed)
Gv pt appts for 10/31 + 11/2.

## 2016-05-07 ENCOUNTER — Other Ambulatory Visit: Payer: Self-pay | Admitting: Oncology

## 2016-05-22 ENCOUNTER — Other Ambulatory Visit (HOSPITAL_BASED_OUTPATIENT_CLINIC_OR_DEPARTMENT_OTHER): Payer: Medicare Other

## 2016-05-22 DIAGNOSIS — C61 Malignant neoplasm of prostate: Secondary | ICD-10-CM

## 2016-05-22 LAB — CBC WITH DIFFERENTIAL/PLATELET
BASO%: 0.8 % (ref 0.0–2.0)
BASOS ABS: 0.1 10*3/uL (ref 0.0–0.1)
EOS ABS: 0.6 10*3/uL — AB (ref 0.0–0.5)
EOS%: 8.2 % — AB (ref 0.0–7.0)
HCT: 45.2 % (ref 38.4–49.9)
HGB: 14.7 g/dL (ref 13.0–17.1)
LYMPH%: 14.8 % (ref 14.0–49.0)
MCH: 29.3 pg (ref 27.2–33.4)
MCHC: 32.6 g/dL (ref 32.0–36.0)
MCV: 89.8 fL (ref 79.3–98.0)
MONO#: 0.4 10*3/uL (ref 0.1–0.9)
MONO%: 6.2 % (ref 0.0–14.0)
NEUT#: 4.9 10*3/uL (ref 1.5–6.5)
NEUT%: 70 % (ref 39.0–75.0)
Platelets: 298 10*3/uL (ref 140–400)
RBC: 5.04 10*6/uL (ref 4.20–5.82)
RDW: 13.4 % (ref 11.0–14.6)
WBC: 7 10*3/uL (ref 4.0–10.3)
lymph#: 1 10*3/uL (ref 0.9–3.3)

## 2016-05-22 LAB — COMPREHENSIVE METABOLIC PANEL
ALT: 15 U/L (ref 0–55)
ANION GAP: 11 meq/L (ref 3–11)
AST: 16 U/L (ref 5–34)
Albumin: 3.7 g/dL (ref 3.5–5.0)
Alkaline Phosphatase: 93 U/L (ref 40–150)
BUN: 20.9 mg/dL (ref 7.0–26.0)
CALCIUM: 9.2 mg/dL (ref 8.4–10.4)
CHLORIDE: 105 meq/L (ref 98–109)
CO2: 24 meq/L (ref 22–29)
CREATININE: 1.5 mg/dL — AB (ref 0.7–1.3)
EGFR: 45 mL/min/{1.73_m2} — AB (ref >90)
Glucose: 135 mg/dl (ref 70–140)
Potassium: 3.9 mEq/L (ref 3.5–5.1)
Sodium: 139 mEq/L (ref 136–145)
Total Bilirubin: 0.51 mg/dL (ref 0.20–1.20)
Total Protein: 7.3 g/dL (ref 6.4–8.3)

## 2016-05-23 LAB — TESTOSTERONE

## 2016-05-23 LAB — PSA: PROSTATE SPECIFIC AG, SERUM: 12.3 ng/mL — AB (ref 0.0–4.0)

## 2016-05-24 ENCOUNTER — Ambulatory Visit (HOSPITAL_BASED_OUTPATIENT_CLINIC_OR_DEPARTMENT_OTHER): Payer: Medicare Other | Admitting: Oncology

## 2016-05-24 ENCOUNTER — Telehealth: Payer: Self-pay | Admitting: Oncology

## 2016-05-24 VITALS — BP 135/79 | HR 76 | Temp 97.9°F | Resp 18 | Ht 72.0 in | Wt 235.0 lb

## 2016-05-24 DIAGNOSIS — E291 Testicular hypofunction: Secondary | ICD-10-CM | POA: Diagnosis not present

## 2016-05-24 DIAGNOSIS — C61 Malignant neoplasm of prostate: Secondary | ICD-10-CM | POA: Diagnosis not present

## 2016-05-24 DIAGNOSIS — E039 Hypothyroidism, unspecified: Secondary | ICD-10-CM | POA: Diagnosis not present

## 2016-05-24 MED ORDER — PREDNISOLONE 5 MG PO TABS: 5.0000 mg | ORAL_TABLET | Freq: Every day | ORAL | 0 refills | Status: DC

## 2016-05-24 MED ORDER — ABIRATERONE ACETATE 250 MG PO TABS: 1000.0000 mg | ORAL_TABLET | Freq: Every day | ORAL | 0 refills | Status: DC

## 2016-05-24 NOTE — Progress Notes (Signed)
Hematology and Oncology Follow Up Visit  [name] [id number] 1935-12-21 80 y.o. 05/24/2016 10:34 AM Edward Bailey, MDGriffin, [name], MD   Principle Diagnosis: 80 year old gentleman with prostate cancer diagnosed in 1999 with a Gleason score 3+4 equals 7 and a PSA of around 4. He is currently experiencing a biochemical relapse without any measurable disease.   Prior Therapy:  He is status post prostatectomy and found to have stage T3b disease. He received salvage radiation therapy for a rise in his PSA.  He developed biochemical relapse and was treated with androgen depravation intermittently and most recently continuously with excellent response between 2010 to about 2015.  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 Ketoconazole at 200 mg twice a day and prednisone 5 mg daily. Therapy discontinued on 05/24/2016 because of progression of disease.  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  Interim History:  Edward Bailey presents today for a followup visit. Since the last visit, he reports doing reasonably well. He does not report any new complaints.  He continues to have fatigue related to thyroid disease.He remains on thyroid replacement regarding that.   He reports no new side effects related to ketoconazole. He is not reporting any lower extremity edema or easy bruisability. Has not reported any nausea or vomiting. Has not reported any change in his bowel habits. He does not report any bone pain or pathological fractures. He remains active and performs activities of daily living.  He does not report any dysuria or hematuria. He does report nocturia and frequency. He does not report any headaches, blurred vision or syncope. He does not report any chest pain, shortness of breath, leg edema or palpitation. He does not report any wheezing, cough or hemoptysis.  He does not report any skeletal complaints. He is not report any lymphadenopathy or petechiae. The rest of his review of systems unremarkable.      Medications: I have reviewed the patient's current medications.  Current Outpatient Prescriptions  Medication Sig Dispense Refill  . aspirin (ASPIRIN EC) 81 MG EC tablet Take 81 mg by mouth daily. Swallow whole.    Marland Kitchen azelastine (ASTELIN) 0.1 % nasal spray Place 1-2 sprays into both nostrils daily as needed. allergies  5  . cetirizine (ZYRTEC) 10 MG tablet Take 10 mg by mouth daily as needed for allergies. As needed    . fluticasone (FLONASE) 50 MCG/ACT nasal spray Place 1 spray into both nostrils daily as needed. Allergies  5  . hydrocortisone 2.5 % lotion Apply 1 application topically at bedtime. Apply to scalp    . levothyroxine (SYNTHROID, LEVOTHROID) 112 MCG tablet TK 1 T PO QD IN THE MORNING OES  12  . Multiple Vitamins-Minerals (MULTIVITAMIN PO) Take 1 capsule by mouth daily.    Marland Kitchen abiraterone Acetate (ZYTIGA) 250 MG tablet Take 4 tablets (1,000 mg total) by mouth daily. Take on an empty stomach 1 hour before or 2 hours after a meal 120 tablet 0  . prednisoLONE 5 MG TABS tablet Take 1 tablet (5 mg total) by mouth daily. 90 each 0   No current facility-administered medications for this visit.      Allergies:  Allergies  Allergen Reactions  . Amoxicillin Rash    Has patient had a PCN reaction causing immediate rash, facial/tongue/throat swelling, SOB or lightheadedness with hypotension: No Has patient had a PCN reaction causing severe rash involving mucus membranes or skin necrosis: No Has  patient had a PCN reaction that required hospitalization No Has patient had a PCN reaction occurring within the last 10 years: Yes If all of the above answers are "NO", then may proceed with Cephalosporin use.     Past Medical History, Surgical history, Social history, and Family History were reviewed and updated.   Physical Exam: Blood pressure  135/79, pulse 76, temperature 97.9 F (36.6 C), temperature source Oral, resp. rate 18, height 6' (1.829 m), weight 235 lb (106.6 kg), SpO2 99 %. ECOG: 0 General appearance: Well-appearing gentleman without distress. Head: Normocephalic, without obvious abnormality no oral ulcers or lesions. No oral ulcers or lesions. Neck: no adenopathy Lymph nodes: Cervical, supraclavicular, and axillary nodes normal. Heart:regular rate and rhythm, S1, S2 normal, no murmur, click, rub or gallop Lung:chest clear, no wheezing, rales, normal symmetric air entry Abdomin: soft, non-tender, without masses or organomegaly. No shifting dullness or ascites. EXT:no erythema, induration, or nodules.  Skin: No ecchymosis or erythema.   Lab Results: Lab Results  Component Value Date   WBC 7.0 05/22/2016   HGB 14.7 05/22/2016   HCT 45.2 05/22/2016   MCV 89.8 05/22/2016   PLT 298 05/22/2016     Chemistry      Component Value Date/Time   NA 139 05/22/2016 1020   K 3.9 05/22/2016 1020   CL 104 06/22/2008 1042   CO2 24 05/22/2016 1020   BUN 20.9 05/22/2016 1020   CREATININE 1.5 (H) 05/22/2016 1020      Component Value Date/Time   CALCIUM 9.2 05/22/2016 1020   ALKPHOS 93 05/22/2016 1020   AST 16 05/22/2016 1020   ALT 15 05/22/2016 1020   BILITOT 0.51 05/22/2016 1020       Results for Edward Bailey, Edward Bailey (MRN [id number]) as of 05/24/2016 10:37  Ref. Range 02/14/2016 08:55 04/13/2016 10:07 05/22/2016 10:20  PSA Latest Ref Range: 0.0 - 4.0 ng/mL 5.6 (H) 8.6 (H) 12.3 (H)     Impression and Plan:  80 year old gentleman with the following issues:   1. Prostate cancer diagnosed in 1999 with a Gleason score 3+4 equals 7 and a PSA of around 4. He is status post prostatectomy and found to have stage T3b disease. He developed a biochemical relapse and treated with intermittent androgen deprivation his PSA was up to 17.17 without any measurable disease on imaging studies.  His last imaging studies done in May  2017 that showed possible sacral metastasis.  He has been on ketoconazole since September 2015 with a reasonable PSA response. His PSA continues to rise with a doubling time of less than 6 months most recently up to 12.3.  Given these findings I have recommended discontinuation of this medication and different salvage therapy to be considered. These options include Zytiga, Xtandi and systemic chemotherapy. Risks and benefits of all these options were reviewed today and given his excellent response to ketoconazole he would benefit from [place]. Complications associated with this medication include hypertension, hypokalemia and lower extremity edema. I have recommended that total dose of 1000 mg with 5 mg of prednisone. He is agreeable to proceed with that at this time pending his insurance coverage. Other options would include Xtandi which was also discussed as an alternative and he is willing to try that in case Fabio Asa is not covered.   2. Androgen depravation: He was advised to continue this under the care of [place].  3. Weight gain: This is related to hypothyroidism. His weight remains stable.  4. Hyperthyroidism: He developed hypothyroidism at this time  after radioablation. Currently on thyroid supplements.  5. Followup: In 6 weeks to follow his clinical progress and tolerance to this new medication.      [id number], MD 11/2/201710:34 AM

## 2016-05-24 NOTE — Telephone Encounter (Signed)
Gave patient avs report and appointments for December  °

## 2016-05-28 ENCOUNTER — Encounter: Payer: Self-pay | Admitting: Pharmacist

## 2016-05-28 ENCOUNTER — Encounter: Payer: Self-pay | Admitting: Oncology

## 2016-05-28 NOTE — Progress Notes (Signed)
Prior authorization request received. Completed request through Cover My Meds. It has been submitted to [facility] and decision will be notified directly typically within 3 business days.

## 2016-05-29 NOTE — Progress Notes (Signed)
Oral Chemotherapy Pharmacist Encounter  Received prescription for Zytiga to be taken concurrently with prednisone. Labs from 05/22/16 reviewed, ok for treatment Current medication list in Epic assessed, no DDIs with Zytiga identified  Prescription will be sent to [facility] for benefits analysis  Johny Drilling, PharmD, BCPS 05/29/2016  8:36 AM Oral Chemotherapy Clinic [phone]

## 2016-05-30 ENCOUNTER — Other Ambulatory Visit: Payer: Self-pay | Admitting: Urology

## 2016-05-30 DIAGNOSIS — Z192 Hormone resistant malignancy status: Secondary | ICD-10-CM | POA: Diagnosis not present

## 2016-05-30 DIAGNOSIS — R9721 Rising PSA following treatment for malignant neoplasm of prostate: Secondary | ICD-10-CM | POA: Diagnosis not present

## 2016-05-30 DIAGNOSIS — C61 Malignant neoplasm of prostate: Secondary | ICD-10-CM | POA: Diagnosis not present

## 2016-05-30 NOTE — Progress Notes (Signed)
Oral Chemotherapy Pharmacist Encounter  Received notification from [employer] that [place] prescritption would require prior authorization PA submitted on [email or web address] [place] [id number] PA is pending, additional information is required.  Oral Chemo Clinic will continue to follow.  Johny Drilling, PharmD, BCPS 05/30/2016  9:35 AM Oral Chemotherapy Clinic [phone]

## 2016-06-04 ENCOUNTER — Encounter: Payer: Self-pay | Admitting: Oncology

## 2016-06-04 ENCOUNTER — Telehealth: Payer: Self-pay | Admitting: Pharmacist

## 2016-06-04 ENCOUNTER — Other Ambulatory Visit: Payer: Self-pay | Admitting: *Deleted

## 2016-06-04 MED ORDER — PREDNISONE 5 MG PO TABS: 5.0000 mg | ORAL_TABLET | Freq: Every day | ORAL | 0 refills | Status: DC

## 2016-06-04 MED FILL — ZYTIGA 250 MG TABLET: 250 | 30 days supply | Qty: 120 | Fill #0

## 2016-06-04 MED FILL — predniSONE 5 MG TABS: 5 | 90 days supply | Qty: 90 | Fill #0

## 2016-06-04 NOTE — Progress Notes (Signed)
Denial received via phone for [place] on 05/28/16 from [email or web address] She stated at that time a denial letter would be sent and an appeal would need to be filed with a letter including the patient's name, id number and date of birth. The number to fax appeal to is [phone]. I have not received actual denial letter as of today.

## 2016-06-04 NOTE — Telephone Encounter (Signed)
Called pt to discuss options for assistance w/ Zytiga.  Copay amt $2700. Pt states he makes >$94,000/year combined so is therefore ineligible for [employer] due to combined 2 person household limit of $64,960 for their program.    Pt is willing to pay his copay of $2700 for 1-2 months until the first of the new year when he would be willing to complete enrollment forms for co-pay assistance programs whose funding would reopen at that time.  [facility] is aware pt will come there to p/u Zytiga.  I left vm for Dr. Hazeline Junker RN to have Rx for PREDNISONE 5 mg sent over to [facility].  Pt understands how to take Prednisone w/ Zytiga.  I provided an overview of Zytiga. Counseled patient on administration, dosing, side effects and monitoring. Side effects include but not limited to: fatigue, diarrhea, anemia, elevated cholesterol, flushing, nausea.  He voiced understanding.  All current questions answered.  Will follow up in 1-2 weeks for adherence and toxicity management. At the first of the year, we'll try to enroll him for copay assistance if he tolerates this tx.  Thank you, Kennith Center, Pharm.D., CPP 06/04/2016@1 :14 PM Oral Chemotherapy Clinic

## 2016-06-05 ENCOUNTER — Encounter: Payer: Self-pay | Admitting: Oncology

## 2016-06-06 ENCOUNTER — Encounter: Payer: Self-pay | Admitting: Oncology

## 2016-06-19 ENCOUNTER — Encounter (HOSPITAL_COMMUNITY)
Admission: RE | Admit: 2016-06-19 | Discharge: 2016-06-19 | Disposition: A | Discharge status: Home / Self Care | Payer: Medicare Other | Source: Ambulatory Visit | Attending: Urology | Admitting: Urology

## 2016-06-19 DIAGNOSIS — C61 Malignant neoplasm of prostate: Secondary | ICD-10-CM

## 2016-06-19 DIAGNOSIS — E039 Hypothyroidism, unspecified: Secondary | ICD-10-CM | POA: Diagnosis not present

## 2016-06-19 DIAGNOSIS — Z8546 Personal history of malignant neoplasm of prostate: Secondary | ICD-10-CM | POA: Diagnosis not present

## 2016-06-19 MED ORDER — TECHNETIUM TC 99M MEDRONATE IV KIT
22.0000 | PACK | Freq: Once | INTRAVENOUS | Status: AC | PRN
  Administered 2016-06-19: 22 via INTRAVENOUS

## 2016-06-28 ENCOUNTER — Other Ambulatory Visit: Payer: Self-pay | Admitting: Oncology

## 2016-06-28 MED FILL — ZYTIGA 250 MG TABLET: 250 | 30 days supply | Qty: 120 | Fill #0

## 2016-07-03 ENCOUNTER — Other Ambulatory Visit (HOSPITAL_BASED_OUTPATIENT_CLINIC_OR_DEPARTMENT_OTHER): Payer: Medicare Other

## 2016-07-03 DIAGNOSIS — C61 Malignant neoplasm of prostate: Secondary | ICD-10-CM

## 2016-07-03 LAB — COMPREHENSIVE METABOLIC PANEL
ALBUMIN: 3.4 g/dL — AB (ref 3.5–5.0)
ALK PHOS: 94 U/L (ref 40–150)
ALT: 13 U/L (ref 0–55)
ANION GAP: 8 meq/L (ref 3–11)
AST: 15 U/L (ref 5–34)
BUN: 19.2 mg/dL (ref 7.0–26.0)
CALCIUM: 8.8 mg/dL (ref 8.4–10.4)
CHLORIDE: 106 meq/L (ref 98–109)
CO2: 26 mEq/L (ref 22–29)
Creatinine: 1.2 mg/dL (ref 0.7–1.3)
EGFR: 60 mL/min/{1.73_m2} — AB (ref >90)
Glucose: 108 mg/dl (ref 70–140)
POTASSIUM: 3.7 meq/L (ref 3.5–5.1)
Sodium: 140 mEq/L (ref 136–145)
Total Bilirubin: 0.6 mg/dL (ref 0.20–1.20)
Total Protein: 6.6 g/dL (ref 6.4–8.3)

## 2016-07-03 LAB — CBC WITH DIFFERENTIAL/PLATELET
BASO%: 0.7 % (ref 0.0–2.0)
BASOS ABS: 0.1 10*3/uL (ref 0.0–0.1)
EOS ABS: 0.6 10*3/uL — AB (ref 0.0–0.5)
EOS%: 8.1 % — AB (ref 0.0–7.0)
HEMATOCRIT: 41.7 % (ref 38.4–49.9)
HEMOGLOBIN: 13.9 g/dL (ref 13.0–17.1)
LYMPH#: 1.2 10*3/uL (ref 0.9–3.3)
LYMPH%: 16.9 % (ref 14.0–49.0)
MCH: 29.8 pg (ref 27.2–33.4)
MCHC: 33.3 g/dL (ref 32.0–36.0)
MCV: 89.3 fL (ref 79.3–98.0)
MONO#: 0.7 10*3/uL (ref 0.1–0.9)
MONO%: 9.1 % (ref 0.0–14.0)
NEUT#: 4.7 10*3/uL (ref 1.5–6.5)
NEUT%: 65.2 % (ref 39.0–75.0)
PLATELETS: 263 10*3/uL (ref 140–400)
RBC: 4.67 10*6/uL (ref 4.20–5.82)
RDW: 13.7 % (ref 11.0–14.6)
WBC: 7.2 10*3/uL (ref 4.0–10.3)

## 2016-07-04 LAB — PSA: Prostate Specific Ag, Serum: 6.7 ng/mL — ABNORMAL HIGH (ref 0.0–4.0)

## 2016-07-05 ENCOUNTER — Telehealth: Payer: Self-pay | Admitting: Oncology

## 2016-07-05 ENCOUNTER — Ambulatory Visit (HOSPITAL_BASED_OUTPATIENT_CLINIC_OR_DEPARTMENT_OTHER): Payer: Medicare Other | Admitting: Oncology

## 2016-07-05 VITALS — BP 139/62 | HR 66 | Temp 97.5°F | Resp 18 | Ht 72.0 in | Wt 240.0 lb

## 2016-07-05 DIAGNOSIS — E039 Hypothyroidism, unspecified: Secondary | ICD-10-CM

## 2016-07-05 DIAGNOSIS — C61 Malignant neoplasm of prostate: Secondary | ICD-10-CM | POA: Diagnosis not present

## 2016-07-05 DIAGNOSIS — E291 Testicular hypofunction: Secondary | ICD-10-CM | POA: Diagnosis not present

## 2016-07-05 NOTE — Telephone Encounter (Signed)
Appointments scheduled per 07/05/16 los. Patient was given a copy of the AVS report and appointment schedule, per 07/05/16 los. °

## 2016-07-05 NOTE — Progress Notes (Signed)
Hematology and Oncology Follow Up Visit  YAMIR PAEZ [id number] [birth year] 80 y.o. 07/05/2016 10:27 AM [name], MDGriffin, [name], MD   Principle Diagnosis: 80 year old gentleman with prostate cancer diagnosed in 1999 with a Gleason score 3+4 equals 7 and a PSA of around 4. He is currently experiencing a biochemical relapse without any measurable disease.   Prior Therapy:  He is status post prostatectomy and found to have stage T3b disease. He received salvage radiation therapy for a rise in his PSA.  He developed biochemical relapse and was treated with androgen depravation intermittently and most recently continuously with excellent response between 2010 to about 2015.  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 Ketoconazole at 200 mg twice a day and prednisone 5 mg daily. Therapy discontinued on 05/24/2016 because of progression of disease.  Current therapy: Zytiga 1000 mg daily with prednisone at 5 mg daily started in November 2017.  Interim History:  Mr. Duddy presents today for a followup visit. Since the last visit, he started Zytiga and have tolerated it well. He denied any nausea, abdominal pain or edema. He denied any fatigue or tiredness. His appetite remains excellent and has not lost any weight. He continues to have fatigue related to thyroid disease.He remains on thyroid replacement regarding that.   His performance status remains excellent and continues to attend activities of daily living. He denied any pathological fractures, hip pain or any bone pain.  He does not report any dysuria or hematuria. He does report nocturia and frequency. He does not report any headaches, blurred vision or syncope. He does not report any chest pain, shortness of breath, leg edema or palpitation. He does not report any wheezing, cough or hemoptysis. He does not report any skeletal complaints. He is not report  any lymphadenopathy or petechiae. The rest of his review of systems unremarkable.      Medications: I have reviewed the patient's current medications.  Current Outpatient Prescriptions  Medication Sig Dispense Refill  . aspirin (ASPIRIN EC) 81 MG EC tablet Take 81 mg by mouth daily. Swallow whole.    Marland Kitchen azelastine (ASTELIN) 0.1 % nasal spray Place 1-2 sprays into both nostrils daily as needed. allergies  5  . cetirizine (ZYRTEC) 10 MG tablet Take 10 mg by mouth daily as needed for allergies. As needed    . fluticasone (FLONASE) 50 MCG/ACT nasal spray Place 1 spray into both nostrils daily as needed. Allergies  5  . levothyroxine (SYNTHROID, LEVOTHROID) 125 MCG tablet Alternates with 112 mcg  9  . Multiple Vitamins-Minerals (MULTIVITAMIN PO) Take 1 capsule by mouth daily.    . predniSONE (DELTASONE) 5 MG tablet Take 1 tablet (5 mg total) by mouth daily with breakfast. 90 tablet 0  . ZYTIGA 250 MG tablet TAKE 4 TABLETS BY MOUTH DAILY. TAKE ON AN EMPTY STOMACH 1 HOUR BEFORE OR 2 HOURS AFTER A MEAL 120 tablet 0  . levothyroxine (SYNTHROID, LEVOTHROID) 112 MCG tablet TK 1 T PO QD IN THE MORNING OES  12   No current facility-administered medications for this visit.      Allergies:  Allergies  Allergen Reactions  . Amoxicillin Rash    Has patient had a PCN reaction causing immediate rash, facial/tongue/throat swelling, SOB or lightheadedness with hypotension: No Has patient had a PCN reaction causing severe rash involving mucus membranes or skin necrosis: No Has patient had a PCN reaction that required hospitalization No Has patient had a PCN  reaction occurring within the last 10 years: Yes If all of the above answers are "NO", then may proceed with Cephalosporin use.     Past Medical History, Surgical history, Social history, and Family History were reviewed and updated.   Physical Exam: Blood pressure 139/62, pulse 66, temperature 97.5 F (36.4 C), temperature source Oral, resp. rate  18, height 6' (1.829 m), weight 240 lb (108.9 kg), SpO2 100 %. ECOG: 0 General appearance: Well-appearing gentleman appeared comfortable without distress. Head: Normocephalic, without obvious abnormality no oral thrush or ulcers. Neck: no adenopathy no thyroid masses. Lymph nodes: Cervical, supraclavicular, and axillary nodes normal. Heart:regular rate and rhythm, S1, S2 normal, no murmur, click, rub or gallop Lung:chest clear, no wheezing, rales, normal symmetric air entry Abdomin: soft, non-tender, without masses or organomegaly. No rebound or guarding. EXT:no erythema, induration, or nodules.  Skin: No ecchymosis or erythema.   Lab Results: Lab Results  Component Value Date   WBC 7.2 07/03/2016   HGB 13.9 07/03/2016   HCT 41.7 07/03/2016   MCV 89.3 07/03/2016   PLT 263 07/03/2016     Chemistry      Component Value Date/Time   NA 140 07/03/2016 1113   K 3.7 07/03/2016 1113   CL 104 06/22/2008 1042   CO2 26 07/03/2016 1113   BUN 19.2 07/03/2016 1113   CREATININE 1.2 07/03/2016 1113      Component Value Date/Time   CALCIUM 8.8 07/03/2016 1113   ALKPHOS 94 07/03/2016 1113   AST 15 07/03/2016 1113   ALT 13 07/03/2016 1113   BILITOT 0.60 07/03/2016 1113      Results for VINEETH, DALMAU (MRN [id number]) as of 07/05/2016 10:15  Ref. Range 05/22/2016 10:20 07/03/2016 11:13  PSA Latest Ref Range: 0.0 - 4.0 ng/mL 12.3 (H) 6.7 (H)   EXAM: NUCLEAR MEDICINE WHOLE BODY BONE SCAN  TECHNIQUE: Whole body anterior and posterior images were obtained approximately 3 hours after intravenous injection of radiopharmaceutical.  RADIOPHARMACEUTICALS:  22 mCi Technetium-53m MDP IV  COMPARISON:  Bone scan of Dec 12, 2015 and January 29, 2014.  FINDINGS: Stable focus of uptake seen involving the sternum compared to prior exam. Uptake is also noted in the right sternoclavicular joint as well as both shoulders suggesting degenerative change. There is noted a focus of abnormal  uptake projected over the inferior portion of the left sacroiliac joint on the posterior projection this appears to be stable compared to prior exam, although it was not present on previous exams. No other area of abnormal uptake is noted.  IMPRESSION: Stable foci of abnormal uptake are noted in the bilateral shoulder and right sternoclavicular joints consistent with degenerative change. Stable focus of abnormal uptake in the sternum which most likely is degenerative.  There is noted a focus of abnormal uptake projected over the inferior portion of the left sacroiliac joint which is stable compared to prior exam, but was not present on prior exams. This may simply represent degenerative change, but metastatic focus cannot be excluded. MRI may be performed further evaluation.    Impression and Plan:  80 year old gentleman with the following issues:   1. Prostate cancer diagnosed in 1999 with a Gleason score 3+4 equals 7 and a PSA of around 4. He is status post prostatectomy and found to have stage T3b disease. He developed a biochemical relapse and treated with intermittent androgen deprivation his PSA was up to 17.17 with possible sacral metastasis.  He was treated initially with ketoconazole and prednisone and progressed in  October 2017. Bone scan obtained on 06/19/2016 did not show any widespread metastatic bony disease. He continues to have questionable area of uptake in the left sacroiliac joint.  He is currently on Zytiga started November 2017 and continue to tolerate it well. His PSA had an excellent response after one month of therapy by 50% decline to 6.7. The plan is to continue with the same dose and schedule for the time being.   2. Androgen depravation: He was advised to continue this under the care of [facility].  3. Liver function surveillance: His liver function test obtained on 07/03/2016 are within normal range.  4. Hyperthyroidism: He developed hypothyroidism at  this time after radioablation. Currently on thyroid supplements.  5. Followup: In 6 weeks to follow his clinical progress and tolerance to this new medication.      [facility], MD 12/14/201710:27 AM

## 2016-07-06 ENCOUNTER — Other Ambulatory Visit: Payer: Self-pay | Admitting: Ophthalmology

## 2016-07-06 DIAGNOSIS — H532 Diplopia: Secondary | ICD-10-CM | POA: Diagnosis not present

## 2016-07-06 DIAGNOSIS — H5712 Ocular pain, left eye: Secondary | ICD-10-CM | POA: Diagnosis not present

## 2016-07-14 ENCOUNTER — Ambulatory Visit
Admission: RE | Admit: 2016-07-14 | Discharge: 2016-07-14 | Disposition: A | Discharge status: Home / Self Care | Payer: Medicare Other | Source: Ambulatory Visit | Attending: Ophthalmology | Admitting: Ophthalmology

## 2016-07-14 DIAGNOSIS — H532 Diplopia: Secondary | ICD-10-CM

## 2016-07-14 MED ORDER — GADOBENATE DIMEGLUMINE 529 MG/ML IV SOLN
20.0000 mL | Freq: Once | INTRAVENOUS | Status: AC | PRN
  Administered 2016-07-14: 20 mL via INTRAVENOUS

## 2016-07-20 DIAGNOSIS — E0500 Thyrotoxicosis with diffuse goiter without thyrotoxic crisis or storm: Secondary | ICD-10-CM | POA: Diagnosis not present

## 2016-07-24 ENCOUNTER — Other Ambulatory Visit: Payer: Self-pay | Admitting: Oncology

## 2016-07-25 ENCOUNTER — Encounter: Payer: Self-pay | Admitting: *Deleted

## 2016-07-25 MED FILL — ZYTIGA 250 MG TABLET: 250 | 30 days supply | Qty: 120 | Fill #0

## 2016-08-10 DIAGNOSIS — L570 Actinic keratosis: Secondary | ICD-10-CM | POA: Diagnosis not present

## 2016-08-10 DIAGNOSIS — D1801 Hemangioma of skin and subcutaneous tissue: Secondary | ICD-10-CM | POA: Diagnosis not present

## 2016-08-10 DIAGNOSIS — D2272 Melanocytic nevi of left lower limb, including hip: Secondary | ICD-10-CM | POA: Diagnosis not present

## 2016-08-10 DIAGNOSIS — D3612 Benign neoplasm of peripheral nerves and autonomic nervous system, upper limb, including shoulder: Secondary | ICD-10-CM | POA: Diagnosis not present

## 2016-08-10 DIAGNOSIS — L821 Other seborrheic keratosis: Secondary | ICD-10-CM | POA: Diagnosis not present

## 2016-08-10 DIAGNOSIS — D225 Melanocytic nevi of trunk: Secondary | ICD-10-CM | POA: Diagnosis not present

## 2016-08-17 DIAGNOSIS — E039 Hypothyroidism, unspecified: Secondary | ICD-10-CM | POA: Diagnosis not present

## 2016-08-21 ENCOUNTER — Other Ambulatory Visit (HOSPITAL_BASED_OUTPATIENT_CLINIC_OR_DEPARTMENT_OTHER): Payer: Medicare Other

## 2016-08-21 DIAGNOSIS — C61 Malignant neoplasm of prostate: Secondary | ICD-10-CM | POA: Diagnosis not present

## 2016-08-21 LAB — CBC WITH DIFFERENTIAL/PLATELET
BASO%: 0.3 % (ref 0.0–2.0)
Basophils Absolute: 0 10*3/uL (ref 0.0–0.1)
EOS ABS: 0.6 10*3/uL — AB (ref 0.0–0.5)
EOS%: 7.5 % — ABNORMAL HIGH (ref 0.0–7.0)
HCT: 43.1 % (ref 38.4–49.9)
HGB: 14.4 g/dL (ref 13.0–17.1)
LYMPH%: 10.9 % — AB (ref 14.0–49.0)
MCH: 29.9 pg (ref 27.2–33.4)
MCHC: 33.4 g/dL (ref 32.0–36.0)
MCV: 89.6 fL (ref 79.3–98.0)
MONO#: 0.6 10*3/uL (ref 0.1–0.9)
MONO%: 7.1 % (ref 0.0–14.0)
NEUT#: 5.9 10*3/uL (ref 1.5–6.5)
NEUT%: 74.2 % (ref 39.0–75.0)
Platelets: 277 10*3/uL (ref 140–400)
RBC: 4.81 10*6/uL (ref 4.20–5.82)
RDW: 13.4 % (ref 11.0–14.6)
WBC: 8 10*3/uL (ref 4.0–10.3)
lymph#: 0.9 10*3/uL (ref 0.9–3.3)

## 2016-08-21 LAB — COMPREHENSIVE METABOLIC PANEL
ALT: 14 U/L (ref 0–55)
AST: 15 U/L (ref 5–34)
Albumin: 3.7 g/dL (ref 3.5–5.0)
Alkaline Phosphatase: 92 U/L (ref 40–150)
Anion Gap: 8 mEq/L (ref 3–11)
BILIRUBIN TOTAL: 0.62 mg/dL (ref 0.20–1.20)
BUN: 21 mg/dL (ref 7.0–26.0)
CO2: 26 meq/L (ref 22–29)
Calcium: 8.9 mg/dL (ref 8.4–10.4)
Chloride: 106 mEq/L (ref 98–109)
Creatinine: 1.4 mg/dL — ABNORMAL HIGH (ref 0.7–1.3)
EGFR: 49 mL/min/{1.73_m2} — AB (ref >90)
GLUCOSE: 111 mg/dL (ref 70–140)
Potassium: 3.5 mEq/L (ref 3.5–5.1)
SODIUM: 140 meq/L (ref 136–145)
Total Protein: 6.7 g/dL (ref 6.4–8.3)

## 2016-08-22 ENCOUNTER — Other Ambulatory Visit: Payer: Self-pay | Admitting: Oncology

## 2016-08-22 LAB — PSA: Prostate Specific Ag, Serum: 6.7 ng/mL — ABNORMAL HIGH (ref 0.0–4.0)

## 2016-08-23 ENCOUNTER — Ambulatory Visit (HOSPITAL_BASED_OUTPATIENT_CLINIC_OR_DEPARTMENT_OTHER): Payer: Medicare Other | Admitting: Oncology

## 2016-08-23 ENCOUNTER — Telehealth: Payer: Self-pay | Admitting: Oncology

## 2016-08-23 VITALS — BP 128/74 | HR 86 | Temp 100.0°F | Resp 18 | Ht 72.0 in | Wt 237.5 lb

## 2016-08-23 DIAGNOSIS — C61 Malignant neoplasm of prostate: Secondary | ICD-10-CM | POA: Diagnosis not present

## 2016-08-23 DIAGNOSIS — E039 Hypothyroidism, unspecified: Secondary | ICD-10-CM

## 2016-08-23 DIAGNOSIS — E291 Testicular hypofunction: Secondary | ICD-10-CM | POA: Diagnosis not present

## 2016-08-23 MED FILL — ZYTIGA 250 MG TABLET: 250 | 30 days supply | Qty: 120 | Fill #0

## 2016-08-23 NOTE — Telephone Encounter (Signed)
Appointments scheduled per 08/23/16 los. Patient was given a copy of the AVS report and appointment schedule, per 08/23/16 los. °

## 2016-08-23 NOTE — Progress Notes (Signed)
Hematology and Oncology Follow Up Visit  BAUTISTA SPIEGLER [id number] 03-05-1936 81 y.o. 08/23/2016 10:58 AM [name], MDGriffin, [name], MD   Principle Diagnosis: 81 year old gentleman with prostate cancer diagnosed in 1999 with a Gleason score 3+4 equals 7 and a PSA of around 4. He is currently experiencing a biochemical relapse without any measurable disease.   Prior Therapy:  He is status post prostatectomy and found to have stage T3b disease. He received salvage radiation therapy for a rise in his PSA.  He developed biochemical relapse and was treated with androgen depravation intermittently and most recently continuously with excellent response between 2010 to about 2015.  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 Ketoconazole at 200 mg twice a day and prednisone 5 mg daily. Therapy discontinued on 05/24/2016 because of progression of disease.  Current therapy: Zytiga 1000 mg daily with prednisone at 5 mg daily started in November 2017.  Interim History:  Mr. Longtin presents today for a followup visit. Since the last visit, he reports no major complications related to [facility].  He denied any nausea, abdominal pain or edema. He denied any fatigue or tiredness. His appetite remains excellent and I would not lost any weight. He continues to have fatigue related to thyroid disease. He remains on thyroid replacement regarding that. He does report some occasional vision problems related to his thyroid disease. No falls, syncope or seizures.   He does not report any headaches, blurred vision or syncope. He does not report any fevers, chills or sweats. He does not report any chest pain, shortness of breath, leg edema or palpitation. He does not report any wheezing, cough or hemoptysis. He does not report any skeletal complaints. He is not report any lymphadenopathy or petechiae. The rest of his review of systems  unremarkable.      Medications: I have reviewed the patient's current medications.  Current Outpatient Prescriptions  Medication Sig Dispense Refill  . aspirin (ASPIRIN EC) 81 MG EC tablet Take 81 mg by mouth daily. Swallow whole.    Marland Kitchen azelastine (ASTELIN) 0.1 % nasal spray Place 1-2 sprays into both nostrils daily as needed. allergies  5  . cetirizine (ZYRTEC) 10 MG tablet Take 10 mg by mouth daily as needed for allergies. As needed    . fluticasone (FLONASE) 50 MCG/ACT nasal spray Place 1 spray into both nostrils daily as needed. Allergies  5  . levothyroxine (SYNTHROID, LEVOTHROID) 112 MCG tablet TK 1 T PO QD IN THE MORNING OES  12  . levothyroxine (SYNTHROID, LEVOTHROID) 125 MCG tablet Alternates with 112 mcg  9  . Multiple Vitamins-Minerals (MULTIVITAMIN PO) Take 1 capsule by mouth daily.    . predniSONE (DELTASONE) 5 MG tablet Take 1 tablet (5 mg total) by mouth daily with breakfast. 90 tablet 0  . ZYTIGA 250 MG tablet TAKE 4 TABLETS BY MOUTH DAILY. TAKE ON AN EMPTY STOMACH 1 HOUR BEFORE OR 2 HOURS AFTER A MEAL 120 tablet 0   No current facility-administered medications for this visit.      Allergies:  Allergies  Allergen Reactions  . Amoxicillin Rash    Has patient had a PCN reaction causing immediate rash, facial/tongue/throat swelling, SOB or lightheadedness with hypotension: No Has patient had a PCN reaction causing severe rash involving mucus membranes or skin necrosis: No Has patient had a PCN reaction that required hospitalization No Has patient had a PCN reaction occurring within the last 10 years: Yes If all  of the above answers are "NO", then may proceed with Cephalosporin use.     Past Medical History, Surgical history, Social history, and Family History were reviewed and updated.   Physical Exam: Blood pressure 128/74, pulse 86, temperature 100 F (37.8 C), temperature source Oral, resp. rate 18, height 6' (1.829 m), weight 237 lb 8 oz (107.7 kg), SpO2 100  %. ECOG: 0 General appearance: Alert, awake gentleman without distress. Head: Normocephalic, without obvious abnormality no oral ulcers or lesions. Neck: no adenopathy no thyroid masses. Lymph nodes: Cervical, supraclavicular, and axillary nodes normal. Heart:regular rate and rhythm, S1, S2 normal, no murmur, click, rub or gallop Lung:chest clear, no wheezing, rales, normal symmetric air entry Abdomin: soft, non-tender, without masses or organomegaly. No shifting dullness or ascites. EXT:no erythema, induration, or nodules.  Skin: No ecchymosis or erythema.   Lab Results: Lab Results  Component Value Date   WBC 8.0 08/21/2016   HGB 14.4 08/21/2016   HCT 43.1 08/21/2016   MCV 89.6 08/21/2016   PLT 277 08/21/2016     Chemistry      Component Value Date/Time   NA 140 08/21/2016 1106   K 3.5 08/21/2016 1106   CL 104 06/22/2008 1042   CO2 26 08/21/2016 1106   BUN 21.0 08/21/2016 1106   CREATININE 1.4 (H) 08/21/2016 1106      Component Value Date/Time   CALCIUM 8.9 08/21/2016 1106   ALKPHOS 92 08/21/2016 1106   AST 15 08/21/2016 1106   ALT 14 08/21/2016 1106   BILITOT 0.62 08/21/2016 1106       Impression and Plan:  81 year old gentleman with the following issues:   1. Prostate cancer diagnosed in 1999 with a Gleason score 3+4 equals 7 and a PSA of around 4. He is status post prostatectomy and found to have stage T3b disease. He developed a biochemical relapse and treated with intermittent androgen deprivation his PSA was up to 17.17 with possible sacral metastasis.  He was treated initially with ketoconazole and prednisone and progressed in October 2017. Bone scan obtained on 06/19/2016 did not show any widespread metastatic bony disease. He continues to have questionable area of uptake in the left sacroiliac joint.  He is currently on Zytiga started November 2017 and continue to tolerate it well. His PSA remains stable at this time without any dramatic changes. I've  asked him to continue the same dose and schedule I will continue to monitor his PSA closely.   2. Androgen depravation: He was advised to continue this under the care of [facility].  3. Liver function surveillance: His liver function test obtained on 08/21/2016 remained within normal range.  4. Hyperthyroidism: He developed hypothyroidism at this time after radioablation. Currently on thyroid supplements.  5. Followup: In 7 weeks to follow his clinical progress and tolerance to this new medication.      [id number], MD 2/1/201810:58 AM

## 2016-08-29 ENCOUNTER — Other Ambulatory Visit: Payer: Self-pay | Admitting: Oncology

## 2016-08-29 MED FILL — predniSONE 5 MG TABS: 5 | 90 days supply | Qty: 90 | Fill #0

## 2016-09-07 DIAGNOSIS — E0500 Thyrotoxicosis with diffuse goiter without thyrotoxic crisis or storm: Secondary | ICD-10-CM | POA: Diagnosis not present

## 2016-09-24 ENCOUNTER — Other Ambulatory Visit: Payer: Self-pay | Admitting: Oncology

## 2016-09-24 MED FILL — ZYTIGA 250 MG TABLET: 250 | 30 days supply | Qty: 120 | Fill #0

## 2016-09-28 DIAGNOSIS — Z192 Hormone resistant malignancy status: Secondary | ICD-10-CM | POA: Diagnosis not present

## 2016-09-28 DIAGNOSIS — C61 Malignant neoplasm of prostate: Secondary | ICD-10-CM | POA: Diagnosis not present

## 2016-09-28 DIAGNOSIS — R9721 Rising PSA following treatment for malignant neoplasm of prostate: Secondary | ICD-10-CM | POA: Diagnosis not present

## 2016-10-09 DIAGNOSIS — Z5111 Encounter for antineoplastic chemotherapy: Secondary | ICD-10-CM | POA: Diagnosis not present

## 2016-10-09 DIAGNOSIS — C61 Malignant neoplasm of prostate: Secondary | ICD-10-CM | POA: Diagnosis not present

## 2016-10-12 DIAGNOSIS — E038 Other specified hypothyroidism: Secondary | ICD-10-CM | POA: Diagnosis not present

## 2016-10-15 ENCOUNTER — Other Ambulatory Visit: Payer: Self-pay | Admitting: Internal Medicine

## 2016-10-15 ENCOUNTER — Ambulatory Visit
Admission: RE | Admit: 2016-10-15 | Discharge: 2016-10-15 | Disposition: A | Discharge status: Home / Self Care | Payer: Medicare Other | Source: Ambulatory Visit | Attending: Internal Medicine | Admitting: Internal Medicine

## 2016-10-15 DIAGNOSIS — R0600 Dyspnea, unspecified: Secondary | ICD-10-CM

## 2016-10-15 DIAGNOSIS — R0609 Other forms of dyspnea: Principal | ICD-10-CM

## 2016-10-15 DIAGNOSIS — R0602 Shortness of breath: Secondary | ICD-10-CM | POA: Diagnosis not present

## 2016-10-15 DIAGNOSIS — Z0000 Encounter for general adult medical examination without abnormal findings: Secondary | ICD-10-CM | POA: Diagnosis not present

## 2016-10-15 DIAGNOSIS — R5383 Other fatigue: Secondary | ICD-10-CM | POA: Diagnosis not present

## 2016-10-15 DIAGNOSIS — E039 Hypothyroidism, unspecified: Secondary | ICD-10-CM | POA: Diagnosis not present

## 2016-10-15 DIAGNOSIS — C61 Malignant neoplasm of prostate: Secondary | ICD-10-CM | POA: Diagnosis not present

## 2016-10-16 ENCOUNTER — Other Ambulatory Visit (HOSPITAL_BASED_OUTPATIENT_CLINIC_OR_DEPARTMENT_OTHER): Payer: Medicare Other

## 2016-10-16 DIAGNOSIS — E7800 Pure hypercholesterolemia, unspecified: Secondary | ICD-10-CM | POA: Diagnosis not present

## 2016-10-16 DIAGNOSIS — C61 Malignant neoplasm of prostate: Secondary | ICD-10-CM

## 2016-10-16 DIAGNOSIS — R5383 Other fatigue: Secondary | ICD-10-CM | POA: Diagnosis not present

## 2016-10-16 LAB — COMPREHENSIVE METABOLIC PANEL
ALT: 13 U/L (ref 0–55)
AST: 16 U/L (ref 5–34)
Albumin: 3.9 g/dL (ref 3.5–5.0)
Alkaline Phosphatase: 96 U/L (ref 40–150)
Anion Gap: 10 mEq/L (ref 3–11)
BUN: 19.1 mg/dL (ref 7.0–26.0)
CHLORIDE: 108 meq/L (ref 98–109)
CO2: 25 meq/L (ref 22–29)
CREATININE: 1.3 mg/dL (ref 0.7–1.3)
Calcium: 9.2 mg/dL (ref 8.4–10.4)
EGFR: 53 mL/min/{1.73_m2} — ABNORMAL LOW (ref >90)
GLUCOSE: 144 mg/dL — AB (ref 70–140)
Potassium: 3.5 mEq/L (ref 3.5–5.1)
Sodium: 143 mEq/L (ref 136–145)
Total Bilirubin: 0.85 mg/dL (ref 0.20–1.20)
Total Protein: 6.9 g/dL (ref 6.4–8.3)

## 2016-10-16 LAB — CBC WITH DIFFERENTIAL/PLATELET
BASO%: 1.1 % (ref 0.0–2.0)
Basophils Absolute: 0.1 10*3/uL (ref 0.0–0.1)
EOS%: 9.7 % — AB (ref 0.0–7.0)
Eosinophils Absolute: 0.8 10*3/uL — ABNORMAL HIGH (ref 0.0–0.5)
HEMATOCRIT: 42.9 % (ref 38.4–49.9)
HGB: 14.4 g/dL (ref 13.0–17.1)
LYMPH#: 1.2 10*3/uL (ref 0.9–3.3)
LYMPH%: 14.2 % (ref 14.0–49.0)
MCH: 30.1 pg (ref 27.2–33.4)
MCHC: 33.6 g/dL (ref 32.0–36.0)
MCV: 89.8 fL (ref 79.3–98.0)
MONO#: 0.7 10*3/uL (ref 0.1–0.9)
MONO%: 8.8 % (ref 0.0–14.0)
NEUT#: 5.4 10*3/uL (ref 1.5–6.5)
NEUT%: 66.2 % (ref 39.0–75.0)
Platelets: 295 10*3/uL (ref 140–400)
RBC: 4.77 10*6/uL (ref 4.20–5.82)
RDW: 13.9 % (ref 11.0–14.6)
WBC: 8.2 10*3/uL (ref 4.0–10.3)

## 2016-10-17 LAB — PSA: PROSTATE SPECIFIC AG, SERUM: 7.8 ng/mL — AB (ref 0.0–4.0)

## 2016-10-18 ENCOUNTER — Ambulatory Visit (HOSPITAL_BASED_OUTPATIENT_CLINIC_OR_DEPARTMENT_OTHER): Payer: Medicare Other | Admitting: Oncology

## 2016-10-18 ENCOUNTER — Telehealth: Payer: Self-pay | Admitting: Oncology

## 2016-10-18 VITALS — BP 153/78 | HR 80 | Temp 97.7°F | Resp 18 | Wt 237.3 lb

## 2016-10-18 DIAGNOSIS — C61 Malignant neoplasm of prostate: Secondary | ICD-10-CM | POA: Diagnosis not present

## 2016-10-18 DIAGNOSIS — E039 Hypothyroidism, unspecified: Secondary | ICD-10-CM

## 2016-10-18 DIAGNOSIS — E0500 Thyrotoxicosis with diffuse goiter without thyrotoxic crisis or storm: Secondary | ICD-10-CM | POA: Diagnosis not present

## 2016-10-18 DIAGNOSIS — E291 Testicular hypofunction: Secondary | ICD-10-CM | POA: Diagnosis not present

## 2016-10-18 NOTE — Telephone Encounter (Signed)
Appointments scheduled per 3.29.18 LOS. Patient given AVS report and calendars with future scheduled appointments. °

## 2016-10-18 NOTE — Progress Notes (Signed)
Hematology and Oncology Follow Up Visit  Edward Bailey [id number] [birth year] 81 y.o. 10/18/2016 3:16 PM Edward Bailey, MDGriffin, [name], MD   Principle Diagnosis: 81 year old gentleman with prostate cancer diagnosed in 1999 with a Gleason score 3+4 equals 7 and a PSA of around 4. He is currently experiencing a biochemical relapse without any measurable disease.   Prior Therapy:  He is status post prostatectomy and found to have stage T3b disease. He received salvage radiation therapy for a rise in his PSA.  He developed biochemical relapse and was treated with androgen depravation intermittently and most recently continuously with excellent response between 2010 to about 2015.  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 Ketoconazole at 200 mg twice a day and prednisone 5 mg daily. Therapy discontinued on 05/24/2016 because of progression of disease.  Current therapy: Zytiga 1000 mg daily with prednisone at 5 mg daily started in November 2017.  Interim History:  Edward Bailey presents today for a followup visit. Since the last visit, he reports no changes in his health. He continues to be active and attends to activities of daily living. He does have mild fatigue and also diagnosed with sleep apnea. He reports no major complications related to Zytiga.  He denied any nausea, abdominal pain or edema.  His appetite remains excellent and his weight is stable. He remains on thyroid replacement regarding that.    He does not report any headaches, blurred vision or syncope. He does not report any fevers, chills or sweats. He does not report any chest pain, shortness of breath, leg edema or palpitation. He does not report any wheezing, cough or hemoptysis. He does not report any skeletal complaints. He is not report any lymphadenopathy or petechiae. The rest of his review of systems unremarkable.      Medications: I have  reviewed the patient's current medications.  Current Outpatient Prescriptions  Medication Sig Dispense Refill  . aspirin (ASPIRIN EC) 81 MG EC tablet Take 81 mg by mouth daily. Swallow whole.    Marland Kitchen azelastine (ASTELIN) 0.1 % nasal spray Place 1-2 sprays into both nostrils daily as needed. allergies  5  . cetirizine (ZYRTEC) 10 MG tablet Take 10 mg by mouth daily as needed for allergies. As needed    . fluticasone (FLONASE) 50 MCG/ACT nasal spray Place 1 spray into both nostrils daily as needed. Allergies  5  . levothyroxine (SYNTHROID, LEVOTHROID) 112 MCG tablet TK 1 T PO QD IN THE MORNING OES  12  . levothyroxine (SYNTHROID, LEVOTHROID) 125 MCG tablet Alternates with 112 mcg  9  . Multiple Vitamins-Minerals (MULTIVITAMIN PO) Take 1 capsule by mouth daily.    . predniSONE (DELTASONE) 5 MG tablet TAKE 1 TABLET (5 MG TOTAL) BY MOUTH DAILY WITH BREAKFAST. 90 tablet 0  . ZYTIGA 250 MG tablet TAKE 4 TABLETS BY MOUTH DAILY. TAKE ON AN EMPTY STOMACH 1 HOUR BEFORE OR 2 HOURS AFTER A MEAL 120 tablet 0   No current facility-administered medications for this visit.      Allergies:  Allergies  Allergen Reactions  . Amoxicillin Rash    Has patient had a PCN reaction causing immediate rash, facial/tongue/throat swelling, SOB or lightheadedness with hypotension: No Has patient had a PCN reaction causing severe rash involving mucus membranes or skin necrosis: No Has patient had a PCN reaction that required hospitalization No Has patient had a PCN reaction occurring within the last 10 years: Yes If all of the  above answers are "NO", then may proceed with Cephalosporin use.     Past Medical History, Surgical history, Social history, and Family History were reviewed and updated.   Physical Exam: Blood pressure (!) 153/78, pulse 80, temperature 97.7 F (36.5 C), temperature source Oral, resp. rate 18, weight 237 lb 4.8 oz (107.6 kg), SpO2 95 %. ECOG: 0 General appearance: Well-appearing gentleman  appeared without distress. Head: Normocephalic, without obvious abnormality no oral ulcers or thrush. Neck: no adenopathy no thyroid masses. Lymph nodes: Cervical, supraclavicular, and axillary nodes normal. Heart:regular rate and rhythm, S1, S2 normal, no murmur, click, rub or gallop Lung:chest clear, no wheezing, rales, normal symmetric air entry Abdomin: soft, non-tender, without masses or organomegaly. No rebound or guarding. EXT:no erythema, induration, or nodules.  Skin: No ecchymosis or erythema.   Lab Results: Lab Results  Component Value Date   WBC 8.2 10/16/2016   HGB 14.4 10/16/2016   HCT 42.9 10/16/2016   MCV 89.8 10/16/2016   PLT 295 10/16/2016     Chemistry      Component Value Date/Time   NA 143 10/16/2016 0935   K 3.5 10/16/2016 0935   CL 104 06/22/2008 1042   CO2 25 10/16/2016 0935   BUN 19.1 10/16/2016 0935   CREATININE 1.3 10/16/2016 0935      Component Value Date/Time   CALCIUM 9.2 10/16/2016 0935   ALKPHOS 96 10/16/2016 0935   AST 16 10/16/2016 0935   ALT 13 10/16/2016 0935   BILITOT 0.85 10/16/2016 0935     Results for Edward Bailey, Edward Bailey (MRN [id number]) as of 10/18/2016 15:17  Ref. Range 08/21/2016 11:06 10/16/2016 09:35  PSA Latest Ref Range: 0.0 - 4.0 ng/mL 6.7 (H) 7.8 (H)    Impression and Plan:  81 year old gentleman with the following issues:   1. Prostate cancer diagnosed in 1999 with a Gleason score 3+4 equals 7 and a PSA of around 4. He is status post prostatectomy and found to have stage T3b disease. He developed a biochemical relapse and treated with intermittent androgen deprivation his PSA was up to 17.17 with possible sacral metastasis.  He was treated initially with ketoconazole and prednisone and progressed in October 2017. Bone scan obtained on 06/19/2016 did not show any widespread metastatic bony disease. He continues to have questionable area of uptake in the left sacroiliac joint.  He is currently on Zytiga started November  2017 and continue to tolerate it well. His PSA remains relatively stable without any major complications. Risks and benefits of continuing this medication was reviewed today and he is agreeable to continue. Different salvage therapy will be used if his PSA continues to rise rapidly. At that time restaging workup will be done including CT scan and a bone scan.   2. Androgen depravation: He was advised to continue this under the care of [place].  3. Liver function surveillance: His liver function test obtained on 10/16/2016 was within normal range.  4. Hyperthyroidism: He developed hypothyroidism at this time after radioablation. Currently on thyroid supplements.  5. Fatigue: Appears to be multifactorial in nature related to his thyroid disease as well as possibly sleep apnea.  6. Followup: In May 2018.      Zola Button, MD 3/29/20183:16 PM

## 2016-10-24 ENCOUNTER — Other Ambulatory Visit: Payer: Self-pay | Admitting: Oncology

## 2016-10-24 MED FILL — ZYTIGA 250 MG TABLET: 250 | 30 days supply | Qty: 120 | Fill #0

## 2016-10-26 DIAGNOSIS — R5383 Other fatigue: Secondary | ICD-10-CM | POA: Diagnosis not present

## 2016-10-26 DIAGNOSIS — E2740 Unspecified adrenocortical insufficiency: Secondary | ICD-10-CM | POA: Diagnosis not present

## 2016-10-30 ENCOUNTER — Telehealth (INDEPENDENT_AMBULATORY_CARE_PROVIDER_SITE_OTHER): Payer: Medicare Other | Admitting: Interventional Cardiology

## 2016-10-30 ENCOUNTER — Other Ambulatory Visit: Payer: Self-pay

## 2016-10-30 ENCOUNTER — Ambulatory Visit (HOSPITAL_COMMUNITY): Payer: Medicare Other | Attending: Internal Medicine

## 2016-10-30 DIAGNOSIS — I501 Left ventricular failure: Secondary | ICD-10-CM | POA: Insufficient documentation

## 2016-10-30 DIAGNOSIS — I350 Nonrheumatic aortic (valve) stenosis: Secondary | ICD-10-CM | POA: Diagnosis not present

## 2016-10-30 DIAGNOSIS — R0600 Dyspnea, unspecified: Secondary | ICD-10-CM

## 2016-10-30 DIAGNOSIS — I340 Nonrheumatic mitral (valve) insufficiency: Secondary | ICD-10-CM | POA: Insufficient documentation

## 2016-10-30 DIAGNOSIS — I493 Ventricular premature depolarization: Secondary | ICD-10-CM | POA: Diagnosis not present

## 2016-10-30 DIAGNOSIS — R0609 Other forms of dyspnea: Secondary | ICD-10-CM

## 2016-10-30 DIAGNOSIS — I420 Dilated cardiomyopathy: Secondary | ICD-10-CM | POA: Diagnosis not present

## 2016-10-30 NOTE — Telephone Encounter (Signed)
The ECG demonstrated sinus rhythm with premature ventricular and atrial contractions. No evidence of atrial fibrillation. A copy of the electrocardiogram will be forwarded to the primary physician, Dr. [name].

## 2016-10-30 NOTE — Telephone Encounter (Signed)
Pt in today for echo.  Will from echo spoke with Dr. Tamala Julian about possibility of Afib.  Dr. Tamala Julian requested pt have EKG.  EKG completed and given to Dr. Tamala Julian.  Will route to Dr. Tamala Julian to note on interpretation.

## 2016-11-13 DIAGNOSIS — G4733 Obstructive sleep apnea (adult) (pediatric): Secondary | ICD-10-CM | POA: Diagnosis not present

## 2016-11-15 DIAGNOSIS — G4733 Obstructive sleep apnea (adult) (pediatric): Secondary | ICD-10-CM | POA: Diagnosis not present

## 2016-11-27 ENCOUNTER — Other Ambulatory Visit: Payer: Self-pay | Admitting: Oncology

## 2016-11-27 MED FILL — ZYTIGA 250 MG TABLET: 250 | 30 days supply | Qty: 120 | Fill #0

## 2016-11-28 DIAGNOSIS — G4733 Obstructive sleep apnea (adult) (pediatric): Secondary | ICD-10-CM | POA: Diagnosis not present

## 2016-12-13 ENCOUNTER — Other Ambulatory Visit: Payer: Self-pay | Admitting: Oncology

## 2016-12-13 MED FILL — predniSONE 5 MG TABS: 5 | 90 days supply | Qty: 90 | Fill #0

## 2016-12-18 ENCOUNTER — Other Ambulatory Visit (HOSPITAL_BASED_OUTPATIENT_CLINIC_OR_DEPARTMENT_OTHER): Payer: Medicare Other

## 2016-12-18 DIAGNOSIS — E291 Testicular hypofunction: Secondary | ICD-10-CM

## 2016-12-18 DIAGNOSIS — C61 Malignant neoplasm of prostate: Secondary | ICD-10-CM | POA: Diagnosis not present

## 2016-12-18 LAB — CBC WITH DIFFERENTIAL/PLATELET
BASO%: 1.1 % (ref 0.0–2.0)
BASOS ABS: 0.1 10*3/uL (ref 0.0–0.1)
EOS%: 4.8 % (ref 0.0–7.0)
Eosinophils Absolute: 0.5 10*3/uL (ref 0.0–0.5)
HCT: 43.5 % (ref 38.4–49.9)
HGB: 14.9 g/dL (ref 13.0–17.1)
LYMPH%: 7.4 % — AB (ref 14.0–49.0)
MCH: 31 pg (ref 27.2–33.4)
MCHC: 34.4 g/dL (ref 32.0–36.0)
MCV: 90.1 fL (ref 79.3–98.0)
MONO#: 0.7 10*3/uL (ref 0.1–0.9)
MONO%: 6.5 % (ref 0.0–14.0)
NEUT#: 8.3 10*3/uL — ABNORMAL HIGH (ref 1.5–6.5)
NEUT%: 80.2 % — AB (ref 39.0–75.0)
PLATELETS: 306 10*3/uL (ref 140–400)
RBC: 4.82 10*6/uL (ref 4.20–5.82)
RDW: 13.4 % (ref 11.0–14.6)
WBC: 10.4 10*3/uL — ABNORMAL HIGH (ref 4.0–10.3)
lymph#: 0.8 10*3/uL — ABNORMAL LOW (ref 0.9–3.3)

## 2016-12-18 LAB — COMPREHENSIVE METABOLIC PANEL
ALT: 12 U/L (ref 0–55)
ANION GAP: 9 meq/L (ref 3–11)
AST: 14 U/L (ref 5–34)
Albumin: 3.9 g/dL (ref 3.5–5.0)
Alkaline Phosphatase: 92 U/L (ref 40–150)
BUN: 15 mg/dL (ref 7.0–26.0)
CALCIUM: 9.2 mg/dL (ref 8.4–10.4)
CHLORIDE: 109 meq/L (ref 98–109)
CO2: 25 meq/L (ref 22–29)
Creatinine: 1.2 mg/dL (ref 0.7–1.3)
EGFR: 60 mL/min/{1.73_m2} — AB (ref >90)
Glucose: 67 mg/dl — ABNORMAL LOW (ref 70–140)
POTASSIUM: 3.3 meq/L — AB (ref 3.5–5.1)
Sodium: 144 mEq/L (ref 136–145)
Total Bilirubin: 0.51 mg/dL (ref 0.20–1.20)
Total Protein: 6.8 g/dL (ref 6.4–8.3)

## 2016-12-19 DIAGNOSIS — G4731 Primary central sleep apnea: Secondary | ICD-10-CM | POA: Diagnosis not present

## 2016-12-19 LAB — PSA: PROSTATE SPECIFIC AG, SERUM: 10.3 ng/mL — AB (ref 0.0–4.0)

## 2016-12-20 ENCOUNTER — Ambulatory Visit: Payer: Medicare Other | Admitting: Oncology

## 2016-12-20 ENCOUNTER — Ambulatory Visit (HOSPITAL_BASED_OUTPATIENT_CLINIC_OR_DEPARTMENT_OTHER): Payer: Medicare Other | Admitting: Oncology

## 2016-12-20 ENCOUNTER — Telehealth: Payer: Self-pay | Admitting: Oncology

## 2016-12-20 VITALS — BP 146/87 | HR 68 | Temp 97.6°F | Resp 17 | Ht 72.0 in | Wt 234.5 lb

## 2016-12-20 DIAGNOSIS — E0789 Other specified disorders of thyroid: Secondary | ICD-10-CM

## 2016-12-20 DIAGNOSIS — C61 Malignant neoplasm of prostate: Secondary | ICD-10-CM | POA: Diagnosis not present

## 2016-12-20 DIAGNOSIS — E291 Testicular hypofunction: Secondary | ICD-10-CM | POA: Diagnosis not present

## 2016-12-20 NOTE — Progress Notes (Signed)
Hematology and Oncology Follow Up Visit  JISHNU JENNIGES [id number] 06-06-1936 81 y.o. 12/20/2016 4:13 PM [name], MDGriffin, [name], MD   Principle Diagnosis: 81 year old gentleman with prostate cancer diagnosed in 1999 with a Gleason score 3+4 equals 7 and a PSA of around 4. He is currently experiencing a biochemical relapse without any measurable disease.   Prior Therapy:  He is status post prostatectomy and found to have stage T3b disease. He received salvage radiation therapy for a rise in his PSA.  He developed biochemical relapse and was treated with androgen depravation intermittently and most recently continuously with excellent response between 2010 to about 2015.  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 Ketoconazole at 200 mg twice a day and prednisone 5 mg daily. Therapy discontinued on 05/24/2016 because of progression of disease.  Current therapy: Zytiga 1000 mg daily with prednisone at 5 mg daily started in November 2017.  Interim History:  Mr. Hunley presents today for a followup visit. Since the last visit, he reports few complaints. He is experiencing occasional dyspnea on exertion and has been evaluated for sleep apnea. He completed a sleeping study did confirm that diagnosis. He also reported some periodic fatigue which is not dramatically changed from previously.   He continues to take Zytiga without any new complications. It is unclear whether his dyspnea and fatigue is related to Zytiga. He denied any edema or bone pain. His performance status and activity level remains about the same.   He does not report any headaches, blurred vision or syncope. He does not report any fevers, chills or sweats. He does not report any chest pain, shortness of breath, leg edema or palpitation. He does not report any wheezing, cough or hemoptysis. He does not report any skeletal complaints. He is not report any  lymphadenopathy or petechiae. The rest of his review of systems unremarkable.      Medications: I have reviewed the patient's current medications.  Current Outpatient Prescriptions  Medication Sig Dispense Refill  . aspirin (ASPIRIN EC) 81 MG EC tablet Take 81 mg by mouth daily. Swallow whole.    Marland Kitchen azelastine (ASTELIN) 0.1 % nasal spray Place 1-2 sprays into both nostrils daily as needed. allergies  5  . cetirizine (ZYRTEC) 10 MG tablet Take 10 mg by mouth daily as needed for allergies. As needed    . fluticasone (FLONASE) 50 MCG/ACT nasal spray Place 1 spray into both nostrils daily as needed. Allergies  5  . levothyroxine (SYNTHROID, LEVOTHROID) 112 MCG tablet TK 1 T PO QD IN THE MORNING OES  12  . levothyroxine (SYNTHROID, LEVOTHROID) 125 MCG tablet Alternates with 112 mcg  9  . Multiple Vitamins-Minerals (MULTIVITAMIN PO) Take 1 capsule by mouth daily.    . predniSONE (DELTASONE) 5 MG tablet TAKE 1 TABLET BY MOUTH ONCE DAILY WITH BREAKFAST 90 tablet 0  . ZYTIGA 250 MG tablet TAKE 4 TABLETS BY MOUTH DAILY. TAKE ON AN EMPTY STOMACH 1 HOUR BEFORE OR 2 HOURS AFTER A MEAL 120 tablet 0   No current facility-administered medications for this visit.      Allergies:  Allergies  Allergen Reactions  . Amoxicillin Rash    Has patient had a PCN reaction causing immediate rash, facial/tongue/throat swelling, SOB or lightheadedness with hypotension: No Has patient had a PCN reaction causing severe rash involving mucus membranes or skin necrosis: No Has patient had a PCN reaction that required hospitalization No Has patient had a  PCN reaction occurring within the last 10 years: Yes If all of the above answers are "NO", then may proceed with Cephalosporin use.     Past Medical History, Surgical history, Social history, and Family History were reviewed and updated.   Physical Exam: Blood pressure (!) 146/87, pulse 68, temperature 97.6 F (36.4 C), temperature source Oral, resp. rate 17,  height 6' (1.829 m), weight 234 lb 8 oz (106.4 kg), SpO2 97 %. ECOG: 0 General appearance: Alert, awake gentleman appeared without distress. Head: Normocephalic, without obvious abnormality no oral ulcers or thrush. Neck: no adenopathy or neck masses. Lymph nodes: Cervical, supraclavicular, and axillary nodes normal. Heart:regular rate and rhythm, S1, S2 normal, no murmur, click, rub or gallop Lung:chest clear, no wheezing, rales, normal symmetric air entry. Expiratory wheezes noted. Abdomin: soft, non-tender, without masses or organomegaly. No shifting dullness or ascites.. EXT:no erythema, induration, or nodules.  Skin: No ecchymosis or erythema.   Lab Results: Lab Results  Component Value Date   WBC 10.4 (H) 12/18/2016   HGB 14.9 12/18/2016   HCT 43.5 12/18/2016   MCV 90.1 12/18/2016   PLT 306 12/18/2016     Chemistry      Component Value Date/Time   NA 144 12/18/2016 1143   K 3.3 (L) 12/18/2016 1143   CL 104 06/22/2008 1042   CO2 25 12/18/2016 1143   BUN 15.0 12/18/2016 1143   CREATININE 1.2 12/18/2016 1143      Component Value Date/Time   CALCIUM 9.2 12/18/2016 1143   ALKPHOS 92 12/18/2016 1143   AST 14 12/18/2016 1143   ALT 12 12/18/2016 1143   BILITOT 0.51 12/18/2016 1143     Results for GABRIELE, LOVELAND (MRN [id number]) as of 12/20/2016 15:26  Ref. Range 08/21/2016 11:06 10/16/2016 09:35 12/18/2016 11:43  PSA Latest Ref Range: 0.0 - 4.0 ng/mL 6.7 (H) 7.8 (H) 10.3 (H)     Impression and Plan:  81 year old gentleman with the following issues:   1. Prostate cancer diagnosed in 1999 with a Gleason score 3+4 equals 7 and a PSA of around 4. He is status post prostatectomy and found to have stage T3b disease. He developed a biochemical relapse and treated with intermittent androgen deprivation his PSA was up to 17.17 with possible sacral metastasis.  He was treated initially with ketoconazole and prednisone and progressed in October 2017. Bone scan obtained on  06/19/2016 did not show any widespread metastatic bony disease. He continues to have questionable area of uptake in the left sacroiliac joint.  He is currently on Zytiga started November 2017 With an initial response to PSA with a drop to 6.7. His most recent PSA was up to 10.3 in May 2018. Risks and benefits of continuing Zytiga was discussed today. Options at this point will be to discontinue Zytiga given the potential side effects he is experiencing versus continuing Zytiga and restage him in 6 weeks.  Given the small wise in his PSA, and the likelihood his symptoms are not related to [facility] he opts to continue for the time being. The plan is to repeat his PSA in 6 weeks and obtain a bone scan at that time. He developed progression of disease, different salvage therapy can be used. That will be in the form of different hormone agents versus chemotherapy.   2. Androgen depravation: This will be continued indefinitely under the care of [place].  3. Liver function surveillance: His liver function test obtained on 12/18/2016 were within normal range.  4. Hyperthyroidism: He developed  hypothyroidism at this time after radioablation. Currently on thyroid supplements.  5. Fatigue: Appears to be multifactorial in nature related to his thyroid disease as well as possibly sleep apnea.  6. Dyspnea: Could be related to his sleep apnea versus side effects of Zytiga. We'll continue to monitor for the time being.  7. Followup: In July 2018.      Zola Button, MD 5/31/20184:13 PM

## 2016-12-20 NOTE — Telephone Encounter (Signed)
Appointments scheduled per 12/20/16 los. Patient was given a copy of the AVS report and appointment schedule, per 12/20/16 los.

## 2016-12-28 ENCOUNTER — Other Ambulatory Visit: Payer: Self-pay | Admitting: Oncology

## 2016-12-28 MED FILL — ZYTIGA 250 MG TABLET: 250 | 30 days supply | Qty: 120 | Fill #0

## 2016-12-31 DIAGNOSIS — G4733 Obstructive sleep apnea (adult) (pediatric): Secondary | ICD-10-CM | POA: Diagnosis not present

## 2017-01-10 DIAGNOSIS — H5203 Hypermetropia, bilateral: Secondary | ICD-10-CM | POA: Diagnosis not present

## 2017-01-18 DIAGNOSIS — L720 Epidermal cyst: Secondary | ICD-10-CM | POA: Diagnosis not present

## 2017-01-21 ENCOUNTER — Other Ambulatory Visit: Payer: Self-pay | Admitting: Oncology

## 2017-01-21 MED FILL — ZYTIGA 250 MG TABLET: 250 | 30 days supply | Qty: 120 | Fill #0

## 2017-01-29 DIAGNOSIS — S61401A Unspecified open wound of right hand, initial encounter: Secondary | ICD-10-CM | POA: Diagnosis not present

## 2017-01-29 DIAGNOSIS — S81801A Unspecified open wound, right lower leg, initial encounter: Secondary | ICD-10-CM | POA: Diagnosis not present

## 2017-01-29 DIAGNOSIS — S41101A Unspecified open wound of right upper arm, initial encounter: Secondary | ICD-10-CM | POA: Diagnosis not present

## 2017-01-30 DIAGNOSIS — G4733 Obstructive sleep apnea (adult) (pediatric): Secondary | ICD-10-CM | POA: Diagnosis not present

## 2017-02-04 ENCOUNTER — Other Ambulatory Visit (HOSPITAL_BASED_OUTPATIENT_CLINIC_OR_DEPARTMENT_OTHER): Payer: Medicare Other

## 2017-02-04 DIAGNOSIS — C61 Malignant neoplasm of prostate: Secondary | ICD-10-CM | POA: Diagnosis not present

## 2017-02-04 LAB — COMPREHENSIVE METABOLIC PANEL
ALT: 11 U/L (ref 0–55)
AST: 14 U/L (ref 5–34)
Albumin: 3.7 g/dL (ref 3.5–5.0)
Alkaline Phosphatase: 82 U/L (ref 40–150)
Anion Gap: 8 mEq/L (ref 3–11)
BUN: 14.1 mg/dL (ref 7.0–26.0)
CALCIUM: 9.2 mg/dL (ref 8.4–10.4)
CHLORIDE: 104 meq/L (ref 98–109)
CO2: 26 meq/L (ref 22–29)
CREATININE: 1.1 mg/dL (ref 0.7–1.3)
EGFR: 62 mL/min/{1.73_m2} — ABNORMAL LOW (ref >90)
Glucose: 91 mg/dl (ref 70–140)
POTASSIUM: 3.7 meq/L (ref 3.5–5.1)
SODIUM: 138 meq/L (ref 136–145)
Total Bilirubin: 0.67 mg/dL (ref 0.20–1.20)
Total Protein: 6.5 g/dL (ref 6.4–8.3)

## 2017-02-04 LAB — CBC WITH DIFFERENTIAL/PLATELET
BASO%: 0.5 % (ref 0.0–2.0)
Basophils Absolute: 0 10*3/uL (ref 0.0–0.1)
EOS ABS: 0.5 10*3/uL (ref 0.0–0.5)
EOS%: 8.2 % — ABNORMAL HIGH (ref 0.0–7.0)
HEMATOCRIT: 41.4 % (ref 38.4–49.9)
HEMOGLOBIN: 13.7 g/dL (ref 13.0–17.1)
LYMPH#: 0.9 10*3/uL (ref 0.9–3.3)
LYMPH%: 13.3 % — ABNORMAL LOW (ref 14.0–49.0)
MCH: 30.3 pg (ref 27.2–33.4)
MCHC: 33.1 g/dL (ref 32.0–36.0)
MCV: 91.6 fL (ref 79.3–98.0)
MONO#: 0.6 10*3/uL (ref 0.1–0.9)
MONO%: 9.9 % (ref 0.0–14.0)
NEUT%: 68.1 % (ref 39.0–75.0)
NEUTROS ABS: 4.3 10*3/uL (ref 1.5–6.5)
PLATELETS: 259 10*3/uL (ref 140–400)
RBC: 4.52 10*6/uL (ref 4.20–5.82)
RDW: 13.6 % (ref 11.0–14.6)
WBC: 6.4 10*3/uL (ref 4.0–10.3)

## 2017-02-05 LAB — PSA: Prostate Specific Ag, Serum: 9.6 ng/mL — ABNORMAL HIGH (ref 0.0–4.0)

## 2017-02-06 ENCOUNTER — Encounter (HOSPITAL_COMMUNITY)
Admission: RE | Admit: 2017-02-06 | Discharge: 2017-02-06 | Disposition: A | Discharge status: Home / Self Care | Payer: Medicare Other | Source: Ambulatory Visit | Attending: Oncology | Admitting: Oncology

## 2017-02-06 DIAGNOSIS — S4991XA Unspecified injury of right shoulder and upper arm, initial encounter: Secondary | ICD-10-CM | POA: Diagnosis not present

## 2017-02-06 DIAGNOSIS — C61 Malignant neoplasm of prostate: Secondary | ICD-10-CM | POA: Diagnosis present

## 2017-02-06 MED ORDER — TECHNETIUM TC 99M MEDRONATE IV KIT
21.1000 | PACK | Freq: Once | INTRAVENOUS | Status: AC | PRN
  Administered 2017-02-06: 21.1 via INTRAVENOUS

## 2017-02-07 ENCOUNTER — Ambulatory Visit: Payer: Medicare Other | Admitting: Oncology

## 2017-02-07 DIAGNOSIS — L720 Epidermal cyst: Secondary | ICD-10-CM | POA: Diagnosis not present

## 2017-02-07 DIAGNOSIS — D485 Neoplasm of uncertain behavior of skin: Secondary | ICD-10-CM | POA: Diagnosis not present

## 2017-02-08 DIAGNOSIS — R9721 Rising PSA following treatment for malignant neoplasm of prostate: Secondary | ICD-10-CM | POA: Diagnosis not present

## 2017-02-08 DIAGNOSIS — E291 Testicular hypofunction: Secondary | ICD-10-CM | POA: Diagnosis not present

## 2017-02-08 DIAGNOSIS — C61 Malignant neoplasm of prostate: Secondary | ICD-10-CM | POA: Diagnosis not present

## 2017-02-08 DIAGNOSIS — Z192 Hormone resistant malignancy status: Secondary | ICD-10-CM | POA: Diagnosis not present

## 2017-02-11 ENCOUNTER — Encounter: Payer: Self-pay | Admitting: *Deleted

## 2017-02-14 ENCOUNTER — Ambulatory Visit (HOSPITAL_BASED_OUTPATIENT_CLINIC_OR_DEPARTMENT_OTHER): Payer: Medicare Other | Admitting: Oncology

## 2017-02-14 DIAGNOSIS — E291 Testicular hypofunction: Secondary | ICD-10-CM

## 2017-02-14 DIAGNOSIS — C61 Malignant neoplasm of prostate: Secondary | ICD-10-CM | POA: Diagnosis not present

## 2017-02-14 DIAGNOSIS — R946 Abnormal results of thyroid function studies: Secondary | ICD-10-CM

## 2017-02-14 DIAGNOSIS — R5383 Other fatigue: Secondary | ICD-10-CM

## 2017-02-14 NOTE — Progress Notes (Signed)
Hematology and Oncology Follow Up Visit  Edward Bailey [id number] 1936-04-20 81 y.o. 02/14/2017 9:34 AM Edward Bailey, MDGriffin, Edward Reichmann, MD   Principle Diagnosis: 81 year old gentleman with prostate cancer diagnosed in 1999 with a Gleason score 3+4 equals 7 and a PSA of around 4. He is currently experiencing a biochemical relapse without any measurable disease.   Prior Therapy:  He is status post prostatectomy and found to have stage T3b disease. He received salvage radiation therapy for a rise in his PSA.  He developed biochemical relapse and was treated with androgen depravation intermittently and most recently continuously with excellent response between 2010 to about 2015.  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 Ketoconazole at 200 mg twice a day and prednisone 5 mg daily. Therapy discontinued on 05/24/2016 because of progression of disease.  Current therapy: Zytiga 1000 mg daily with prednisone at 5 mg daily started in November 2017.  Interim History:  Edward Bailey presents today for a followup visit. Since the last visit, he reports doing well overall. He was diagnosed with sleep apnea and currently using CPAP machine which have helped his fatigue and dyspnea. He reports improvement in his overall energy.  He continues to take Zytiga without any new complications. He denied any edema or bone pain. His performance status and activity level remains about the same. He denied any bone pain or pathological fractures. He denied any nausea or decrease in his appetite. Overall his quality of life is unchanged.   He does not report any headaches, blurred vision or syncope. He does not report any fevers, chills or sweats. He does not report any chest pain, shortness of breath, leg edema or palpitation. He does not report any wheezing, cough or hemoptysis. He does not report any skeletal complaints. He is not report any  lymphadenopathy or petechiae. The rest of his review of systems unremarkable.      Medications: I have reviewed the patient's current medications.  Current Outpatient Prescriptions  Medication Sig Dispense Refill  . aspirin (ASPIRIN EC) 81 MG EC tablet Take 81 mg by mouth daily. Swallow whole.    Marland Kitchen azelastine (ASTELIN) 0.1 % nasal spray Place 1-2 sprays into both nostrils daily as needed. allergies  5  . cetirizine (ZYRTEC) 10 MG tablet Take 10 mg by mouth daily as needed for allergies. As needed    . fluticasone (FLONASE) 50 MCG/ACT nasal spray Place 1 spray into both nostrils daily as needed. Allergies  5  . levothyroxine (SYNTHROID, LEVOTHROID) 112 MCG tablet TK 1 T PO QD IN THE MORNING OES  12  . levothyroxine (SYNTHROID, LEVOTHROID) 125 MCG tablet Alternates with 112 mcg  9  . Multiple Vitamins-Minerals (MULTIVITAMIN PO) Take 1 capsule by mouth daily.    . predniSONE (DELTASONE) 5 MG tablet TAKE 1 TABLET BY MOUTH ONCE DAILY WITH BREAKFAST 90 tablet 0  . ZYTIGA 250 MG tablet TAKE 4 TABLETS BY MOUTH DAILY. TAKE ON AN EMPTY STOMACH 1 HOUR BEFORE OR 2 HOURS AFTER A MEAL 120 tablet 0   No current facility-administered medications for this visit.      Allergies:  Allergies  Allergen Reactions  . Amoxicillin Rash    Has patient had a PCN reaction causing immediate rash, facial/tongue/throat swelling, SOB or lightheadedness with hypotension: No Has patient had a PCN reaction causing severe rash involving mucus membranes or skin necrosis: No Has patient had a PCN reaction that required hospitalization No Has patient had  a PCN reaction occurring within the last 10 years: Yes If all of the above answers are "NO", then may proceed with Cephalosporin use.     Past Medical History, Surgical history, Social history, and Family History were reviewed and updated.   Physical Exam: ECOG: 0 General appearance: Well-appearing gentleman without distress. Head: Normocephalic, without obvious  abnormality no oral ulcers or lesions. Neck: no adenopathy or neck masses. Lymph nodes: Cervical, supraclavicular, and axillary nodes normal. Heart:regular rate and rhythm, S1, S2 normal, no murmur, click, rub or gallop Lung:chest clear, no wheezing, rales, normal symmetric air entry. Expiratory wheezes noted. Abdomin: soft, non-tender, without masses or organomegaly. No rebound or guarding. EXT:no erythema, induration, or nodules.  Skin: No ecchymosis or erythema.   Lab Results: Lab Results  Component Value Date   WBC 6.4 02/04/2017   HGB 13.7 02/04/2017   HCT 41.4 02/04/2017   MCV 91.6 02/04/2017   PLT 259 02/04/2017     Chemistry      Component Value Date/Time   NA 138 02/04/2017 1016   K 3.7 02/04/2017 1016   CL 104 06/22/2008 1042   CO2 26 02/04/2017 1016   BUN 14.1 02/04/2017 1016   CREATININE 1.1 02/04/2017 1016      Component Value Date/Time   CALCIUM 9.2 02/04/2017 1016   ALKPHOS 82 02/04/2017 1016   AST 14 02/04/2017 1016   ALT 11 02/04/2017 1016   BILITOT 0.67 02/04/2017 1016     Results for Edward Bailey, Edward Bailey (MRN [id number]) as of 02/14/2017 09:28  Ref. Range 12/18/2016 11:43 02/04/2017 10:16  Prostate Specific Ag, Serum Latest Ref Range: 0.0 - 4.0 ng/mL 10.3 (H) 9.6 (H)   EXAM: CT ABDOMEN AND PELVIS WITH CONTRAST  TECHNIQUE: Multidetector CT imaging of the abdomen and pelvis was performed using the standard protocol following bolus administration of intravenous contrast.  CONTRAST:  96mL ISOVUE-300 IOPAMIDOL (ISOVUE-300) INJECTION 61%  COMPARISON:  03/29/2015, 04/02/2014 and 10/21/2009.  FINDINGS: Lower chest: Lung bases show no acute findings. Coronary artery calcification. Heart size normal. No pericardial or pleural effusion.  Hepatobiliary: Liver and gallbladder are unremarkable. No biliary ductal dilatation.  Pancreas: Negative.  Spleen: Negative.  Adrenals/Urinary Tract: Adrenal glands and kidneys are unremarkable. Ureters are  decompressed. TURP defect is seen in the bladder which is otherwise grossly unremarkable.  Stomach/Bowel: Stomach, small bowel, appendix and colon are unremarkable.  Vascular/Lymphatic: Atherosclerotic calcification of the arterial vasculature without abdominal aortic aneurysm. No pathologically enlarged lymph nodes. Lymph node dissection clips are seen along the pelvic sidewalls bilaterally.  Reproductive: Surgically absent.  Other: No free fluid. Small bilateral inguinal hernias contain fat. Mesenteries and peritoneum are otherwise unremarkable.  Musculoskeletal: A sclerotic lesion in the left aspect of the sacrum is seen adjacent to the sacroiliac joint, measuring 1.3 x 1.8 cm (series 2, image 80), slightly increased in size from 03/29/2015, at which time it measured 0.9 x 1.4 cm. It is new from 10/21/2009. No additional worrisome lytic or sclerotic lesions. Degenerative changes are seen in the spine.  IMPRESSION: 1. Sclerotic lesion in the left aspect of the sacrum is slightly larger than on recent prior examinations and new from 10/21/2009, raising suspicion for metastatic disease. No additional evidence of metastatic disease.   Impression and Plan:  81 year old gentleman with the following issues:   1. Prostate cancer diagnosed in 1999 with a Gleason score 3+4 equals 7 and a PSA of around 4. He is status post prostatectomy and found to have stage T3b disease. He developed a  biochemical relapse and treated with intermittent androgen deprivation his PSA was up to 17.17 with possible sacral metastasis.  He was treated initially with ketoconazole and prednisone and progressed in October 2017. Bone scan obtained on 06/19/2016 did not show any widespread metastatic bony disease. He continues to have questionable area of uptake in the left sacroiliac joint.  He is currently on Zytiga started November 2017 With an initial response to PSA with a drop to 6.7.   His most  recent PSA continues to be under reasonable control and slightly declined to 9.6. Bone scan did not show any change with very little to no bone metastasis.  Risks and benefits of continuing Zytiga were reviewed today he is agreeable to continue. We'll continue to monitor his PSA periodically.  2. Androgen depravation: This will be continued indefinitely under the care of Dahlstedt.  3. Liver function surveillance: His liver function test normal in July 2018.  4. Hyperthyroidism: He developed hypothyroidism at this time after radioablation. Currently on thyroid supplements.  5. Fatigue: Improved with correcting his sleep apnea.  6. Dyspnea: Likely related to his sleep apnea which improved at this time.  7. Followup: In 6-8 weeks.      [facility], MD 7/26/20189:34 AM

## 2017-02-20 ENCOUNTER — Other Ambulatory Visit: Payer: Self-pay | Admitting: Oncology

## 2017-02-20 MED FILL — predniSONE 5 MG TABS: 5 | 90 days supply | Qty: 90 | Fill #0

## 2017-02-20 MED FILL — ZYTIGA 250 MG TABLET: 250 | 30 days supply | Qty: 120 | Fill #1

## 2017-02-27 ENCOUNTER — Other Ambulatory Visit (HOSPITAL_BASED_OUTPATIENT_CLINIC_OR_DEPARTMENT_OTHER): Payer: Self-pay

## 2017-02-27 DIAGNOSIS — G4731 Primary central sleep apnea: Secondary | ICD-10-CM

## 2017-03-01 DIAGNOSIS — L245 Irritant contact dermatitis due to other chemical products: Secondary | ICD-10-CM | POA: Diagnosis not present

## 2017-03-02 DIAGNOSIS — G4733 Obstructive sleep apnea (adult) (pediatric): Secondary | ICD-10-CM | POA: Diagnosis not present

## 2017-03-04 ENCOUNTER — Other Ambulatory Visit (HOSPITAL_BASED_OUTPATIENT_CLINIC_OR_DEPARTMENT_OTHER): Payer: Self-pay

## 2017-03-04 ENCOUNTER — Ambulatory Visit (HOSPITAL_BASED_OUTPATIENT_CLINIC_OR_DEPARTMENT_OTHER): Payer: Medicare Other | Attending: Internal Medicine | Admitting: Internal Medicine

## 2017-03-04 DIAGNOSIS — G4731 Primary central sleep apnea: Secondary | ICD-10-CM

## 2017-03-04 DIAGNOSIS — Z79899 Other long term (current) drug therapy: Secondary | ICD-10-CM | POA: Diagnosis not present

## 2017-03-04 DIAGNOSIS — I493 Ventricular premature depolarization: Secondary | ICD-10-CM | POA: Insufficient documentation

## 2017-03-12 DIAGNOSIS — G4733 Obstructive sleep apnea (adult) (pediatric): Secondary | ICD-10-CM | POA: Diagnosis not present

## 2017-03-12 NOTE — Procedures (Signed)
   NAME: Edward Bailey DATE OF BIRTH:  1936-02-16 MEDICAL RECORD NUMBER [id number]  LOCATION: [facility]  PHYSICIAN: Carlena Sax  DATE OF STUDY: 03/04/2017  SLEEP STUDY TYPE: Positive Airway Pressure Titration               REFERRING PHYSICIAN: Marius Ditch, MD  INDICATION FOR STUDY: Persisting central apnea on BPAP. PSG 11/27/16 with AHI 62.7/hr. PAP titration 11/27/16 with AHI 24/hr on BPAP 15/11. Central apnea did not disappear on longer use of BPAP.   EPWORTH SLEEPINESS SCORE:   HEIGHT: 6\' 2"  (188 cm)  WEIGHT: 235 lb (106.6 kg)    Body mass index is 30.17 kg/m.  NECK SIZE: 17.5 in.  MEDICATIONS Patient self administered medications include: LEVOTHYROXINE, MELATONIN. Medications administered during study include Sleep medicine administered - MELATONIN at 10:00:24 PM.  SLEEP STUDY TECHNIQUE The patient underwent an attended overnight polysomnography titration to assess the effects of cpap therapy. The following variables were monitored: EEG (C4-A1, C3-A2, O1-A2, O2-A1, F3-M2, F4-M1), EOG, submental and leg EMG, ECG, oxyhemoglobin saturation by pulse oximetry, thoracic and abdominal respiratory effort belts, nasal/oral airflow by pressure sensor, body position sensor and snoring sensor. ASV was used to eliminate apneas, hypopneas and oxygen desaturation.  TECHNICAL COMMENTS Comments added by Technician: NO RESTROOM VISTED. Patient had difficulty initiating sleep.  Comments added by Scorer: N/A  SLEEP ARCHITECTURE The study was initiated at 10:12:59 PM and terminated at 5:05:26 AM. The total recorded time was 412.5 minutes. EEG confirmed total sleep time was 294.2 minutes yielding a sleep efficiency of 71.3%. Sleep onset after lights out was 28.2 minutes with a REM latency of 158.5 minutes. The patient spent 5.44% of the night in stage N1 sleep, 55.82% in stage N2 sleep, 0.17% in stage N3 and 38.58% in REM. Wake after sleep onset (WASO) was 90.0 minutes. The  Arousal Index was 7.7/hour.  RESPIRATORY PARAMETERS The most appropriate setting of ASV was EPAP Min 5 and Max 8 cmH2O, Pressure Support Min 3 and Max 6 cmH2O with Max Pressure of 25 cmH2O and Breath Rate of Auto BrPM.  LEG MOVEMENT DATA The periodic limb movement index was 43.64/hour with an associated arousal index of 0.4/hour.  CARDIAC DATA The underlying cardiac rhythm was most consistent with sinus rhythm. Mean heart rate during sleep was 54.41 bpm. Additional rhythm abnormalities include PVCs.  IMPRESSIONS Successful ASV titration with settings of EPAP min 5 and max 8, PS min 3 and max 6 with max pressure of 15 (if max pressure setting needed)  DIAGNOSIS - Central sleep apnea without Cheyne-Stokes Breathing (G47.37)  RECOMMENDATIONS - ASV-Auto with minimum EPAP 5 and maximum EPAP 8, Pressure support minimum of 3 and maximum of 6, Auto backup rate, max pressure of 15 (if max pressure setting needed)  [place], American Board of Sleep Medicine  ELECTRONICALLY SIGNED ON:  03/12/2017, 8:33 PM [place] PH: [phone]   FX: [phone] [place]

## 2017-03-15 ENCOUNTER — Other Ambulatory Visit: Payer: Self-pay | Admitting: Oncology

## 2017-03-19 ENCOUNTER — Other Ambulatory Visit: Payer: Self-pay | Admitting: Oncology

## 2017-03-22 DIAGNOSIS — G4733 Obstructive sleep apnea (adult) (pediatric): Secondary | ICD-10-CM | POA: Diagnosis not present

## 2017-03-26 ENCOUNTER — Encounter (HOSPITAL_BASED_OUTPATIENT_CLINIC_OR_DEPARTMENT_OTHER): Payer: Medicare Other

## 2017-03-27 MED FILL — ZYTIGA 250 MG TABLET: 250 | 30 days supply | Qty: 120 | Fill #0

## 2017-04-02 ENCOUNTER — Other Ambulatory Visit: Payer: Medicare Other

## 2017-04-02 DIAGNOSIS — G4733 Obstructive sleep apnea (adult) (pediatric): Secondary | ICD-10-CM | POA: Diagnosis not present

## 2017-04-03 ENCOUNTER — Ambulatory Visit: Payer: Medicare Other | Admitting: Oncology

## 2017-04-09 ENCOUNTER — Other Ambulatory Visit (HOSPITAL_BASED_OUTPATIENT_CLINIC_OR_DEPARTMENT_OTHER): Payer: Medicare Other

## 2017-04-09 DIAGNOSIS — C61 Malignant neoplasm of prostate: Secondary | ICD-10-CM

## 2017-04-09 LAB — COMPREHENSIVE METABOLIC PANEL
ALT: 10 U/L (ref 0–55)
AST: 14 U/L (ref 5–34)
Albumin: 3.6 g/dL (ref 3.5–5.0)
Alkaline Phosphatase: 89 U/L (ref 40–150)
Anion Gap: 7 mEq/L (ref 3–11)
BUN: 16.6 mg/dL (ref 7.0–26.0)
CO2: 26 meq/L (ref 22–29)
CREATININE: 1.1 mg/dL (ref 0.7–1.3)
Calcium: 9.3 mg/dL (ref 8.4–10.4)
Chloride: 108 mEq/L (ref 98–109)
EGFR: 61 mL/min/{1.73_m2} — ABNORMAL LOW (ref >90)
GLUCOSE: 95 mg/dL (ref 70–140)
Potassium: 4.3 mEq/L (ref 3.5–5.1)
Sodium: 142 mEq/L (ref 136–145)
Total Bilirubin: 0.65 mg/dL (ref 0.20–1.20)
Total Protein: 6.5 g/dL (ref 6.4–8.3)

## 2017-04-09 LAB — CBC WITH DIFFERENTIAL/PLATELET
BASO%: 0.9 % (ref 0.0–2.0)
Basophils Absolute: 0.1 10*3/uL (ref 0.0–0.1)
EOS%: 6.9 % (ref 0.0–7.0)
Eosinophils Absolute: 0.5 10*3/uL (ref 0.0–0.5)
HCT: 40.6 % (ref 38.4–49.9)
HGB: 13.7 g/dL (ref 13.0–17.1)
LYMPH%: 12 % — AB (ref 14.0–49.0)
MCH: 30.7 pg (ref 27.2–33.4)
MCHC: 33.8 g/dL (ref 32.0–36.0)
MCV: 90.8 fL (ref 79.3–98.0)
MONO#: 0.7 10*3/uL (ref 0.1–0.9)
MONO%: 9.1 % (ref 0.0–14.0)
NEUT%: 71.1 % (ref 39.0–75.0)
NEUTROS ABS: 5.3 10*3/uL (ref 1.5–6.5)
Platelets: 263 10*3/uL (ref 140–400)
RBC: 4.48 10*6/uL (ref 4.20–5.82)
RDW: 13.5 % (ref 11.0–14.6)
WBC: 7.4 10*3/uL (ref 4.0–10.3)
lymph#: 0.9 10*3/uL (ref 0.9–3.3)

## 2017-04-10 ENCOUNTER — Ambulatory Visit (HOSPITAL_BASED_OUTPATIENT_CLINIC_OR_DEPARTMENT_OTHER): Payer: Medicare Other | Admitting: Oncology

## 2017-04-10 ENCOUNTER — Telehealth: Payer: Self-pay | Admitting: Oncology

## 2017-04-10 VITALS — BP 163/86 | HR 64 | Temp 98.0°F | Resp 18 | Ht 74.0 in | Wt 233.4 lb

## 2017-04-10 DIAGNOSIS — E038 Other specified hypothyroidism: Secondary | ICD-10-CM

## 2017-04-10 DIAGNOSIS — E291 Testicular hypofunction: Secondary | ICD-10-CM

## 2017-04-10 DIAGNOSIS — C61 Malignant neoplasm of prostate: Secondary | ICD-10-CM

## 2017-04-10 LAB — PSA: PROSTATE SPECIFIC AG, SERUM: 11.1 ng/mL — AB (ref 0.0–4.0)

## 2017-04-10 NOTE — Telephone Encounter (Signed)
Gave avs and calendar for november °

## 2017-04-10 NOTE — Progress Notes (Signed)
Hematology and Oncology Follow Up Visit  Edward Bailey [id number] 1936/02/02 81 y.o. 04/10/2017 1:30 PM Edward Bailey, MDGriffin, Edward Reichmann, MD   Principle Diagnosis: 81 year old gentleman with prostate cancer diagnosed in 1999 with a Gleason score 3+4 equals 7 and a PSA of around 4. He is currently experiencing a biochemical relapse without any measurable disease.   Prior Therapy:  He is status post prostatectomy and found to have stage T3b disease. He received salvage radiation therapy for a rise in his PSA.  He developed biochemical relapse and was treated with androgen depravation intermittently and most recently continuously with excellent response between 2010 to about 2015.  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 Ketoconazole at 200 mg twice a day and prednisone 5 mg daily. Therapy discontinued on 05/24/2016 because of progression of disease.  Current therapy: Zytiga 1000 mg daily with prednisone at 5 mg daily started in November 2017.  Interim History:  Edward Bailey presents today for a followup visit. Since the last visit, he reports no major changes in his health. He continues to report improvement in his energy since he been treated for sleep apnea and currently using CPAP. He continues to take Zytiga without any new complications. He denied any edema or bone pain. His performance status and activity level remains about the same. He denied any bone pain or pathological fractures. His appetite has not changed and weight is stable. He denied any recent hospitalizations or illnesses. His quality of life remains excellent. He has no difficulties obtaining Zytiga or taking it.   He does not report any headaches, blurred vision or syncope. He does not report any fevers, chills or sweats. He does not report any chest pain, shortness of breath, leg edema or palpitation. He does not report any wheezing, cough or hemoptysis.  He does not report any skeletal complaints. He is not report any lymphadenopathy or petechiae. The rest of his review of systems unremarkable.      Medications: I have reviewed the patient's current medications.  Current Outpatient Prescriptions  Medication Sig Dispense Refill  . aspirin (ASPIRIN EC) 81 MG EC tablet Take 81 mg by mouth daily. Swallow whole.    Marland Kitchen azelastine (ASTELIN) 0.1 % nasal spray Place 1-2 sprays into both nostrils daily as needed. allergies  5  . cetirizine (ZYRTEC) 10 MG tablet Take 10 mg by mouth daily as needed for allergies. As needed    . fluticasone (FLONASE) 50 MCG/ACT nasal spray Place 1 spray into both nostrils daily as needed. Allergies  5  . levothyroxine (SYNTHROID, LEVOTHROID) 112 MCG tablet TK 1 T PO QD IN THE MORNING OES  12  . levothyroxine (SYNTHROID, LEVOTHROID) 125 MCG tablet Alternates with 112 mcg  9  . Multiple Vitamins-Minerals (MULTIVITAMIN PO) Take 1 capsule by mouth daily.    . predniSONE (DELTASONE) 5 MG tablet TAKE 1 TABLET BY MOUTH ONCE DAILY WITH BREAKFAST 90 tablet 0  . ZYTIGA 250 MG tablet TAKE 4 TABLETS BY MOUTH DAILY. TAKE ON AN EMPTY STOMACH 1 HOUR BEFORE OR 2 HOURS AFTER A MEAL 120 tablet 1   No current facility-administered medications for this visit.      Allergies:  Allergies  Allergen Reactions  . Amoxicillin Rash    Has patient had a PCN reaction causing immediate rash, facial/tongue/throat swelling, SOB or lightheadedness with hypotension: No Has patient had a PCN reaction causing severe rash involving mucus membranes or skin necrosis: No Has patient  had a PCN reaction that required hospitalization No Has patient had a PCN reaction occurring within the last 10 years: Yes If all of the above answers are "NO", then may proceed with Cephalosporin use.     Past Medical History, Surgical history, Social history, and Family History were reviewed and updated.   Physical Exam: Blood pressure (!) 163/86, pulse 64,  temperature 98 F (36.7 C), temperature source Oral, resp. rate 18, height 6\' 2"  (1.88 m), weight 233 lb 6.4 oz (105.9 kg), SpO2 97 %.   ECOG: 0 General appearance: Alert, awake gentleman without distress. Head: Normocephalic, without obvious abnormality no oral thrush. Neck: no adenopathy or neck masses. Lymph nodes: Cervical, supraclavicular, and axillary nodes normal. Heart:regular rate and rhythm, S1, S2 normal, no murmur, click, rub or gallop Lung:chest clear, no wheezing, rales, normal symmetric air entry. Expiratory wheezes noted. Abdomin: soft, non-tender, without masses or organomegaly. No shifting dullness or ascites. EXT:no erythema, induration, or nodules.  Skin: No ecchymosis or erythema.   Lab Results: Lab Results  Component Value Date   WBC 7.4 04/09/2017   HGB 13.7 04/09/2017   HCT 40.6 04/09/2017   MCV 90.8 04/09/2017   PLT 263 04/09/2017     Chemistry      Component Value Date/Time   NA 142 04/09/2017 1021   K 4.3 04/09/2017 1021   CL 104 06/22/2008 1042   CO2 26 04/09/2017 1021   BUN 16.6 04/09/2017 1021   CREATININE 1.1 04/09/2017 1021      Component Value Date/Time   CALCIUM 9.3 04/09/2017 1021   ALKPHOS 89 04/09/2017 1021   AST 14 04/09/2017 1021   ALT 10 04/09/2017 1021   BILITOT 0.65 04/09/2017 1021      Impression and Plan:  81 year old gentleman with the following issues:   1. Prostate cancer diagnosed in 1999 with a Gleason score 3+4 equals 7 and a PSA of around 4. He is status post prostatectomy and found to have stage T3b disease. He developed a biochemical relapse and treated with intermittent androgen deprivation his PSA was up to 17.17 with possible sacral metastasis.  He was treated initially with ketoconazole and prednisone and progressed in October 2017. Bone scan obtained on 06/19/2016 did not show any widespread metastatic bony disease. He continues to have questionable area of uptake in the left sacroiliac joint.  He is  currently on Zytiga started November 2017 With an initial response to PSA with a drop to 6.7.   PSA in September 2018 continues to be relatively stable. Imaging studies from July and May 2018 showed no clear-cut progression of disease.  The plan is to continue with the same dose and schedule unless he develops progression of disease.  2. Androgen depravation: This will be continued indefinitely under the care of Dahlstedt.  3. Liver function surveillance: Remains normal and September 2018.  4. Hyperthyroidism: He developed hypothyroidism at this time after radioablation. Currently on thyroid supplements.  5. Fatigue: Improved with correcting his sleep apnea.  6. Followup: In 8 weeks.      Zola Button, MD 9/19/20181:30 PM

## 2017-04-16 DIAGNOSIS — Z23 Encounter for immunization: Secondary | ICD-10-CM | POA: Diagnosis not present

## 2017-04-19 ENCOUNTER — Encounter: Payer: Self-pay | Admitting: Oncology

## 2017-04-19 MED FILL — ZYTIGA 250 MG TABLET: 250 | 30 days supply | Qty: 120 | Fill #1

## 2017-04-21 DIAGNOSIS — G4733 Obstructive sleep apnea (adult) (pediatric): Secondary | ICD-10-CM | POA: Diagnosis not present

## 2017-05-20 DIAGNOSIS — G4731 Primary central sleep apnea: Secondary | ICD-10-CM | POA: Diagnosis not present

## 2017-05-22 DIAGNOSIS — G4733 Obstructive sleep apnea (adult) (pediatric): Secondary | ICD-10-CM | POA: Diagnosis not present

## 2017-05-24 ENCOUNTER — Other Ambulatory Visit: Payer: Self-pay | Admitting: Oncology

## 2017-05-24 MED FILL — predniSONE 5 MG TABS: 5 | 90 days supply | Qty: 90 | Fill #0

## 2017-05-24 MED FILL — ZYTIGA 250 MG TABLET: 250 | 30 days supply | Qty: 120 | Fill #0

## 2017-06-07 ENCOUNTER — Other Ambulatory Visit (HOSPITAL_BASED_OUTPATIENT_CLINIC_OR_DEPARTMENT_OTHER): Payer: Medicare Other

## 2017-06-07 DIAGNOSIS — C61 Malignant neoplasm of prostate: Secondary | ICD-10-CM

## 2017-06-07 DIAGNOSIS — E291 Testicular hypofunction: Secondary | ICD-10-CM | POA: Diagnosis not present

## 2017-06-07 LAB — CBC WITH DIFFERENTIAL/PLATELET
BASO%: 1.2 % (ref 0.0–2.0)
BASOS ABS: 0.1 10*3/uL (ref 0.0–0.1)
EOS ABS: 0.6 10*3/uL — AB (ref 0.0–0.5)
EOS%: 8.3 % — ABNORMAL HIGH (ref 0.0–7.0)
HCT: 42.7 % (ref 38.4–49.9)
HGB: 14.3 g/dL (ref 13.0–17.1)
LYMPH%: 12.9 % — AB (ref 14.0–49.0)
MCH: 30 pg (ref 27.2–33.4)
MCHC: 33.4 g/dL (ref 32.0–36.0)
MCV: 89.6 fL (ref 79.3–98.0)
MONO#: 0.6 10*3/uL (ref 0.1–0.9)
MONO%: 8.5 % (ref 0.0–14.0)
NEUT%: 69.1 % (ref 39.0–75.0)
NEUTROS ABS: 4.8 10*3/uL (ref 1.5–6.5)
Platelets: 274 10*3/uL (ref 140–400)
RBC: 4.77 10*6/uL (ref 4.20–5.82)
RDW: 13.4 % (ref 11.0–14.6)
WBC: 7 10*3/uL (ref 4.0–10.3)
lymph#: 0.9 10*3/uL (ref 0.9–3.3)

## 2017-06-07 LAB — COMPREHENSIVE METABOLIC PANEL
ALT: 12 U/L (ref 0–55)
AST: 14 U/L (ref 5–34)
Albumin: 3.7 g/dL (ref 3.5–5.0)
Alkaline Phosphatase: 112 U/L (ref 40–150)
Anion Gap: 8 mEq/L (ref 3–11)
BUN: 18.5 mg/dL (ref 7.0–26.0)
CO2: 26 meq/L (ref 22–29)
Calcium: 9 mg/dL (ref 8.4–10.4)
Chloride: 106 mEq/L (ref 98–109)
Creatinine: 1.2 mg/dL (ref 0.7–1.3)
EGFR: 59 mL/min/{1.73_m2} — AB (ref >60)
GLUCOSE: 85 mg/dL (ref 70–140)
POTASSIUM: 3.6 meq/L (ref 3.5–5.1)
SODIUM: 139 meq/L (ref 136–145)
Total Bilirubin: 0.66 mg/dL (ref 0.20–1.20)
Total Protein: 6.9 g/dL (ref 6.4–8.3)

## 2017-06-08 LAB — PSA: PROSTATE SPECIFIC AG, SERUM: 15.1 ng/mL — AB (ref 0.0–4.0)

## 2017-06-08 LAB — TESTOSTERONE

## 2017-06-11 ENCOUNTER — Ambulatory Visit (HOSPITAL_BASED_OUTPATIENT_CLINIC_OR_DEPARTMENT_OTHER): Payer: Medicare Other | Admitting: Oncology

## 2017-06-11 ENCOUNTER — Telehealth: Payer: Self-pay | Admitting: Oncology

## 2017-06-11 VITALS — BP 175/92 | HR 67 | Temp 98.2°F | Resp 18 | Ht 74.0 in | Wt 230.7 lb

## 2017-06-11 DIAGNOSIS — C61 Malignant neoplasm of prostate: Secondary | ICD-10-CM

## 2017-06-11 DIAGNOSIS — C7951 Secondary malignant neoplasm of bone: Secondary | ICD-10-CM

## 2017-06-11 DIAGNOSIS — E039 Hypothyroidism, unspecified: Secondary | ICD-10-CM

## 2017-06-11 DIAGNOSIS — E291 Testicular hypofunction: Secondary | ICD-10-CM

## 2017-06-11 NOTE — Telephone Encounter (Signed)
Gave avs and calendar for January 2019 °

## 2017-06-11 NOTE — Progress Notes (Signed)
Hematology and Oncology Follow Up Visit  Edward Bailey [id number] 04-22-1936 81 y.o. 06/11/2017 10:17 AM Edward Bailey, MDGriffin, Edward Reichmann, MD   Principle Diagnosis: 81 year old gentleman with prostate cancer diagnosed in 1999 with a Gleason score 3+4 equals 7 and a PSA of around 4. He is currently experiencing a biochemical relapse without any measurable disease.   Prior Therapy:  He is status post prostatectomy and found to have stage T3b disease. He received salvage radiation therapy for a rise in his PSA.  He developed biochemical relapse and was treated with androgen depravation intermittently and most recently continuously with excellent response between 2010 to about 2015.  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 Ketoconazole at 200 mg twice a day and prednisone 5 mg daily. Therapy discontinued on 05/24/2016 because of progression of disease.  Current therapy: Zytiga 1000 mg daily with prednisone at 5 mg daily started in November 2017.  Interim History:  Edward Bailey presents today for a followup visit. Since the last visit, he reports doing well overall without any complaints. He continues to take Zytiga without any new complications. He denied any edema or bone pain or pathological fractures. His appetite has not changed and weight is stable. He denied any recent hospitalizations or illnesses.  He has no difficulties obtaining Zytiga or taking it.  He denies any urination difficulties.  He denies any issues with thyroid or thyroid replacement.  His quality of life remains excellent.   He does not report any headaches, blurred vision or syncope. He does not report any fevers, chills or sweats. He does not report any chest pain, shortness of breath, leg edema or palpitation. He does not report any wheezing, cough or hemoptysis. He does not report any skeletal complaints. He is not report any lymphadenopathy or  petechiae. The rest of his review of systems unremarkable.      Medications: I have reviewed the patient's current medications.  Current Outpatient Medications  Medication Sig Dispense Refill  . aspirin (ASPIRIN EC) 81 MG EC tablet Take 81 mg by mouth daily. Swallow whole.    Marland Kitchen azelastine (ASTELIN) 0.1 % nasal spray Place 1-2 sprays into both nostrils daily as needed. allergies  5  . cetirizine (ZYRTEC) 10 MG tablet Take 10 mg by mouth daily as needed for allergies. As needed    . fluticasone (FLONASE) 50 MCG/ACT nasal spray Place 1 spray into both nostrils daily as needed. Allergies  5  . levothyroxine (SYNTHROID, LEVOTHROID) 112 MCG tablet TK 1 T PO QD IN THE MORNING OES  12  . levothyroxine (SYNTHROID, LEVOTHROID) 125 MCG tablet Alternates with 112 mcg  9  . Multiple Vitamins-Minerals (MULTIVITAMIN PO) Take 1 capsule by mouth daily.    . predniSONE (DELTASONE) 5 MG tablet TAKE 1 TABLET BY MOUTH ONCE DAILY WITH BREAKFAST 90 tablet 0  . ZYTIGA 250 MG tablet TAKE 4 TABLETS BY MOUTH DAILY. TAKE ON AN EMPTY STOMACH 1 HOUR BEFORE OR 2 HOURS AFTER A MEAL 120 tablet 1   No current facility-administered medications for this visit.      Allergies:  Allergies  Allergen Reactions  . Amoxicillin Rash    Has patient had a PCN reaction causing immediate rash, facial/tongue/throat swelling, SOB or lightheadedness with hypotension: No Has patient had a PCN reaction causing severe rash involving mucus membranes or skin necrosis: No Has patient had a PCN reaction that required hospitalization No Has patient had a PCN reaction occurring within  the last 10 years: Yes If all of the above answers are "NO", then may proceed with Cephalosporin use.     Past Medical History, Surgical history, Social history, and Family History were reviewed and updated.   Physical Exam: Blood pressure (!) 175/92, pulse 67, temperature 98.2 F (36.8 C), temperature source Oral, resp. rate 18, height 6\' 2"  (1.88 m),  weight 230 lb 11.2 oz (104.6 kg), SpO2 98 %.   ECOG: 0 General appearance: Well-appearing gentleman without distress. Head: Normocephalic, without obvious abnormality no oral ulcers or lesion. Neck: no adenopathy or neck masses. Lymph nodes: Cervical, supraclavicular, and axillary nodes normal. Heart:regular rate and rhythm, S1, S2 normal, no murmur, click, rub or gallop Lung:chest clear, no wheezing, rales, normal symmetric air entry.  Abdomin: soft, non-tender, without masses or organomegaly. No rebound or guarding. EXT:no erythema, induration, or nodules.  Skin: No ecchymosis or erythema.   Lab Results: Lab Results  Component Value Date   WBC 7.0 06/07/2017   HGB 14.3 06/07/2017   HCT 42.7 06/07/2017   MCV 89.6 06/07/2017   PLT 274 06/07/2017     Chemistry      Component Value Date/Time   NA 139 06/07/2017 1057   K 3.6 06/07/2017 1057   CL 104 06/22/2008 1042   CO2 26 06/07/2017 1057   BUN 18.5 06/07/2017 1057   CREATININE 1.2 06/07/2017 1057      Component Value Date/Time   CALCIUM 9.0 06/07/2017 1057   ALKPHOS 112 06/07/2017 1057   AST 14 06/07/2017 1057   ALT 12 06/07/2017 1057   BILITOT 0.66 06/07/2017 1057      Impression and Plan:  81 year old gentleman with the following issues:   1. Prostate cancer diagnosed in 1999 with a Gleason score 3+4 equals 7 and a PSA of around 4. He is status post prostatectomy and found to have stage T3b disease. He developed a biochemical relapse and treated with intermittent androgen deprivation his PSA was up to 17.17 with possible sacral metastasis.  He was treated initially with ketoconazole and prednisone and progressed in October 2017. Bone scan obtained on 06/19/2016 did not show any widespread metastatic bony disease. He continues to have questionable area of uptake in the left sacroiliac joint.  He is currently on Zytiga started November 2017 With an initial response to PSA with a drop to 6.7.   PSA on June 07, 2017 showed slight increase but he remains completely asymptomatic.  Imaging studies in the last 6 months reviewed and showed no changes in his malignancy.  The plan is to continue with this medication at this time and repeat staging workup in 2019.  Different salvage therapy options were reviewed today which include a different oral hormone therapy versus chemotherapy.  We will defer these options unless he develops symptomatic progression.  2. Androgen depravation: This will be continued indefinitely under the care of Dahlstedt.  3. Liver function surveillance: Remains normal in November 2018.  4. Hyperthyroidism: He developed hypothyroidism at this time after radioablation. Currently on thyroid supplements.  5. Followup: In 8 weeks.      Zola Button, MD 11/20/201810:17 AM

## 2017-06-17 MED FILL — ZYTIGA 250 MG TABLET: 250 | 30 days supply | Qty: 120 | Fill #1

## 2017-06-19 DIAGNOSIS — R9721 Rising PSA following treatment for malignant neoplasm of prostate: Secondary | ICD-10-CM | POA: Diagnosis not present

## 2017-06-19 DIAGNOSIS — Z192 Hormone resistant malignancy status: Secondary | ICD-10-CM | POA: Diagnosis not present

## 2017-06-19 DIAGNOSIS — C61 Malignant neoplasm of prostate: Secondary | ICD-10-CM | POA: Diagnosis not present

## 2017-06-21 DIAGNOSIS — G4733 Obstructive sleep apnea (adult) (pediatric): Secondary | ICD-10-CM | POA: Diagnosis not present

## 2017-06-25 DIAGNOSIS — E039 Hypothyroidism, unspecified: Secondary | ICD-10-CM | POA: Diagnosis not present

## 2017-07-09 ENCOUNTER — Other Ambulatory Visit: Payer: Self-pay | Admitting: Oncology

## 2017-07-11 MED FILL — ZYTIGA 250 MG TABLET: 250 | 30 days supply | Qty: 120 | Fill #0

## 2017-08-13 ENCOUNTER — Inpatient Hospital Stay: Payer: Medicare Other | Attending: Oncology

## 2017-08-13 DIAGNOSIS — C61 Malignant neoplasm of prostate: Secondary | ICD-10-CM | POA: Diagnosis present

## 2017-08-13 DIAGNOSIS — C7951 Secondary malignant neoplasm of bone: Secondary | ICD-10-CM | POA: Insufficient documentation

## 2017-08-13 DIAGNOSIS — I10 Essential (primary) hypertension: Secondary | ICD-10-CM | POA: Diagnosis not present

## 2017-08-13 DIAGNOSIS — E039 Hypothyroidism, unspecified: Secondary | ICD-10-CM | POA: Diagnosis not present

## 2017-08-13 LAB — COMPREHENSIVE METABOLIC PANEL
ALBUMIN: 3.7 g/dL (ref 3.5–5.0)
ALT: 11 U/L (ref 0–55)
AST: 14 U/L (ref 5–34)
Alkaline Phosphatase: 117 U/L (ref 40–150)
Anion gap: 10 (ref 3–11)
BUN: 18 mg/dL (ref 7–26)
CHLORIDE: 105 mmol/L (ref 98–109)
CO2: 25 mmol/L (ref 22–29)
Calcium: 9 mg/dL (ref 8.4–10.4)
Creatinine, Ser: 1.14 mg/dL (ref 0.70–1.30)
GFR calc Af Amer: >60 mL/min (ref >60)
GFR calc non Af Amer: 59 mL/min — ABNORMAL LOW (ref >60)
Glucose, Bld: 110 mg/dL (ref 70–140)
POTASSIUM: 3.4 mmol/L — AB (ref 3.5–5.1)
SODIUM: 140 mmol/L (ref 136–145)
Total Bilirubin: 0.7 mg/dL (ref 0.2–1.2)
Total Protein: 6.8 g/dL (ref 6.4–8.3)

## 2017-08-13 LAB — CBC WITH DIFFERENTIAL/PLATELET
Basophils Absolute: 0.1 10*3/uL (ref 0.0–0.1)
Basophils Relative: 1 %
EOS ABS: 0.4 10*3/uL (ref 0.0–0.5)
EOS PCT: 7 %
HCT: 41.5 % (ref 38.4–49.9)
Hemoglobin: 13.8 g/dL (ref 13.0–17.1)
LYMPHS ABS: 0.9 10*3/uL (ref 0.9–3.3)
Lymphocytes Relative: 15 %
MCH: 29.7 pg (ref 27.2–33.4)
MCHC: 33.3 g/dL (ref 32.0–36.0)
MCV: 89.2 fL (ref 79.3–98.0)
MONO ABS: 0.6 10*3/uL (ref 0.1–0.9)
MONOS PCT: 9 %
NEUTROS PCT: 68 %
Neutro Abs: 4.2 10*3/uL (ref 1.5–6.5)
PLATELETS: 271 10*3/uL (ref 140–400)
RBC: 4.65 MIL/uL (ref 4.20–5.82)
RDW: 13.5 % (ref 11.0–15.6)
WBC: 6.2 10*3/uL (ref 4.0–10.3)

## 2017-08-14 LAB — PROSTATE-SPECIFIC AG, SERUM (LABCORP): Prostate Specific Ag, Serum: 19.9 ng/mL — ABNORMAL HIGH (ref 0.0–4.0)

## 2017-08-15 ENCOUNTER — Other Ambulatory Visit: Payer: Self-pay | Admitting: Pharmacist

## 2017-08-15 ENCOUNTER — Telehealth: Payer: Self-pay | Admitting: Oncology

## 2017-08-15 ENCOUNTER — Inpatient Hospital Stay (HOSPITAL_BASED_OUTPATIENT_CLINIC_OR_DEPARTMENT_OTHER): Payer: Medicare Other | Admitting: Oncology

## 2017-08-15 VITALS — BP 154/93 | HR 69 | Temp 98.0°F | Resp 18 | Ht 74.0 in | Wt 235.2 lb

## 2017-08-15 DIAGNOSIS — C7951 Secondary malignant neoplasm of bone: Secondary | ICD-10-CM | POA: Diagnosis not present

## 2017-08-15 DIAGNOSIS — E039 Hypothyroidism, unspecified: Secondary | ICD-10-CM | POA: Diagnosis not present

## 2017-08-15 DIAGNOSIS — L821 Other seborrheic keratosis: Secondary | ICD-10-CM | POA: Diagnosis not present

## 2017-08-15 DIAGNOSIS — D692 Other nonthrombocytopenic purpura: Secondary | ICD-10-CM | POA: Diagnosis not present

## 2017-08-15 DIAGNOSIS — C61 Malignant neoplasm of prostate: Secondary | ICD-10-CM | POA: Diagnosis not present

## 2017-08-15 DIAGNOSIS — L910 Hypertrophic scar: Secondary | ICD-10-CM | POA: Diagnosis not present

## 2017-08-15 DIAGNOSIS — I10 Essential (primary) hypertension: Secondary | ICD-10-CM | POA: Diagnosis not present

## 2017-08-15 DIAGNOSIS — D3612 Benign neoplasm of peripheral nerves and autonomic nervous system, upper limb, including shoulder: Secondary | ICD-10-CM | POA: Diagnosis not present

## 2017-08-15 NOTE — Telephone Encounter (Signed)
Scheduled appt per 1/24 los - Gave patient AVS and calender per los.  

## 2017-08-15 NOTE — Progress Notes (Signed)
Hematology and Oncology Follow Up Visit  Edward Bailey [id number] 16-Jun-1936 82 y.o. 08/15/2017 10:18 AM Edward Bailey, MDGriffin, Edward Reichmann, MD   Principle Diagnosis: 82 year old gentleman with castration resistant metastatic prostate cancer with disease to the bone.  He was initially diagnosed with Gleason score 3+4 equals 7 and a PSA of around 4 in 1999.   Prior Therapy:  He is status post prostatectomy and found to have stage T3b disease. He received salvage radiation therapy for a rise in his PSA.  He developed biochemical relapse and was treated with androgen depravation intermittently between 2010 to about 2015. Marland Kitchen He developed castration resistant disease in 2015.  Ketoconazole at 200 mg twice a day and prednisone 5 mg daily. Therapy discontinued on 05/24/2016 because of progression of disease.  Current therapy: Zytiga 1000 mg daily with prednisone at 5 mg daily started in November 2017.  Interim History:  Edward Bailey presents here for a follow-up visit by himself.  He reports his energy level has improved and his fatigue continues to improve since last visit.  His Synthroid dose has been adjusted which has helped with his activity level and energy.  He has also been diagnosed with sleep apnea and treating that have helped his symptoms.  He continues to take Zytiga without any complications.  He denies any nausea, edema or bone pain.  His blood pressure has been fluctuating slightly elevated at times.  His appetite remain excellent performance status is unchanged.  He denies any pelvic pain or back pain.   He does not report any headaches, blurred vision or syncope. He does not report any fevers, chills or sweats. He does not report any chest pain, shortness of breath, leg edema or palpitation. He does not report any wheezing, cough or hemoptysis.  He does not report any constipation, diarrhea or hematochezia.  He does not report any frequency urgency or hesitancy.  He does not report any  skeletal complaints. He is not report any lymphadenopathy or petechiae.  He does not report any skin rashes or lesions.  He does not report any heat or cold intolerance.  He does not report any depression or anxiety.  The rest of his review of systems is negative.      Medications: I have reviewed the patient's current medications.  Current Outpatient Medications  Medication Sig Dispense Refill  . aspirin (ASPIRIN EC) 81 MG EC tablet Take 81 mg by mouth daily. Swallow whole.    Marland Kitchen azelastine (ASTELIN) 0.1 % nasal spray Place 1-2 sprays into both nostrils daily as needed. allergies  5  . cetirizine (ZYRTEC) 10 MG tablet Take 10 mg by mouth daily as needed for allergies. As needed    . fluticasone (FLONASE) 50 MCG/ACT nasal spray Place 1 spray into both nostrils daily as needed. Allergies  5  . levothyroxine (SYNTHROID, LEVOTHROID) 112 MCG tablet TK 1 T PO QD IN THE MORNING OES  12  . levothyroxine (SYNTHROID, LEVOTHROID) 125 MCG tablet Alternates with 112 mcg  9  . Multiple Vitamins-Minerals (MULTIVITAMIN PO) Take 1 capsule by mouth daily.    . predniSONE (DELTASONE) 5 MG tablet TAKE 1 TABLET BY MOUTH ONCE DAILY WITH BREAKFAST 90 tablet 0  . ZYTIGA 250 MG tablet TAKE 4 TABLETS BY MOUTH DAILY. TAKE ON AN EMPTY STOMACH 1 HOUR BEFORE OR 2 HOURS AFTER A MEAL 120 tablet 1   No current facility-administered medications for this visit.      Allergies:  Allergies  Allergen Reactions  .  Amoxicillin Rash    Has patient had a PCN reaction causing immediate rash, facial/tongue/throat swelling, SOB or lightheadedness with hypotension: No Has patient had a PCN reaction causing severe rash involving mucus membranes or skin necrosis: No Has patient had a PCN reaction that required hospitalization No Has patient had a PCN reaction occurring within the last 10 years: Yes If all of the above answers are "NO", then may proceed with Cephalosporin use.     Past Medical History, Surgical history, Social  history, and Family History were reviewed and updated.   Physical Exam: Blood pressure (!) 154/93, pulse 69, temperature 98 F (36.7 C), temperature source Oral, resp. rate 18, height 6\' 2"  (1.88 m), weight 235 lb 3.2 oz (106.7 kg), SpO2 100 %.   ECOG: 0 General appearance: Alert, awake gentleman without distress. Head: Normocephalic, without obvious abnormality  Oropharynx: No oral ulcers or lesion.  Ptosis moist and pink. Eyes: No scleral icterus. Lymph nodes: Cervical, supraclavicular, and axillary nodes normal. Heart:regular rate and rhythm, S1, S2 normal, no murmur, click, rub or gallop Lung:chest clear to auscultation in all lung fields., no wheezing, rales, normal symmetric air entry.  Abdomin: soft, non-tender, without masses or organomegaly.  Musculoskeletal: No joint deformity or effusion. Skin: No ecchymosis or erythema. Neurological: Without any deficits.  Lab Results: Lab Results  Component Value Date   WBC 6.2 08/13/2017   HGB 13.8 08/13/2017   HCT 41.5 08/13/2017   MCV 89.2 08/13/2017   PLT 271 08/13/2017     Chemistry      Component Value Date/Time   NA 140 08/13/2017 1058   NA 139 06/07/2017 1057   K 3.4 (L) 08/13/2017 1058   K 3.6 06/07/2017 1057   CL 105 08/13/2017 1058   CO2 25 08/13/2017 1058   CO2 26 06/07/2017 1057   BUN 18 08/13/2017 1058   BUN 18.5 06/07/2017 1057   CREATININE 1.14 08/13/2017 1058   CREATININE 1.2 06/07/2017 1057      Component Value Date/Time   CALCIUM 9.0 08/13/2017 1058   CALCIUM 9.0 06/07/2017 1057   ALKPHOS 117 08/13/2017 1058   ALKPHOS 112 06/07/2017 1057   AST 14 08/13/2017 1058   AST 14 06/07/2017 1057   ALT 11 08/13/2017 1058   ALT 12 06/07/2017 1057   BILITOT 0.7 08/13/2017 1058   BILITOT 0.66 06/07/2017 1057     Results for Edward Bailey, Edward Bailey (MRN [id number]) as of 08/15/2017 10:03  Ref. Range 06/07/2017 10:57 08/13/2017 10:57  Prostate Specific Ag, Serum Latest Ref Range: 0.0 - 4.0 ng/mL 15.1 (H) 19.9 (H)      Impression and Plan:  82 year old gentleman with the following issues:   1.  Castration resistant metastatic prostate cancer.  He was initially diagnosed in 1999 with a Gleason score 3+4 equals 7 and a PSA of around 4.   He developed focal area of metastasis on his bone scan in the right hip without any widespread disease.  His bone scan from July 2018 was personally reviewed again and discussed with the patient.  He is currently on Zytiga started November 2017 With an initial response to PSA with a drop to 6.7.  His most recent PSA is 19.9 with a doubling time of close to 6 months.  The natural course of prostate cancer especially castration resistant disease was reviewed again with treatment options were discussed.  Given the rise in his PSA it is reasonable to consider that he is developing resistant to [facility] and potentially will require  different therapy.  These options would include second line hormone therapy with [name], systemic chemotherapy, Xofigo among others.  I do not think his response to [name] will be robust given his previous exposure to both ketoconazole and Zytiga.  The rationale for systemic chemotherapy was also discussed today in detail.  Implication of starting chemotherapy as well as indication was reviewed.  Complications that include nausea, vomiting, myelosuppression, neutropenia among others.  The plan today is to continue with Zytiga and repeat staging workup including bone scan and CT scan and depending on these results we will determine the next course of action whether to continue Zytiga or switch to [facility] or potentially chemotherapy.  2. Androgen depravation: He is currently receiving under the care of Dr. Diona Fanti.  Recommend is to continue indefinitely.  3. Liver function surveillance: These were personally reviewed in January 2019 and remain within normal range.  4. Hyperthyroidism: Continues to be on thyroid supplement with improvement in his  overall fatigue and tiredness.  5.  Hypertension: We will continue to monitor his blood pressure on Zytiga and will consider starting antihypertensive medication of his blood pressure ovation persists I also recommended he continues to monitor his blood pressure outside of doctor's visits.  6. Followup: In 8 weeks.   25  minutes was spent with the patient face-to-face today.  More than 50% of time was dedicated to patient counseling, education and coordination his multifaceted care.    Zola Button, MD 1/24/201910:18 AM

## 2017-08-19 ENCOUNTER — Encounter: Payer: Self-pay | Admitting: Oncology

## 2017-08-27 MED FILL — ZYTIGA 250 MG TABLET: 250 | 30 days supply | Qty: 120 | Fill #1

## 2017-08-27 MED FILL — predniSONE 5 MG TABS: 5 | 90 days supply | Qty: 90 | Fill #0

## 2017-08-28 ENCOUNTER — Other Ambulatory Visit: Payer: Self-pay | Admitting: *Deleted

## 2017-08-28 ENCOUNTER — Encounter: Payer: Self-pay | Admitting: Oncology

## 2017-08-28 DIAGNOSIS — C61 Malignant neoplasm of prostate: Secondary | ICD-10-CM

## 2017-09-10 ENCOUNTER — Encounter (HOSPITAL_COMMUNITY)
Admission: RE | Admit: 2017-09-10 | Discharge: 2017-09-10 | Disposition: A | Discharge status: Home / Self Care | Payer: Medicare Other | Source: Ambulatory Visit | Attending: Oncology | Admitting: Oncology

## 2017-09-10 ENCOUNTER — Ambulatory Visit (HOSPITAL_COMMUNITY)
Admission: RE | Admit: 2017-09-10 | Discharge: 2017-09-10 | Disposition: A | Discharge status: Home / Self Care | Payer: Medicare Other | Source: Ambulatory Visit | Attending: Oncology | Admitting: Oncology

## 2017-09-10 ENCOUNTER — Encounter (HOSPITAL_COMMUNITY): Payer: Self-pay

## 2017-09-10 DIAGNOSIS — C7951 Secondary malignant neoplasm of bone: Secondary | ICD-10-CM | POA: Diagnosis not present

## 2017-09-10 DIAGNOSIS — M899 Disorder of bone, unspecified: Secondary | ICD-10-CM | POA: Insufficient documentation

## 2017-09-10 DIAGNOSIS — C61 Malignant neoplasm of prostate: Secondary | ICD-10-CM | POA: Diagnosis not present

## 2017-09-10 DIAGNOSIS — Z9079 Acquired absence of other genital organ(s): Secondary | ICD-10-CM | POA: Diagnosis not present

## 2017-09-10 MED ORDER — TECHNETIUM TC 99M MEDRONATE IV KIT
25.0000 | PACK | Freq: Once | INTRAVENOUS | Status: AC | PRN
Start: 2017-09-10 — End: 2017-09-10
  Administered 2017-09-10: 21 via INTRAVENOUS

## 2017-09-10 MED ORDER — IOPAMIDOL (ISOVUE-300) INJECTION 61%
INTRAVENOUS | Status: AC
  Filled 2017-09-10: qty 100

## 2017-09-10 MED ORDER — IOPAMIDOL (ISOVUE-300) INJECTION 61%
100.0000 mL | Freq: Once | INTRAVENOUS | Status: AC | PRN
  Administered 2017-09-10: 100 mL via INTRAVENOUS

## 2017-09-24 ENCOUNTER — Other Ambulatory Visit: Payer: Self-pay | Admitting: Oncology

## 2017-10-02 MED FILL — ZYTIGA 250 MG TABLET: 250 | 30 days supply | Qty: 120 | Fill #0

## 2017-10-03 ENCOUNTER — Encounter: Payer: Self-pay | Admitting: *Deleted

## 2017-10-04 ENCOUNTER — Inpatient Hospital Stay: Payer: Medicare Other | Attending: Oncology

## 2017-10-04 DIAGNOSIS — E0590 Thyrotoxicosis, unspecified without thyrotoxic crisis or storm: Secondary | ICD-10-CM | POA: Insufficient documentation

## 2017-10-04 DIAGNOSIS — I10 Essential (primary) hypertension: Secondary | ICD-10-CM | POA: Diagnosis not present

## 2017-10-04 DIAGNOSIS — C7951 Secondary malignant neoplasm of bone: Secondary | ICD-10-CM | POA: Diagnosis not present

## 2017-10-04 DIAGNOSIS — C61 Malignant neoplasm of prostate: Secondary | ICD-10-CM | POA: Insufficient documentation

## 2017-10-04 LAB — COMPREHENSIVE METABOLIC PANEL
ALBUMIN: 3.5 g/dL (ref 3.5–5.0)
ALK PHOS: 126 U/L (ref 40–150)
ALT: 10 U/L (ref 0–55)
AST: 14 U/L (ref 5–34)
Anion gap: 7 (ref 3–11)
BUN: 21 mg/dL (ref 7–26)
CALCIUM: 9.1 mg/dL (ref 8.4–10.4)
CHLORIDE: 106 mmol/L (ref 98–109)
CO2: 27 mmol/L (ref 22–29)
CREATININE: 1.03 mg/dL (ref 0.70–1.30)
GFR calc non Af Amer: >60 mL/min (ref >60)
Glucose, Bld: 92 mg/dL (ref 70–140)
Potassium: 3.6 mmol/L (ref 3.5–5.1)
SODIUM: 140 mmol/L (ref 136–145)
TOTAL PROTEIN: 6.5 g/dL (ref 6.4–8.3)
Total Bilirubin: 0.6 mg/dL (ref 0.2–1.2)

## 2017-10-04 LAB — CBC WITH DIFFERENTIAL/PLATELET
BASOS ABS: 0.1 10*3/uL (ref 0.0–0.1)
BASOS PCT: 1 %
Eosinophils Absolute: 0.5 10*3/uL (ref 0.0–0.5)
Eosinophils Relative: 7 %
HCT: 39.6 % (ref 38.4–49.9)
HEMOGLOBIN: 13.2 g/dL (ref 13.0–17.1)
Lymphocytes Relative: 18 %
Lymphs Abs: 1.1 10*3/uL (ref 0.9–3.3)
MCH: 29.8 pg (ref 27.2–33.4)
MCHC: 33.5 g/dL (ref 32.0–36.0)
MCV: 89 fL (ref 79.3–98.0)
Monocytes Absolute: 0.6 10*3/uL (ref 0.1–0.9)
Monocytes Relative: 10 %
NEUTROS PCT: 64 %
Neutro Abs: 4 10*3/uL (ref 1.5–6.5)
PLATELETS: 253 10*3/uL (ref 140–400)
RBC: 4.44 MIL/uL (ref 4.20–5.82)
RDW: 13.6 % (ref 11.0–14.6)
WBC: 6.2 10*3/uL (ref 4.0–10.3)

## 2017-10-05 LAB — PROSTATE-SPECIFIC AG, SERUM (LABCORP): PROSTATE SPECIFIC AG, SERUM: 30.7 ng/mL — AB (ref 0.0–4.0)

## 2017-10-08 ENCOUNTER — Telehealth: Payer: Self-pay | Admitting: Pharmacist

## 2017-10-08 ENCOUNTER — Telehealth: Payer: Self-pay | Admitting: Pharmacy Technician

## 2017-10-08 ENCOUNTER — Inpatient Hospital Stay (HOSPITAL_BASED_OUTPATIENT_CLINIC_OR_DEPARTMENT_OTHER): Payer: Medicare Other | Admitting: Oncology

## 2017-10-08 ENCOUNTER — Telehealth: Payer: Self-pay

## 2017-10-08 VITALS — BP 149/90 | HR 69 | Temp 98.1°F | Resp 24 | Ht 74.0 in | Wt 230.6 lb

## 2017-10-08 DIAGNOSIS — C61 Malignant neoplasm of prostate: Secondary | ICD-10-CM | POA: Diagnosis not present

## 2017-10-08 DIAGNOSIS — C7951 Secondary malignant neoplasm of bone: Secondary | ICD-10-CM | POA: Diagnosis not present

## 2017-10-08 DIAGNOSIS — I10 Essential (primary) hypertension: Secondary | ICD-10-CM | POA: Diagnosis not present

## 2017-10-08 DIAGNOSIS — E0590 Thyrotoxicosis, unspecified without thyrotoxic crisis or storm: Secondary | ICD-10-CM | POA: Diagnosis not present

## 2017-10-08 MED ORDER — ENZALUTAMIDE 40 MG PO CAPS: 160.0000 mg | ORAL_CAPSULE | Freq: Every day | ORAL | 0 refills | Status: DC

## 2017-10-08 NOTE — Telephone Encounter (Signed)
Oral Chemotherapy Pharmacist Encounter   I spoke with patient for overview of: Xtandi.   Counseled patient on administration, dosing, side effects, monitoring, drug-food interactions, safe handling, storage, and disposal.  Patient will take Xtandi 40mg  capsules, 4 capsules (160mg ) by mouth once daily without regard to food. Xtandi start date: TBD, pending medication acquisition  Side effects include but not limited to: peripheral edema, GI upset, hypertension, hot flashes, fatigue, falls/fractures, and arthralgias.   Patient instructed about small risk of seizures with Xtandi treatment.  Reviewed with patient importance of keeping a medication schedule and plan for any missed doses.  Edward Bailey voiced understanding and appreciation.   All questions answered. Medication reconciliation performed and medication/allergy list updated.  Patient understands that the last day of Edward Bailey will also be his last day of prednisone. Patient will continue Zytiga until [facility] acquisition per MD direction.  Oral oncology clinic will follow up with patient after insurance authorization is obtained to discuss copayment.  He had been receiving his Zytiga from the Edward Bailey and is very pleased with their service.  He is happy to continue using this pharmacy for his Xtandi.  Patient knows to call the office with questions or concerns.  Oral Oncology Clinic will continue to follow.  Thank you,  Edward Bailey, PharmD, BCPS, BCOP 10/08/2017   12:01 PM Oral Oncology Clinic [phone]

## 2017-10-08 NOTE — Progress Notes (Signed)
Hematology and Oncology Follow Up Visit  Edward Bailey [id number] [birth year] 82 y.o. 10/08/2017 9:00 AM [name], MDGriffin, [name], MD   Principle Diagnosis: 82 year old man with castration resistant metastatic prostate diagnosed initially in 1999.  He had a Gleason score 3+4 = 7 and a PSA of 4.  He developed metastatic disease to the bone and castration resistance in 2015.     Prior Therapy:  He is status post prostatectomy and found to have stage T3b disease. He received salvage radiation therapy for a rise in his PSA.  He developed biochemical relapse and was treated with androgen depravation intermittently between 2010 to about 2015.  He developed castration resistant disease in 2015.  Ketoconazole at 200 mg twice a day and prednisone 5 mg daily. Therapy discontinued on 05/24/2016 because of progression of disease.  Current therapy: Zytiga 1000 mg daily with prednisone at 5 mg daily started in November 2017.  Interim History:  [name] is here for a follow-up visit.  He reports feeling well without any recent complaints.  His thyroid medication have been adjusted and his sleep apnea has been treated which have helped his symptoms of fatigue, tiredness and dyspnea on exertion.  He has resumed most activities of daily living.  He denies any complications related to Zytiga including fatigue, tiredness or edema.  His performance status and activity level remain excellent.  He denies any bone pain, back pain or pathological fractures.   He does not report any headaches, blurred vision or syncope. He does not report any fevers, chills or sweats.  Appetite remain excellent and weight is stable.  He does not report any chest pain, shortness of breath, leg edema or palpitation. He does not report any wheezing, cough or hemoptysis.  He does not report any constipation, diarrhea or hematochezia.  He does not report any frequency urgency or hesitancy.  He does not report any skeletal  complaints. He is not report any lymphadenopathy or petechiae.  He does not report any skin rashes or lesions.  He does not report any heat or cold intolerance.  He does not report any depression or anxiety.  The rest of his review of systems is negative.      Medications: I have reviewed the patient's current medications.  Current Outpatient Medications  Medication Sig Dispense Refill  . aspirin (ASPIRIN EC) 81 MG EC tablet Take 81 mg by mouth daily. Swallow whole.    Marland Kitchen azelastine (ASTELIN) 0.1 % nasal spray Place 1-2 sprays into both nostrils daily as needed. allergies  5  . cetirizine (ZYRTEC) 10 MG tablet Take 10 mg by mouth daily as needed for allergies. As needed    . fluticasone (FLONASE) 50 MCG/ACT nasal spray Place 1 spray into both nostrils daily as needed. Allergies  5  . levothyroxine (SYNTHROID, LEVOTHROID) 112 MCG tablet TK 1 T PO QD IN THE MORNING OES  12  . levothyroxine (SYNTHROID, LEVOTHROID) 125 MCG tablet Alternates with 112 mcg  9  . Multiple Vitamins-Minerals (MULTIVITAMIN PO) Take 1 capsule by mouth daily.    . predniSONE (DELTASONE) 5 MG tablet TAKE 1 TABLET BY MOUTH ONCE DAILY WITH BREAKFAST 90 tablet 0  . ZYTIGA 250 MG tablet TAKE 4 TABLETS BY MOUTH DAILY. TAKE ON AN EMPTY STOMACH 1 HOUR BEFORE OR 2 HOURS AFTER A MEAL 120 tablet 1   No current facility-administered medications for this visit.      Allergies:  Allergies  Allergen Reactions  . Amoxicillin Rash  Has patient had a PCN reaction causing immediate rash, facial/tongue/throat swelling, SOB or lightheadedness with hypotension: No Has patient had a PCN reaction causing severe rash involving mucus membranes or skin necrosis: No Has patient had a PCN reaction that required hospitalization No Has patient had a PCN reaction occurring within the last 10 years: Yes If all of the above answers are "NO", then may proceed with Cephalosporin use.     Past Medical History, Surgical history, Social history, and  Family History were reviewed and updated.   Physical Exam:  Blood pressure (!) 149/90, pulse 69, temperature 98.1 F (36.7 C), temperature source Oral, resp. rate (!) 24, height 6\' 2"  (1.88 m), weight 230 lb 9.6 oz (104.6 kg), SpO2 98 %.   ECOG: 0 General appearance: Alert, awake gentleman without distress. Head: Atraumatic without abnormalities. Oropharynx: Mucous membranes are moist and pink without ulcers. Eyes: Pupils are equal and round reactive to light. Lymph nodes: No lymphadenopathy palpated. Heart: Regular rate and rhythm without murmurs rubs or gallops. Lung: clear to auscultation without any rhonchi, wheezes or dullness to percussion. Abdomin: Soft, nontender without any rebound or guarding. Musculoskeletal: Full range of motion noted all extremities. Skin: No rashes or lesions. Neurological: No motor or sensory deficits.  Lab Results: Lab Results  Component Value Date   WBC 6.2 10/04/2017   HGB 13.2 10/04/2017   HCT 39.6 10/04/2017   MCV 89.0 10/04/2017   PLT 253 10/04/2017     Chemistry      Component Value Date/Time   NA 140 10/04/2017 1019   NA 139 06/07/2017 1057   K 3.6 10/04/2017 1019   K 3.6 06/07/2017 1057   CL 106 10/04/2017 1019   CO2 27 10/04/2017 1019   CO2 26 06/07/2017 1057   BUN 21 10/04/2017 1019   BUN 18.5 06/07/2017 1057   CREATININE 1.03 10/04/2017 1019   CREATININE 1.2 06/07/2017 1057      Component Value Date/Time   CALCIUM 9.1 10/04/2017 1019   CALCIUM 9.0 06/07/2017 1057   ALKPHOS 126 10/04/2017 1019   ALKPHOS 112 06/07/2017 1057   AST 14 10/04/2017 1019   AST 14 06/07/2017 1057   ALT 10 10/04/2017 1019   ALT 12 06/07/2017 1057   BILITOT 0.6 10/04/2017 1019   BILITOT 0.66 06/07/2017 1057     Results for Edward, Bailey (MRN [id number]) as of 10/08/2017 09:00  Ref. Range 06/07/2017 10:57 08/13/2017 10:57 10/04/2017 10:20  Prostate Specific Ag, Serum Latest Ref Range: 0.0 - 4.0 ng/mL 15.1 (H) 19.9 (H) 30.7 (H)    EXAM: CT ABDOMEN AND PELVIS WITH CONTRAST  TECHNIQUE: Multidetector CT imaging of the abdomen and pelvis was performed using the standard protocol following bolus administration of intravenous contrast.  CONTRAST:  141mL ISOVUE-300 IOPAMIDOL (ISOVUE-300) INJECTION 61%  COMPARISON:  CT abdomen/pelvis dated 12/12/2015. Concurrent whole-body bone scan dated 09/10/2017.  FINDINGS: Lower chest: Lung bases are clear.  Hepatobiliary: Liver is within normal limits.  Gallbladder is unremarkable. No intrahepatic or extrahepatic ductal dilatation.  Pancreas: Within normal limits.  Spleen: Within normal limits.  Adrenals/Urinary Tract: Adrenal glands are within normal limits.  Kidneys are within normal limits.  No hydronephrosis.  Bladder is low-lying but otherwise unremarkable.  Stomach/Bowel: Stomach is within normal limits.  No evidence of bowel obstruction.  Normal appendix (series 2/image 60).  Vascular/Lymphatic: No evidence of abdominal aortic aneurysm.  Atherosclerotic calcifications of the abdominal aorta and branch vessels.  Status post pelvic lymphadenectomy.  Reproductive: Status post prostatectomy.  Other:  No abdominopelvic ascites.  Fat within the bilateral inguinal canals.  Musculoskeletal: Degenerative changes of the visualized thoracolumbar spine.  Progressive multifocal sclerotic osseous metastases, involving the sternum, T9, T11, and the bilateral pelvis (including the left sacrum and right acetabulum).  IMPRESSION: Progressive multifocal osseous metastases, including dominant lesions involving the T9 vertebral body and left sacrum.  Status post prostatectomy with pelvic lymph node dissection.     Impression and Plan:  82 year old gentleman with the following issues:   1.  Castration resistant metastatic prostate cancer with disease to the bone.  He is currently on Zytiga since 2017 and his PSA is rising.  His  PSA on October 04, 2017 was 30.7 which has doubled in less than 6 months.  The natural course of this disease was reviewed again with the patient as well as treatment options.  His imaging studies from February 2019 were reviewed  which showed metastatic disease to the bone without any evidence of visceral metastasis.  Treatment options were discussed today which includes different salvage therapy moving away from [place].  These options would include Xtandi, systemic chemotherapy and Xofigo.  Given his bone disease only it would be reasonable to consider Xofigo at this time.  Risks and benefits of all these options were discussed today including the rationale for using systemic chemotherapy as well.  After discussion today, he would like to defer systemic chemotherapy for a later date.  Although Xtandi would be reasonable to try the success likelihood is low given his previous exposure to ketoconazole and Zytiga.  Complication associated with this medication was reviewed today and he is willing to try it at this time.  Side effects that include fatigue, edema, hypertension, hematuria and rarely seizures.  In the meantime, we will also make the appropriate referrals to start Xofigo as well which should be helpful to treat his disease in the bone.  He will started Xtandi in the immediate future while he is under consideration to start [place].  2. Androgen depravation: I recommended continuing this treatment indefinitely.  He is currently receiving Lupron under the care of Dr. Diona Fanti.  3. Liver function surveillance: Liver function tests appear within normal range.  4. Hyperthyroidism: improved at this time with appropriate Synthroid supplement.  5.  Hypertension: Close to normal range between clinic visits.  6. Followup: In 6 weeks.   25  minutes was spent with the patient face-to-face today.  More than 50% of time was dedicated to patient counseling, education and discussing treatment options  and plans of care.    Zola Button, MD 3/19/20199:00 AM

## 2017-10-08 NOTE — Telephone Encounter (Signed)
Printed avs and calender of upcoming appointments. per 3/15 los

## 2017-10-08 NOTE — Telephone Encounter (Signed)
Oral Oncology Pharmacist Encounter  Received new prescription for Xtandi (enzalutamide) for the treatment of metastatic, catsration-resistant prostate cancer in conjunction with ADT, planned duration until disease progression or unacceptable toxicity. Patient with recent progression of disease while on therapy with Zytiga and prednisone.  Since  Labs from 10/04/17 assessed, okay for treatment.  Current medication list in Epic reviewed, no DDIs with Xtandi identified. Patient will be counseled to discontinue use of prednisone when he discontinues use of Zytiga.  Prescription has been e-scribed to the [facility] for benefits analysis and approval. Prior authorization is required and has been submitted.  Oral Oncology Clinic will continue to follow for insurance authorization, copayment issues, initial counseling and start date.  Johny Drilling, PharmD, BCPS, BCOP 10/08/2017 11:56 AM Oral Oncology Clinic [phone]

## 2017-10-08 NOTE — Telephone Encounter (Signed)
Oral Oncology Patient Advocate Encounter  Received notification from [facility] that prior authorization for [name] is required.  PA submitted on CoverMyMeds Key [id number] Status is pending  Oral Oncology Clinic will continue to follow.  Fabio Asa. Melynda Keller, [place] [facility] [place] [phone] 10/08/2017 10:56 AM

## 2017-10-09 ENCOUNTER — Encounter: Payer: Self-pay | Admitting: Radiation Oncology

## 2017-10-10 MED FILL — XTANDI 40 MG CAPSULE: 40 | 30 days supply | Qty: 120 | Fill #0

## 2017-10-17 DIAGNOSIS — R030 Elevated blood-pressure reading, without diagnosis of hypertension: Secondary | ICD-10-CM | POA: Diagnosis not present

## 2017-10-17 DIAGNOSIS — Z0000 Encounter for general adult medical examination without abnormal findings: Secondary | ICD-10-CM | POA: Diagnosis not present

## 2017-10-17 DIAGNOSIS — C61 Malignant neoplasm of prostate: Secondary | ICD-10-CM | POA: Diagnosis not present

## 2017-10-17 DIAGNOSIS — Z1389 Encounter for screening for other disorder: Secondary | ICD-10-CM | POA: Diagnosis not present

## 2017-10-18 DIAGNOSIS — C7951 Secondary malignant neoplasm of bone: Secondary | ICD-10-CM | POA: Diagnosis not present

## 2017-10-18 DIAGNOSIS — C61 Malignant neoplasm of prostate: Secondary | ICD-10-CM | POA: Diagnosis not present

## 2017-10-31 ENCOUNTER — Other Ambulatory Visit: Payer: Self-pay | Admitting: Oncology

## 2017-10-31 DIAGNOSIS — C61 Malignant neoplasm of prostate: Secondary | ICD-10-CM

## 2017-11-04 MED FILL — XTANDI 40 MG CAPSULE: 40 | 30 days supply | Qty: 120 | Fill #0

## 2017-11-05 ENCOUNTER — Encounter: Payer: Self-pay | Admitting: Radiation Oncology

## 2017-11-05 NOTE — Progress Notes (Addendum)
Histology and Location of Primary Cancer: castration resistant metastatic prostate diagnosed initially in 1999.  He had a Gleason score 3+4 = 7 and a PSA of 4.  He developed metastatic disease to the bone and castration resistance in 2015.    Sites of Visceral and Bony Metastatic Disease: Left sacrum, right ilium/acetabulum, T9 and T10, upper sternum and possibly distal left femur and proximal right tibia  Location(s) of Symptomatic Metastases: PSA 10/04/2017 was 30.7 which is doubled in the last six months despite Zytiga  Past/Anticipated chemotherapy by medical oncology, if any:  Prior Therapy:  He is status post prostatectomy and found to have stage T3b disease. He received salvage radiation therapy for a rise in his PSA.  He developed biochemical relapse and was treated with androgen depravation intermittently between 2010 to about 2015.  He developed castration resistant disease in 2015.  Ketoconazole at 200 mg twice a day and prednisone 5 mg daily. Therapy discontinued on 05/24/2016 because of progression of disease. Zytiga 1000 mg daily with prednisone at 5 mg daily started in November 2017.  Current Therapy: Patient has decided to defer systemic chemotherapy for a later date. Patient decided to start Xtandi (started 3 weeks ago) while under consideration for Xofigo. Also, he will continue to receive Lupron from Dr. Diona Fanti.   Pain on a scale of 0-10 is: 0    If Spine Met(s), symptoms, if any, include:  Bowel/Bladder retention or incontinence (please describe): no  Numbness or weakness in extremities (please describe): no  Current Decadron regimen, if applicable: no  Ambulatory status? Walker? Wheelchair?: Ambulatory  SAFETY ISSUES:  Prior radiation? yes, prostatic foss 6300 cGy August 2002 with [facility]  Pacemaker/ICD? no  Possible current pregnancy? no  Is the patient on methotrexate? no  Current Complaints / other details:  82 year old male. Referred for  consideration of Xofigo. BP elevated. PCP, Dr. Laurann Montana, started him on BP medication approximately 2 weeks ago. Patient quickly bp at home daily.

## 2017-11-06 ENCOUNTER — Encounter: Payer: Self-pay | Admitting: Radiation Oncology

## 2017-11-06 ENCOUNTER — Ambulatory Visit
Admission: RE | Admit: 2017-11-06 | Discharge: 2017-11-06 | Disposition: A | Discharge status: Home / Self Care | Payer: Medicare Other | Source: Ambulatory Visit | Attending: Radiation Oncology | Admitting: Radiation Oncology

## 2017-11-06 ENCOUNTER — Other Ambulatory Visit: Payer: Self-pay

## 2017-11-06 DIAGNOSIS — C7952 Secondary malignant neoplasm of bone marrow: Principal | ICD-10-CM

## 2017-11-06 DIAGNOSIS — Z923 Personal history of irradiation: Secondary | ICD-10-CM | POA: Diagnosis not present

## 2017-11-06 DIAGNOSIS — C7982 Secondary malignant neoplasm of genital organs: Secondary | ICD-10-CM | POA: Insufficient documentation

## 2017-11-06 DIAGNOSIS — C7951 Secondary malignant neoplasm of bone: Secondary | ICD-10-CM

## 2017-11-06 DIAGNOSIS — Z8042 Family history of malignant neoplasm of prostate: Secondary | ICD-10-CM | POA: Diagnosis not present

## 2017-11-06 DIAGNOSIS — C61 Malignant neoplasm of prostate: Secondary | ICD-10-CM | POA: Insufficient documentation

## 2017-11-06 NOTE — Progress Notes (Signed)
See progress note under physician encounter. 

## 2017-11-06 NOTE — Progress Notes (Signed)
Radiation Oncology         [phone] ________________________________  Initial outpatient Consultation  Name: Edward Bailey MRN: [id number]  Date: 11/06/2017  DOB: 1935/09/01  MK:[id number], [name], MD  Wyatt Portela, MD   REFERRING PHYSICIAN: Wyatt Portela, MD  DIAGNOSIS: The encounter diagnosis was Prostate cancer metastatic to bone [facility]).    ICD-10-CM   1. Prostate cancer metastatic to bone [facility]) C61    C79.51     HISTORY OF PRESENT ILLNESS: Edward Bailey is a 82 y.o. male with a diagnosis of metastatic prostate cancer. 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 He had a Gleason score 3+4=7 and a PSA of 4.  He is status post prostatectomy and found to have stage T3b disease. He received salvage radaition therapy in 2002 for a rise in his PSA. He developed biochemical relapse and was treated with androgen deprivation intermittently between 2010 to about 2015. He developed metastatic disease to the bone and castration resistant disease in 2015. He received Ketoconazole at 200 mg twice a day and prednisone 5 mg daily. This therapy was discontinued on 05/24/2016 because of progression of disease.   He was started on Zytiga 1000 mg daily with prednisone 5 mg daily in November 2017 which continued until 08/2017. Most recent bone scan on 09/10/2017 showed progressive osseous metastatic disease in the left sacrum, right ilium/acetabulum, T9 and T10, upper sternum and possibly distal left femur and proximal right tibia and a recent CT abdomen/pelvis on 09/10/2017 showed progressive multifocal osseous metastases, including dominant lesions involving the T9 vertebral body and left sacrum. He has no site specific pain at this point and really is without complaints.  He recently met with Dr. Alen Blew to discuss further treatment options and has decided to defer further systemic chemotherapy for a later date. He decided to start  Xtandi in March 2019 and will continue to receive [place] for ADT with Dr. Diona Fanti.  Depending on his response to [facility], he would also like to consider treatment with Xofigo.  He presents today, in referral from Dr. Alen Blew, for further evaluation and consideration of radiation treatment options, specifically Xofigo, in the management of his disease.  PREVIOUS RADIATION THERAPY: Yes, salvage radiotherapy to the prostatic fossa 6300 cGy in August 2002 with Dr. Danny Lawless.   PAST MEDICAL HISTORY:  Past Medical History:  Diagnosis Date  . GERD (gastroesophageal reflux disease)   . Hyperthyroidism   . Prostate cancer [facility])    prostate cancer radiation and surgery 15 years ago, Dr. Maceo Pro      PAST SURGICAL HISTORY: Past Surgical History:  Procedure Laterality Date  . COLONOSCOPY WITH PROPOFOL N/A 06/13/2015   Procedure: COLONOSCOPY WITH PROPOFOL;  Surgeon: Garlan Fair, MD;  Location: [facility] ENDOSCOPY;  Service: Endoscopy;  Laterality: N/A;  . PROSTATE SURGERY    . TONSILLECTOMY      FAMILY HISTORY:  Family History  Problem Relation Age of Onset  . Prostate cancer Brother   . Prostate cancer Brother     SOCIAL HISTORY:  Social History   Socioeconomic History  . Marital status: Married    Spouse name: Not on file  . Number of children: Not on file  . Years of education: Not on file  . Highest education level: Not on file  Occupational History  . Occupation: retired    Comment: Designer, fashion/clothing  Social Needs  . Financial resource strain: Not on file  . Food  insecurity:    Worry: Not on file    Inability: Not on file  . Transportation needs:    Medical: Not on file    Non-medical: Not on file  Tobacco Use  . Smoking status: Never Smoker  . Smokeless tobacco: Never Used  Substance and Sexual Activity  . Alcohol use: Yes    Comment: occasional  . Drug use: No  . Sexual activity: Not Currently  Lifestyle  . Physical activity:    Days per week: Not on file      Minutes per session: Not on file  . Stress: Not on file  Relationships  . Social connections:    Talks on phone: Not on file    Gets together: Not on file    Attends religious service: Not on file    Active member of club or organization: Not on file    Attends meetings of clubs or organizations: Not on file    Relationship status: Not on file  . Intimate partner violence:    Fear of current or ex partner: Not on file    Emotionally abused: Not on file    Physically abused: Not on file    Forced sexual activity: Not on file  Other Topics Concern  . Not on file  Social History Narrative   Resides in [place].    ALLERGIES: Amoxicillin  MEDICATIONS:  Current Outpatient Medications  Medication Sig Dispense Refill  . aspirin (ASPIRIN EC) 81 MG EC tablet Take 81 mg by mouth daily. Swallow whole.    Marland Kitchen azelastine (ASTELIN) 0.1 % nasal spray Place 1-2 sprays into both nostrils daily as needed. allergies  5  . cetirizine (ZYRTEC) 10 MG tablet Take 10 mg by mouth daily as needed for allergies. As needed    . fluticasone (FLONASE) 50 MCG/ACT nasal spray Place 1 spray into both nostrils daily as needed. Allergies  5  . levothyroxine (SYNTHROID, LEVOTHROID) 112 MCG tablet TK 1 T PO QD IN THE MORNING OES  12  . levothyroxine (SYNTHROID, LEVOTHROID) 125 MCG tablet Alternates with 112 mcg  9  . Multiple Vitamins-Minerals (MULTIVITAMIN PO) Take 1 capsule by mouth daily.    [name] 40 MG capsule TAKE 4 CAPSULES (160 MG TOTAL) BY MOUTH DAILY. 120 capsule 0   No current facility-administered medications for this encounter.     REVIEW OF SYSTEMS:  On review of systems, the patient reports that he is doing well overall. He denies any chest pain, shortness of breath, cough, fevers, chills, night sweats, unintended weight changes. States his pain is a 0 out of 10 on pain scale. He denies any bowel/bladder retention or incontinence. He denies any numbness or weakness in extremities. He is not  currently taking Decadron. He is ambulatory without issue. The patient's PCP, Dr. Laurann Montana, started him on BP medication approximately 2 weeks ago. A complete review of systems is obtained and is otherwise negative.    PHYSICAL EXAM:  Wt Readings from Last 3 Encounters:  11/06/17 231 lb 3.2 oz (104.9 kg)  10/08/17 230 lb 9.6 oz (104.6 kg)  08/15/17 235 lb 3.2 oz (106.7 kg)   Temp Readings from Last 3 Encounters:  11/06/17 97.9 F (36.6 C) (Oral)  10/08/17 98.1 F (36.7 C) (Oral)  08/15/17 98 F (36.7 C) (Oral)   BP Readings from Last 3 Encounters:  11/06/17 (!) 151/82  10/08/17 (!) 149/90  08/15/17 (!) 154/93   Pulse Readings from Last 3 Encounters:  11/06/17 72  10/08/17 69  08/15/17 69   Pain Assessment Pain Score: 0-No pain/10  In general this is a well appearing caucasian male in no acute distress. He is alert and oriented x4 and appropriate throughout the examination. HEENT reveals that the patient is normocephalic, atraumatic. EOMs are intact. PERRLA. Skin is intact without any evidence of gross lesions. Cardiovascular exam reveals a regular rate and rhythm, no clicks rubs or murmurs are auscultated. Chest is clear to auscultation bilaterally. Lymphatic assessment is performed and does not reveal any adenopathy in the cervical, supraclavicular, axillary, or inguinal chains. Abdomen has active bowel sounds in all quadrants and is intact. The abdomen is soft, non tender, non distended. Lower extremities are negative for pretibial pitting edema, deep calf tenderness, cyanosis or clubbing.   KPS = 100  100 - Normal; no complaints; no evidence of disease. 90   - Able to carry on normal activity; minor signs or symptoms of disease. 80   - Normal activity with effort; some signs or symptoms of disease. 51   - Cares for self; unable to carry on normal activity or to do active work. 60   - Requires occasional assistance, but is able to care for most of his personal needs. 50   -  Requires considerable assistance and frequent medical care. 23   - Disabled; requires special care and assistance. 63   - Severely disabled; hospital admission is indicated although death not imminent. 42   - Very sick; hospital admission necessary; active supportive treatment necessary. 10   - Moribund; fatal processes progressing rapidly. 0     - Dead  Karnofsky DA, Abelmann [place], Craver LS and Burchenal [facility] [phone]) The use of the nitrogen mustards in the palliative treatment of carcinoma: with particular reference to bronchogenic carcinoma Cancer 1 634-56  LABORATORY DATA:  Lab Results  Component Value Date   WBC 6.2 10/04/2017   HGB 13.2 10/04/2017   HCT 39.6 10/04/2017   MCV 89.0 10/04/2017   PLT 253 10/04/2017   Lab Results  Component Value Date   NA 140 10/04/2017   K 3.6 10/04/2017   CL 106 10/04/2017   CO2 27 10/04/2017   Lab Results  Component Value Date   ALT 10 10/04/2017   AST 14 10/04/2017   ALKPHOS 126 10/04/2017   BILITOT 0.6 10/04/2017     RADIOGRAPHY: No results found.    IMPRESSION/PLAN: 1. 82 y.o. gentleman with metastatic castration resistant prostate cancer with multiple bone metastases.  Today we reviewed the findings and workup thus far.  We discussed the natural history of metastatic prostate cancer.   Given his bone disease only, he would likely benefit from Xofigo infusions in the management of his bony metastatic disease.  We discussed radiation treatment in the management of prostate cancer with regard to the logistics and delivery of Xofigo infusion.  Expected side effects related to this treatment were discussed in detail.  The patient was encouraged to ask questions that were answered to the best of our ability and to his stated satisfaction.   At the conclusion of our conversation, the patient would like to continue with [name] for now and pending his response, would like to consider Xofigo infusion in the future if neccessary.  We will share our  findings with Dr. Alen Blew.  We enjoyed meeting him today and would be happy to continue to participate in his care should he decide to move forward with Xofigo fusions.   Nicholos Johns, PA-C    Tyler Pita,  MD  [facility] Direct Dial: [phone]  Fax: [phone] [email or web address]  Skype  [email or web address]  This document serves as a record of services personally performed by Tyler Pita, MD and Freeman [name], PA-C. It was created on their behalf by Arlyce Harman, a trained medical scribe. The creation of this record is based on the scribe's personal observations and the provider's statements to them. This document has been checked and approved by the attending provider.

## 2017-11-19 DIAGNOSIS — C61 Malignant neoplasm of prostate: Secondary | ICD-10-CM | POA: Diagnosis not present

## 2017-11-19 DIAGNOSIS — C7951 Secondary malignant neoplasm of bone: Secondary | ICD-10-CM | POA: Diagnosis not present

## 2017-11-20 ENCOUNTER — Inpatient Hospital Stay: Payer: Medicare Other | Attending: Oncology

## 2017-11-20 DIAGNOSIS — C7951 Secondary malignant neoplasm of bone: Secondary | ICD-10-CM | POA: Insufficient documentation

## 2017-11-20 DIAGNOSIS — E0590 Thyrotoxicosis, unspecified without thyrotoxic crisis or storm: Secondary | ICD-10-CM | POA: Diagnosis not present

## 2017-11-20 DIAGNOSIS — I10 Essential (primary) hypertension: Secondary | ICD-10-CM | POA: Insufficient documentation

## 2017-11-20 DIAGNOSIS — C61 Malignant neoplasm of prostate: Secondary | ICD-10-CM

## 2017-11-20 LAB — CBC WITH DIFFERENTIAL (CANCER CENTER ONLY)
BASOS PCT: 1 %
Basophils Absolute: 0 10*3/uL (ref 0.0–0.1)
Eosinophils Absolute: 0.6 10*3/uL — ABNORMAL HIGH (ref 0.0–0.5)
Eosinophils Relative: 11 %
HEMATOCRIT: 41 % (ref 38.4–49.9)
Hemoglobin: 13.7 g/dL (ref 13.0–17.1)
LYMPHS ABS: 0.8 10*3/uL — AB (ref 0.9–3.3)
Lymphocytes Relative: 15 %
MCH: 29.9 pg (ref 27.2–33.4)
MCHC: 33.4 g/dL (ref 32.0–36.0)
MCV: 89.7 fL (ref 79.3–98.0)
MONO ABS: 0.5 10*3/uL (ref 0.1–0.9)
Monocytes Relative: 9 %
NEUTROS ABS: 3.4 10*3/uL (ref 1.5–6.5)
Neutrophils Relative %: 64 %
Platelet Count: 295 10*3/uL (ref 140–400)
RBC: 4.57 MIL/uL (ref 4.20–5.82)
RDW: 14.1 % (ref 11.0–14.6)
WBC Count: 5.3 10*3/uL (ref 4.0–10.3)

## 2017-11-20 LAB — CMP (CANCER CENTER ONLY)
ALK PHOS: 122 U/L (ref 40–150)
ALT: 8 U/L (ref 0–55)
ANION GAP: 7 (ref 3–11)
AST: 13 U/L (ref 5–34)
Albumin: 3.8 g/dL (ref 3.5–5.0)
BUN: 16 mg/dL (ref 7–26)
CALCIUM: 8.7 mg/dL (ref 8.4–10.4)
CHLORIDE: 105 mmol/L (ref 98–109)
CO2: 25 mmol/L (ref 22–29)
CREATININE: 1.06 mg/dL (ref 0.70–1.30)
GFR, Est AFR Am: >60 mL/min (ref >60)
GFR, Estimated: >60 mL/min (ref >60)
GLUCOSE: 121 mg/dL (ref 70–140)
Potassium: 3.7 mmol/L (ref 3.5–5.1)
SODIUM: 137 mmol/L (ref 136–145)
Total Bilirubin: 0.5 mg/dL (ref 0.2–1.2)
Total Protein: 6.7 g/dL (ref 6.4–8.3)

## 2017-11-21 ENCOUNTER — Inpatient Hospital Stay (HOSPITAL_BASED_OUTPATIENT_CLINIC_OR_DEPARTMENT_OTHER): Payer: Medicare Other | Admitting: Oncology

## 2017-11-21 ENCOUNTER — Telehealth: Payer: Self-pay | Admitting: Oncology

## 2017-11-21 VITALS — BP 139/86 | HR 75 | Temp 97.5°F | Resp 18 | Ht 74.0 in | Wt 231.6 lb

## 2017-11-21 DIAGNOSIS — C7951 Secondary malignant neoplasm of bone: Secondary | ICD-10-CM | POA: Diagnosis not present

## 2017-11-21 DIAGNOSIS — I10 Essential (primary) hypertension: Secondary | ICD-10-CM

## 2017-11-21 DIAGNOSIS — C61 Malignant neoplasm of prostate: Secondary | ICD-10-CM

## 2017-11-21 DIAGNOSIS — E0590 Thyrotoxicosis, unspecified without thyrotoxic crisis or storm: Secondary | ICD-10-CM | POA: Diagnosis not present

## 2017-11-21 LAB — PROSTATE-SPECIFIC AG, SERUM (LABCORP): Prostate Specific Ag, Serum: 45.1 ng/mL — ABNORMAL HIGH (ref 0.0–4.0)

## 2017-11-21 NOTE — Telephone Encounter (Signed)
Appointments scheduled AVS/Calendar printed per 5/2 lso

## 2017-11-21 NOTE — Progress Notes (Signed)
Hematology and Oncology Follow Up Visit  Edward Bailey [id number] 09/18/1935 82 y.o. 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  He was initially diagnosed in 1999 with Gleason score 3+4 = 7 and a PSA of 4.   Prior Therapy:  He is status post prostatectomy and found to have stage T3b disease. He received salvage radiation therapy for a rise in his PSA.  He developed biochemical relapse and was treated with androgen depravation intermittently between 2010 to about 2015.  He developed castration resistant disease in 2015.  Ketoconazole at 200 mg twice a day and prednisone 5 mg daily. Therapy discontinued on 05/24/2016 because of progression of disease. Zytiga 1000 mg daily with prednisone at 5 mg daily started in November 2017.  Current therapy: Xtandi 160 mg daily started in April 2019.  Interim History:  Edward Bailey resents today for a follow-up visit.  Since last visit, he started taking Xtandi for the last 3 weeks without complications.  He denies any excessive fatigue, tiredness or edema.  He denies any GI complications.  He denies any bone pain or pathological fractures.  He continues to attend to activities of daily living without any decline ability to do so.  He denies any genitourinary complaints.   He does not report any headaches, blurred vision or syncope.  He denies any alteration of mental status.  He does not report any fevers, chills or sweats.  His weight is not changed.  He does not report any chest pain, shortness of breath, leg edema or palpitation. He does not report any wheezing, cough or hemoptysis.  He does not report any constipation, diarrhea or hematochezia.  He does not report any frequency urgency or hesitancy.  He does not report any skeletal complaints. He is not report any lymphadenopathy or petechiae.  He does not report any skin  rashes or lesions.  He denies any psychiatric issues.  The rest of his review of systems is negative.      Medications: I have reviewed the patient's current medications.  Current Outpatient Medications  Medication Sig Dispense Refill  . aspirin (ASPIRIN EC) 81 MG EC tablet Take 81 mg by mouth daily. Swallow whole.    Marland Kitchen azelastine (ASTELIN) 0.1 % nasal spray Place 1-2 sprays into both nostrils daily as needed. allergies  5  . cetirizine (ZYRTEC) 10 MG tablet Take 10 mg by mouth daily as needed for allergies. As needed    . fluticasone (FLONASE) 50 MCG/ACT nasal spray Place 1 spray into both nostrils daily as needed. Allergies  5  . levothyroxine (SYNTHROID, LEVOTHROID) 112 MCG tablet TK 1 T PO QD IN THE MORNING OES  12  . levothyroxine (SYNTHROID, LEVOTHROID) 125 MCG tablet Alternates with 112 mcg  9  . Multiple Vitamins-Minerals (MULTIVITAMIN PO) Take 1 capsule by mouth daily.    Edward Bailey 40 MG capsule TAKE 4 CAPSULES (160 MG TOTAL) BY MOUTH DAILY. 120 capsule 0   No current facility-administered medications for this visit.      Allergies:  Allergies  Allergen Reactions  . Amoxicillin Rash    Has patient had a PCN reaction causing immediate rash, facial/tongue/throat swelling, SOB or lightheadedness with hypotension: No Has patient had a PCN reaction causing severe rash involving mucus membranes or skin necrosis: No Has patient had a PCN reaction that required hospitalization No Has patient had a PCN reaction occurring within the  last 10 years: Yes If all of the above answers are "NO", then may proceed with Cephalosporin use.     Past Medical History, Surgical history, Social history, and Family History were reviewed and updated.   Physical Exam:  Blood pressure 139/86, pulse 75, temperature (!) 97.5 F (36.4 C), temperature source Oral, resp. rate 18, height 6\' 2"  (1.88 m), weight 231 lb 9.6 oz (105.1 kg), SpO2 100 %.   ECOG: 0 General appearance: Well-appearing gentleman  without distress. Head: Cephalic without abnormalities. Oropharynx: No oral thrush or ulcers. Eyes: Sclera anicteric. Lymph nodes: No lymphadenopathy in the cervical, axillary or supraclavicular regions. Heart: Regular without murmurs or gallops. Lung: clear without any rhonchi, wheezes or dullness to percussion. Abdomin: Soft, nontender without rebound or guarding. Musculoskeletal: No clubbing or cyanosis. Skin: No ecchymosis or petechiae. Neurological: Motor or sensory deficits.  Lab Results: Lab Results  Component Value Date   WBC 5.3 11/20/2017   HGB 13.7 11/20/2017   HCT 41.0 11/20/2017   MCV 89.7 11/20/2017   PLT 295 11/20/2017     Chemistry      Component Value Date/Time   NA 137 11/20/2017 0902   NA 139 06/07/2017 1057   K 3.7 11/20/2017 0902   K 3.6 06/07/2017 1057   CL 105 11/20/2017 0902   CO2 25 11/20/2017 0902   CO2 26 06/07/2017 1057   BUN 16 11/20/2017 0902   BUN 18.5 06/07/2017 1057   CREATININE 1.06 11/20/2017 0902   CREATININE 1.2 06/07/2017 1057      Component Value Date/Time   CALCIUM 8.7 11/20/2017 0902   CALCIUM 9.0 06/07/2017 1057   ALKPHOS 122 11/20/2017 0902   ALKPHOS 112 06/07/2017 1057   AST 13 11/20/2017 0902   AST 14 06/07/2017 1057   ALT 8 11/20/2017 0902   ALT 12 06/07/2017 1057   BILITOT 0.5 11/20/2017 0902   BILITOT 0.66 06/07/2017 1057      Results for Edward Bailey, Edward Bailey (MRN [id number]) as of 11/21/2017 11:58  Ref. Range 08/13/2017 10:57 10/04/2017 10:20 11/20/2017 09:02  Prostate Specific Ag, Serum Latest Ref Range: 0.0 - 4.0 ng/mL 19.9 (H) 30.7 (H) 45.1 (H)     Impression and Plan:  82 year old man with  1.  Castration-resistant metastatic prostate cancer to the bone.   He is status post therapy outlined above and has been on Xtandi for the last 3 weeks.  His PSA showed increase to 45 in the last 6 weeks.  He was also evaluated by Dr. Tammi Klippel for possible [name] and he is considering that option.  The natural course of  this disease was reviewed again as well as treatment options.  Given the rise in his PSA after 3 weeks of therapy it is very likely he will not respond to [place].  Alternative options would be proceeding with Xofigo or systemic chemotherapy.  After discussion today, I have recommended continuing Xtandi for another 4 to 6 weeks and if his PSA continues to rise we will consider alternative options.  Obtaining tissue biopsy for foundation medicine testing would also be an option.  2. Androgen depravation: He continues to receive that indefinitely under the care of Dr. Diona Fanti..  3.  Bone directed therapy: He started Xgeva the care of Dr. Diona Fanti.  I recommended continuing that for the time being.  4. Hyperthyroidism: Currently on Synthroid without any recent complications.  5.  Hypertension: Pressure remains adequately controlled at this time.  6. Followup: In in June 2019 for repeat PSA.  25  minutes was spent with the patient face-to-face today.  More than 50% of time was dedicated to patient counseling, education and coordinating his care.   Zola Button, MD 5/2/201912:12 PM

## 2017-11-22 ENCOUNTER — Other Ambulatory Visit: Payer: Self-pay | Admitting: Pharmacist

## 2017-11-27 ENCOUNTER — Other Ambulatory Visit: Payer: Self-pay | Admitting: Oncology

## 2017-11-27 DIAGNOSIS — C61 Malignant neoplasm of prostate: Secondary | ICD-10-CM

## 2017-11-28 DIAGNOSIS — I10 Essential (primary) hypertension: Secondary | ICD-10-CM | POA: Diagnosis not present

## 2017-12-03 MED FILL — XTANDI 40 MG CAPSULE: 40 | 30 days supply | Qty: 120 | Fill #0

## 2017-12-17 DIAGNOSIS — C7951 Secondary malignant neoplasm of bone: Secondary | ICD-10-CM | POA: Diagnosis not present

## 2017-12-17 DIAGNOSIS — C61 Malignant neoplasm of prostate: Secondary | ICD-10-CM | POA: Diagnosis not present

## 2017-12-24 ENCOUNTER — Other Ambulatory Visit: Payer: Self-pay | Admitting: Internal Medicine

## 2017-12-24 ENCOUNTER — Ambulatory Visit
Admission: RE | Admit: 2017-12-24 | Discharge: 2017-12-24 | Disposition: A | Discharge status: Home / Self Care | Payer: Medicare Other | Source: Ambulatory Visit | Attending: Internal Medicine | Admitting: Internal Medicine

## 2017-12-24 DIAGNOSIS — K5900 Constipation, unspecified: Secondary | ICD-10-CM | POA: Diagnosis not present

## 2017-12-24 DIAGNOSIS — E0590 Thyrotoxicosis, unspecified without thyrotoxic crisis or storm: Secondary | ICD-10-CM | POA: Diagnosis not present

## 2017-12-24 DIAGNOSIS — C61 Malignant neoplasm of prostate: Secondary | ICD-10-CM | POA: Diagnosis not present

## 2017-12-24 DIAGNOSIS — R109 Unspecified abdominal pain: Secondary | ICD-10-CM | POA: Diagnosis not present

## 2017-12-30 ENCOUNTER — Other Ambulatory Visit: Payer: Self-pay | Admitting: Oncology

## 2017-12-30 DIAGNOSIS — C61 Malignant neoplasm of prostate: Secondary | ICD-10-CM

## 2017-12-31 ENCOUNTER — Inpatient Hospital Stay: Payer: Medicare Other | Attending: Oncology

## 2017-12-31 DIAGNOSIS — I10 Essential (primary) hypertension: Secondary | ICD-10-CM | POA: Insufficient documentation

## 2017-12-31 DIAGNOSIS — C7951 Secondary malignant neoplasm of bone: Secondary | ICD-10-CM | POA: Diagnosis not present

## 2017-12-31 DIAGNOSIS — C61 Malignant neoplasm of prostate: Secondary | ICD-10-CM

## 2017-12-31 DIAGNOSIS — E0590 Thyrotoxicosis, unspecified without thyrotoxic crisis or storm: Secondary | ICD-10-CM | POA: Diagnosis not present

## 2017-12-31 LAB — CBC WITH DIFFERENTIAL (CANCER CENTER ONLY)
BASOS ABS: 0.1 10*3/uL (ref 0.0–0.1)
Basophils Relative: 1 %
EOS PCT: 7 %
Eosinophils Absolute: 0.4 10*3/uL (ref 0.0–0.5)
HCT: 43.3 % (ref 38.4–49.9)
Hemoglobin: 14.6 g/dL (ref 13.0–17.1)
LYMPHS ABS: 0.9 10*3/uL (ref 0.9–3.3)
LYMPHS PCT: 17 %
MCH: 30.3 pg (ref 27.2–33.4)
MCHC: 33.6 g/dL (ref 32.0–36.0)
MCV: 90.2 fL (ref 79.3–98.0)
MONO ABS: 0.5 10*3/uL (ref 0.1–0.9)
Monocytes Relative: 9 %
Neutro Abs: 3.8 10*3/uL (ref 1.5–6.5)
Neutrophils Relative %: 66 %
PLATELETS: 301 10*3/uL (ref 140–400)
RBC: 4.8 MIL/uL (ref 4.20–5.82)
RDW: 13.5 % (ref 11.0–14.6)
WBC Count: 5.6 10*3/uL (ref 4.0–10.3)

## 2017-12-31 LAB — CMP (CANCER CENTER ONLY)
ALT: 8 U/L (ref 0–55)
AST: 14 U/L (ref 5–34)
Albumin: 4.2 g/dL (ref 3.5–5.0)
Alkaline Phosphatase: 112 U/L (ref 40–150)
Anion gap: 8 (ref 3–11)
BUN: 20 mg/dL (ref 7–26)
CHLORIDE: 103 mmol/L (ref 98–109)
CO2: 25 mmol/L (ref 22–29)
Calcium: 9.2 mg/dL (ref 8.4–10.4)
Creatinine: 1.12 mg/dL (ref 0.70–1.30)
Glucose, Bld: 102 mg/dL (ref 70–140)
POTASSIUM: 3.9 mmol/L (ref 3.5–5.1)
Sodium: 136 mmol/L (ref 136–145)
Total Bilirubin: 0.5 mg/dL (ref 0.2–1.2)
Total Protein: 7.2 g/dL (ref 6.4–8.3)

## 2018-01-01 LAB — PROSTATE-SPECIFIC AG, SERUM (LABCORP): PROSTATE SPECIFIC AG, SERUM: 62.5 ng/mL — AB (ref 0.0–4.0)

## 2018-01-02 ENCOUNTER — Telehealth: Payer: Self-pay | Admitting: Oncology

## 2018-01-02 ENCOUNTER — Inpatient Hospital Stay (HOSPITAL_BASED_OUTPATIENT_CLINIC_OR_DEPARTMENT_OTHER): Payer: Medicare Other | Admitting: Oncology

## 2018-01-02 VITALS — BP 132/76 | HR 65 | Temp 97.7°F | Resp 18 | Ht 74.0 in | Wt 224.0 lb

## 2018-01-02 DIAGNOSIS — E0590 Thyrotoxicosis, unspecified without thyrotoxic crisis or storm: Secondary | ICD-10-CM

## 2018-01-02 DIAGNOSIS — C61 Malignant neoplasm of prostate: Secondary | ICD-10-CM

## 2018-01-02 DIAGNOSIS — I10 Essential (primary) hypertension: Secondary | ICD-10-CM

## 2018-01-02 DIAGNOSIS — C7951 Secondary malignant neoplasm of bone: Secondary | ICD-10-CM | POA: Diagnosis not present

## 2018-01-02 NOTE — Progress Notes (Signed)
Hematology and Oncology Follow Up Visit  Edward Bailey [id number] [birth year] 82 y.o. 01/02/2018 1:11 PM [name], MDGriffin, [name], MD   Principle Diagnosis: 82 year old man with castration-resistant metastatic prostate cancer documented in 2017.  He was initially diagnosed in 1999 with Gleason score 3+4 = 7 and a PSA of 4.  Currently he has disease to the bone.   Prior Therapy:  He is status post prostatectomy and found to have stage T3b disease. He received salvage radiation therapy for a rise in his PSA.  He developed biochemical relapse and was treated with androgen depravation intermittently between 2010 to about 2015.  He developed castration resistant disease in 2015.  Ketoconazole at 200 mg twice a day and prednisone 5 mg daily. Therapy discontinued on 05/24/2016 because of progression of disease. Zytiga 1000 mg daily with prednisone at 5 mg daily started in November 2017.  Current therapy: Xtandi 160 mg daily started in April 2019.  Interim History:  Mr. Edward Bailey is here for a follow-up visit.  Since the last visit, he reports no major changes in his health.  He continues to feel reasonably well and has intentionally lost weight.  He reports no complications related to [facility].  He denies any excessive fatigue or tiredness.  He denied any alteration of his mental status or excessive fatigue.  His performance status remains about the same.  He denies any worsening bone pain at this time.  His quality of life remained excellent.    He does not report any headaches, blurred vision or syncope.  He denies any confusion or neuropathy.  He does not report any fevers, chills or sweats. He does not report any chest pain, shortness of breath, leg edema or palpitation. He does not report any wheezing, cough or hemoptysis.  He does not report any constipation, diarrhea or hematochezia.  He denies any abdominal pain or distention.  He does not report any frequency urgency or hesitancy.  He  does not report any paralysis or myalgias he is not report any lymphadenopathy or petechiae.  He does not report any skin rashes or lesions.  He denies any depression or anxiety.  The rest of his review of systems is negative.      Medications: I have reviewed the patient's current medications.  Current Outpatient Medications  Medication Sig Dispense Refill  . aspirin (ASPIRIN EC) 81 MG EC tablet Take 81 mg by mouth daily. Swallow whole.    Marland Kitchen azelastine (ASTELIN) 0.1 % nasal spray Place 1-2 sprays into both nostrils daily as needed. allergies  5  . cetirizine (ZYRTEC) 10 MG tablet Take 10 mg by mouth daily as needed for allergies. As needed    . fluticasone (FLONASE) 50 MCG/ACT nasal spray Place 1 spray into both nostrils daily as needed. Allergies  5  . levothyroxine (SYNTHROID, LEVOTHROID) 112 MCG tablet TK 1 T PO QD IN THE MORNING OES  12  . levothyroxine (SYNTHROID, LEVOTHROID) 125 MCG tablet Alternates with 112 mcg  9  . Multiple Vitamins-Minerals (MULTIVITAMIN PO) Take 1 capsule by mouth daily.    [name] 40 MG capsule TAKE 4 CAPSULES (160 MG TOTAL) BY MOUTH DAILY. 120 capsule 0   No current facility-administered medications for this visit.      Allergies:  Allergies  Allergen Reactions  . Amoxicillin Rash    Has patient had a PCN reaction causing immediate rash, facial/tongue/throat swelling, SOB or lightheadedness with hypotension: No Has patient had a PCN reaction causing severe rash involving  mucus membranes or skin necrosis: No Has patient had a PCN reaction that required hospitalization No Has patient had a PCN reaction occurring within the last 10 years: Yes If all of the above answers are "NO", then may proceed with Cephalosporin use.     Past Medical History, Surgical history, Social history, and Family History were reviewed and updated.   Physical Exam:  Blood pressure 132/76, pulse 65, temperature 97.7 F (36.5 C), temperature source Oral, resp. rate 18,  height 6\' 2"  (1.88 m), weight 224 lb (101.6 kg), SpO2 98 %.    ECOG: 0 General appearance: Alert, awake gentleman without distress. Head: Atraumatic without abnormalities.. Oropharynx: Mucous membranes are moist and pink. Eyes: Pupils are equal and round reactive to light. Lymph nodes: No cervical, axillary, inguinal or supraclavicular adenopathy Heart: Regular rate and rhythm without any murmurs or gallops. Lung: Clear to auscultation all lung fields without any wheezes or dullness to percussion. Abdomin: Soft, any rebound or guarding.  No shifting dullness or ascites. Musculoskeletal: No joint deformity or effusion. Skin: No skin rashes or lesions. Neurological: Intact exam motor, sensory and deep tendon reflexes  Lab Results: Lab Results  Component Value Date   WBC 5.6 12/31/2017   HGB 14.6 12/31/2017   HCT 43.3 12/31/2017   MCV 90.2 12/31/2017   PLT 301 12/31/2017     Chemistry      Component Value Date/Time   NA 136 12/31/2017 0951   NA 139 06/07/2017 1057   K 3.9 12/31/2017 0951   K 3.6 06/07/2017 1057   CL 103 12/31/2017 0951   CO2 25 12/31/2017 0951   CO2 26 06/07/2017 1057   BUN 20 12/31/2017 0951   BUN 18.5 06/07/2017 1057   CREATININE 1.12 12/31/2017 0951   CREATININE 1.2 06/07/2017 1057      Component Value Date/Time   CALCIUM 9.2 12/31/2017 0951   CALCIUM 9.0 06/07/2017 1057   ALKPHOS 112 12/31/2017 0951   ALKPHOS 112 06/07/2017 1057   AST 14 12/31/2017 0951   AST 14 06/07/2017 1057   ALT 8 12/31/2017 0951   ALT 12 06/07/2017 1057   BILITOT 0.5 12/31/2017 0951   BILITOT 0.66 06/07/2017 1057      Results for [name], [name] (MRN [id number]) as of 01/02/2018 12:49  Ref. Range 10/04/2017 10:20 11/20/2017 09:02 12/31/2017 09:51  Prostate Specific Ag, Serum Latest Ref Range: 0.0 - 4.0 ng/mL 30.7 (H) 45.1 (H) 62.5 (H)      Impression and Plan:  82 year old man with  1.  Castration-resistant metastatic prostate cancer to the bone documented since  2017.  She has progressed on the therapies including Zytiga and currently Xtandi with bone disease only.  The natural course of this disease was reviewed again with the patient as well as treatment options.  His PSA is doubling rapidly in less than 3 months although he is overall asymptomatic.  He will likely be more symptomatic in the near future if left untreated.  Options of therapy would include chemotherapy versus Xofigo.  Risks and benefits of all these options were reviewed again with him including potential complications.  He understands he will likely require both of those treatments in the future.  In which order it really depends on his preference.  Given the fact that he is asymptomatic from his cancer, he would like to defer the option of chemotherapy to a later date.  After discussion today, I have recommended stopping Xtandi and he would like to proceed with Xofigo.  I  will refer him back to Dr. Tammi Klippel to start of treatment in the immediate future.  2. Androgen depravation: No issues related to that under the care of Dr. Diona Fanti.  He is agreeable to continue.  3.  Bone directed therapy: He remains on Xgeva without any recent complications.  Long-term issues associated with this medication include hypocalcemia and osteonecrosis of the jaw.  4. Hyperthyroidism: Managed with Synthroid and appears to be under reasonable control.  5.  Hypertension: His blood pressure remains within normal range.  6. Followup: Be in the next 2 months to follow his progress.   minutes was spent with the patient face-to-face today.  More than 50% of time was dedicated to patient counseling, education and coordinating his care.   Zola Button, MD 6/13/20191:11 PM

## 2018-01-02 NOTE — Telephone Encounter (Signed)
Appointments scheduled AVS/Calendar printed per 6/13 los °

## 2018-01-03 ENCOUNTER — Other Ambulatory Visit: Payer: Self-pay | Admitting: Radiation Oncology

## 2018-01-03 ENCOUNTER — Telehealth: Payer: Self-pay | Admitting: *Deleted

## 2018-01-03 DIAGNOSIS — C61 Malignant neoplasm of prostate: Secondary | ICD-10-CM

## 2018-01-03 DIAGNOSIS — R2681 Unsteadiness on feet: Secondary | ICD-10-CM | POA: Diagnosis not present

## 2018-01-03 DIAGNOSIS — C7951 Secondary malignant neoplasm of bone: Secondary | ICD-10-CM

## 2018-01-03 NOTE — Telephone Encounter (Signed)
CALLED PATIENT TO INFORM OF LAB AND WEIGHT ON 02-03-18 @ 12 PM @ [place]. ON 02-10-18 - ARRIVAL TIME - 11:45 AM @ [facility], SPOKE WITH PATIENT AND HE IS AWARE OF THESE APPTS.

## 2018-01-07 DIAGNOSIS — R2681 Unsteadiness on feet: Secondary | ICD-10-CM | POA: Diagnosis not present

## 2018-01-09 ENCOUNTER — Encounter: Payer: Self-pay | Admitting: Oncology

## 2018-01-09 ENCOUNTER — Telehealth: Payer: Self-pay | Admitting: *Deleted

## 2018-01-09 NOTE — Telephone Encounter (Signed)
Per dr Alen Blew, no treatment needed until appt with rad/onc.

## 2018-01-10 DIAGNOSIS — R2681 Unsteadiness on feet: Secondary | ICD-10-CM | POA: Diagnosis not present

## 2018-01-13 DIAGNOSIS — H5203 Hypermetropia, bilateral: Secondary | ICD-10-CM | POA: Diagnosis not present

## 2018-01-14 DIAGNOSIS — C61 Malignant neoplasm of prostate: Secondary | ICD-10-CM | POA: Diagnosis not present

## 2018-01-14 DIAGNOSIS — R2681 Unsteadiness on feet: Secondary | ICD-10-CM | POA: Diagnosis not present

## 2018-01-14 DIAGNOSIS — C7951 Secondary malignant neoplasm of bone: Secondary | ICD-10-CM | POA: Diagnosis not present

## 2018-01-16 DIAGNOSIS — R2681 Unsteadiness on feet: Secondary | ICD-10-CM | POA: Diagnosis not present

## 2018-01-20 DIAGNOSIS — R2681 Unsteadiness on feet: Secondary | ICD-10-CM | POA: Diagnosis not present

## 2018-01-24 DIAGNOSIS — R2681 Unsteadiness on feet: Secondary | ICD-10-CM | POA: Diagnosis not present

## 2018-01-28 DIAGNOSIS — R2681 Unsteadiness on feet: Secondary | ICD-10-CM | POA: Diagnosis not present

## 2018-01-31 ENCOUNTER — Other Ambulatory Visit: Payer: Self-pay | Admitting: Radiation Oncology

## 2018-01-31 ENCOUNTER — Telehealth: Payer: Self-pay | Admitting: *Deleted

## 2018-01-31 DIAGNOSIS — C61 Malignant neoplasm of prostate: Secondary | ICD-10-CM

## 2018-01-31 NOTE — Telephone Encounter (Signed)
CALLED PATIENT TO REMIND OF LABS AND WEIGHT FOR 02/03/18 @ 12 PM, LVM FOR A RETURN CALL

## 2018-02-03 ENCOUNTER — Ambulatory Visit
Admission: RE | Admit: 2018-02-03 | Discharge: 2018-02-03 | Disposition: A | Discharge status: Home / Self Care | Payer: Medicare Other | Source: Ambulatory Visit | Attending: Radiation Oncology | Admitting: Radiation Oncology

## 2018-02-03 DIAGNOSIS — C61 Malignant neoplasm of prostate: Secondary | ICD-10-CM | POA: Insufficient documentation

## 2018-02-03 LAB — CBC WITH DIFFERENTIAL (CANCER CENTER ONLY)
Basophils Absolute: 0 10*3/uL (ref 0.0–0.1)
Basophils Relative: 1 %
Eosinophils Absolute: 0.3 10*3/uL (ref 0.0–0.5)
Eosinophils Relative: 6 %
HEMATOCRIT: 40.8 % (ref 38.4–49.9)
HEMOGLOBIN: 13.7 g/dL (ref 13.0–17.1)
LYMPHS ABS: 0.8 10*3/uL — AB (ref 0.9–3.3)
LYMPHS PCT: 14 %
MCH: 30.5 pg (ref 27.2–33.4)
MCHC: 33.6 g/dL (ref 32.0–36.0)
MCV: 90.9 fL (ref 79.3–98.0)
Monocytes Absolute: 0.6 10*3/uL (ref 0.1–0.9)
Monocytes Relative: 11 %
NEUTROS ABS: 3.9 10*3/uL (ref 1.5–6.5)
NEUTROS PCT: 68 %
PLATELETS: 254 10*3/uL (ref 140–400)
RBC: 4.48 MIL/uL (ref 4.20–5.82)
RDW: 13.8 % (ref 11.0–14.6)
WBC: 5.7 10*3/uL (ref 4.0–10.3)

## 2018-02-04 DIAGNOSIS — R2681 Unsteadiness on feet: Secondary | ICD-10-CM | POA: Diagnosis not present

## 2018-02-04 DIAGNOSIS — E0590 Thyrotoxicosis, unspecified without thyrotoxic crisis or storm: Secondary | ICD-10-CM | POA: Diagnosis not present

## 2018-02-06 DIAGNOSIS — R413 Other amnesia: Secondary | ICD-10-CM | POA: Diagnosis not present

## 2018-02-07 ENCOUNTER — Telehealth: Payer: Self-pay | Admitting: *Deleted

## 2018-02-07 NOTE — Telephone Encounter (Signed)
CALLED PATIENT TO REMIND OF XOFIGO INJ. FOR 02-10-18 - ARRIVAL TIME - 11:45 AM, NO ANSWER.

## 2018-02-10 ENCOUNTER — Encounter (HOSPITAL_COMMUNITY)
Admission: RE | Admit: 2018-02-10 | Discharge: 2018-02-10 | Disposition: A | Discharge status: Home / Self Care | Payer: Medicare Other | Source: Ambulatory Visit | Attending: Radiation Oncology | Admitting: Radiation Oncology

## 2018-02-10 DIAGNOSIS — C7951 Secondary malignant neoplasm of bone: Secondary | ICD-10-CM

## 2018-02-10 DIAGNOSIS — C61 Malignant neoplasm of prostate: Secondary | ICD-10-CM | POA: Diagnosis not present

## 2018-02-10 MED ORDER — RADIUM RA 223 DICHLORIDE 30 MCCI/ML IV SOLN
140.5900 | Freq: Once | INTRAVENOUS | Status: AC
  Administered 2018-02-10: 140.59 via INTRAVENOUS

## 2018-02-10 NOTE — Progress Notes (Signed)
  Radiation Oncology         ([phone]) [phone] ________________________________  Name: Edward Bailey MRN: [id number]  Date: 02/10/2018  DOB: [birth year]  [facility] Infusion Note  Diagnosis:  Castration resistant prostate cancer with painful bone involvement  Current Infusion:    1  Planned Infusions:  6  Narrative: Edward Bailey presented to nuclear medicine for treatment. His most recent blood counts were reviewed.  He remains a good candidate to proceed with Ra-223.  The patient was situated in an infusion suite with a contact barrier placed under his arm. Intravenous access was established, using sterile technique, and a normal saline infusion from a syringe was started.  Micro-dosimetry:  The prescribed radiation activity was assayed and confirmed to be within specified tolerance.  Special Treatment Procedure - Infusion:  The nuclear medicine technologist and I personally verified the dose activity to be delivered as specified in the written directive, and verified the patient identification via 2 separate methods.  The syringe containing the dose was attached to a 3 way stopcock, and then the valve was opened to the patient, and the dose delivered over a minute. No complications were noted.  The total administered dose was 144.1 microcuries in a volume of 7.3 cc.   A saline flush of the line and the syringe that contained the isotope was then performed.  The residual radioactivity in the syringe was 3.51 microcuries, so the actual infused isotope activity was 140.59 microcuries.   Pressure was applied to the venipuncture site, and a compression bandage placed.   Radiation Safety personnel were present to perform the discharge survey, as detailed on their documentation.   After a short period of observation, the patient had his IV removed.  Impression:  The patient tolerated his infusion relatively well.  Plan:  The patient will return in one month for ongoing care.      ________________________________  Sheral Apley. Tammi Klippel, M.D.  This document serves as a record of services personally performed by Tyler Pita, MD. It was created on his behalf by Rae Lips, a trained medical scribe. The creation of this record is based on the scribe's personal observations and the provider's statements to them. This document has been checked and approved by the attending provider.

## 2018-02-11 ENCOUNTER — Other Ambulatory Visit: Payer: Self-pay | Admitting: Internal Medicine

## 2018-02-11 DIAGNOSIS — C61 Malignant neoplasm of prostate: Secondary | ICD-10-CM | POA: Diagnosis not present

## 2018-02-11 DIAGNOSIS — R413 Other amnesia: Secondary | ICD-10-CM

## 2018-02-11 DIAGNOSIS — C7951 Secondary malignant neoplasm of bone: Secondary | ICD-10-CM | POA: Diagnosis not present

## 2018-02-11 DIAGNOSIS — R2681 Unsteadiness on feet: Secondary | ICD-10-CM | POA: Diagnosis not present

## 2018-02-14 ENCOUNTER — Telehealth: Payer: Self-pay | Admitting: *Deleted

## 2018-02-14 NOTE — Telephone Encounter (Signed)
Called patient to give an update, spoke with patient 

## 2018-02-19 ENCOUNTER — Telehealth: Payer: Self-pay | Admitting: *Deleted

## 2018-02-19 ENCOUNTER — Other Ambulatory Visit: Payer: Self-pay | Admitting: Radiation Oncology

## 2018-02-19 DIAGNOSIS — C61 Malignant neoplasm of prostate: Secondary | ICD-10-CM

## 2018-02-19 DIAGNOSIS — C7951 Secondary malignant neoplasm of bone: Secondary | ICD-10-CM | POA: Diagnosis not present

## 2018-02-19 NOTE — Telephone Encounter (Signed)
CALLED PATIENT TO INFORM OF LAB AND WEIGHT ON 03-10-18 @ 12 PM @ [place]. ON 03-17-18 - ARRIVAL TIME - 11:45 AM @ [facility], SPOKE WITH PATIENT AND HE IS AWARE OF THIS INJ.

## 2018-02-20 ENCOUNTER — Ambulatory Visit
Admission: RE | Admit: 2018-02-20 | Discharge: 2018-02-20 | Disposition: A | Discharge status: Home / Self Care | Payer: Medicare Other | Source: Ambulatory Visit | Attending: Internal Medicine | Admitting: Internal Medicine

## 2018-02-20 ENCOUNTER — Other Ambulatory Visit: Payer: Medicare Other

## 2018-02-20 DIAGNOSIS — R413 Other amnesia: Secondary | ICD-10-CM

## 2018-02-20 DIAGNOSIS — I6522 Occlusion and stenosis of left carotid artery: Secondary | ICD-10-CM | POA: Diagnosis not present

## 2018-03-07 ENCOUNTER — Other Ambulatory Visit: Payer: Self-pay | Admitting: Radiation Oncology

## 2018-03-07 ENCOUNTER — Telehealth: Payer: Self-pay | Admitting: *Deleted

## 2018-03-07 DIAGNOSIS — C7952 Secondary malignant neoplasm of bone marrow: Principal | ICD-10-CM

## 2018-03-07 DIAGNOSIS — C7951 Secondary malignant neoplasm of bone: Secondary | ICD-10-CM

## 2018-03-07 NOTE — Telephone Encounter (Signed)
CALLED PATIENT TO REMIND OF LAB AND WEIGHT FOR 03-10-18 @ 12 PM @ [place], SPOKE WITH PATIENT AND HE IS AWARE OF THIS APPT.

## 2018-03-10 ENCOUNTER — Ambulatory Visit
Admission: RE | Admit: 2018-03-10 | Discharge: 2018-03-10 | Disposition: A | Discharge status: Home / Self Care | Payer: Medicare Other | Source: Ambulatory Visit | Attending: Radiation Oncology | Admitting: Radiation Oncology

## 2018-03-10 DIAGNOSIS — C61 Malignant neoplasm of prostate: Secondary | ICD-10-CM

## 2018-03-10 DIAGNOSIS — C7951 Secondary malignant neoplasm of bone: Secondary | ICD-10-CM

## 2018-03-10 DIAGNOSIS — C7952 Secondary malignant neoplasm of bone marrow: Secondary | ICD-10-CM

## 2018-03-10 LAB — CBC WITH DIFFERENTIAL (CANCER CENTER ONLY)
Basophils Absolute: 0 K/uL (ref 0.0–0.1)
Basophils Relative: 1 %
Eosinophils Absolute: 0.3 K/uL (ref 0.0–0.5)
Eosinophils Relative: 4 %
HCT: 39 % (ref 38.4–49.9)
Hemoglobin: 13 g/dL (ref 13.0–17.1)
Lymphocytes Relative: 18 %
Lymphs Abs: 1.1 K/uL (ref 0.9–3.3)
MCH: 30.4 pg (ref 27.2–33.4)
MCHC: 33.3 g/dL (ref 32.0–36.0)
MCV: 91.1 fL (ref 79.3–98.0)
Monocytes Absolute: 0.6 K/uL (ref 0.1–0.9)
Monocytes Relative: 10 %
Neutro Abs: 4 K/uL (ref 1.5–6.5)
Neutrophils Relative %: 67 %
Platelet Count: 262 K/uL (ref 140–400)
RBC: 4.28 MIL/uL (ref 4.20–5.82)
RDW: 13.7 % (ref 11.0–14.6)
WBC Count: 5.9 K/uL (ref 4.0–10.3)

## 2018-03-10 LAB — CMP (CANCER CENTER ONLY)
ALT: 8 U/L (ref 0–44)
AST: 12 U/L — ABNORMAL LOW (ref 15–41)
Albumin: 3.6 g/dL (ref 3.5–5.0)
Alkaline Phosphatase: 184 U/L — ABNORMAL HIGH (ref 38–126)
Anion gap: 5 (ref 5–15)
BUN: 15 mg/dL (ref 8–23)
CO2: 26 mmol/L (ref 22–32)
Calcium: 8 mg/dL — ABNORMAL LOW (ref 8.9–10.3)
Chloride: 108 mmol/L (ref 98–111)
Creatinine: 0.91 mg/dL (ref 0.61–1.24)
GFR, Est AFR Am: >60 mL/min
GFR, Estimated: >60 mL/min
Glucose, Bld: 92 mg/dL (ref 70–99)
Potassium: 4.2 mmol/L (ref 3.5–5.1)
Sodium: 139 mmol/L (ref 135–145)
Total Bilirubin: 0.4 mg/dL (ref 0.3–1.2)
Total Protein: 6.6 g/dL (ref 6.5–8.1)

## 2018-03-11 ENCOUNTER — Inpatient Hospital Stay: Payer: Medicare Other

## 2018-03-11 DIAGNOSIS — C61 Malignant neoplasm of prostate: Secondary | ICD-10-CM | POA: Diagnosis not present

## 2018-03-11 DIAGNOSIS — C7951 Secondary malignant neoplasm of bone: Secondary | ICD-10-CM | POA: Diagnosis not present

## 2018-03-11 LAB — PROSTATE-SPECIFIC AG, SERUM (LABCORP): PROSTATE SPECIFIC AG, SERUM: 225 ng/mL — AB (ref 0.0–4.0)

## 2018-03-13 ENCOUNTER — Inpatient Hospital Stay: Payer: Medicare Other | Attending: Oncology | Admitting: Oncology

## 2018-03-13 ENCOUNTER — Telehealth: Payer: Self-pay | Admitting: Oncology

## 2018-03-13 VITALS — BP 113/64 | HR 64 | Temp 98.2°F | Resp 18 | Ht 74.0 in | Wt 216.3 lb

## 2018-03-13 DIAGNOSIS — C61 Malignant neoplasm of prostate: Secondary | ICD-10-CM | POA: Diagnosis not present

## 2018-03-13 DIAGNOSIS — E0590 Thyrotoxicosis, unspecified without thyrotoxic crisis or storm: Secondary | ICD-10-CM | POA: Diagnosis not present

## 2018-03-13 DIAGNOSIS — C7951 Secondary malignant neoplasm of bone: Secondary | ICD-10-CM | POA: Diagnosis not present

## 2018-03-13 DIAGNOSIS — I10 Essential (primary) hypertension: Secondary | ICD-10-CM | POA: Insufficient documentation

## 2018-03-13 MED ORDER — ENZALUTAMIDE 40 MG PO CAPS: 160.0000 mg | ORAL_CAPSULE | Freq: Every day | ORAL | 0 refills | Status: DC

## 2018-03-13 NOTE — Progress Notes (Signed)
Hematology and Oncology Follow Up Visit  Edward Bailey [id number] 1936-07-02 82 y.o. 03/13/2018 1:25 PM [name], MDGriffin, [name], MD   Principle Diagnosis: 82 year old man with castration-resistant metastatic prostate cancer with initial diagnosis in 1999.  His Gleason score 3+4 = 7 and a PSA of 4 and subsequently developed advanced disease to the bone in 2017.   Prior Therapy:  He is status post prostatectomy and found to have stage T3b disease. He received salvage radiation therapy for a rise in his PSA.  He developed biochemical relapse and was treated with androgen depravation intermittently between 2010 to about 2015.  He developed castration resistant disease in 2015.  Ketoconazole at 200 mg twice a day and prednisone 5 mg daily. Therapy discontinued on 05/24/2016 because of progression of disease. Zytiga 1000 mg daily with prednisone at 5 mg daily started in November 2017. Xtandi 160 mg daily started in April 2019 therapy discontinued due to progression of disease and July 2019.  Current therapy: [name] started in July 2019.  Interim History:  Mr. Edward Bailey presents today for a follow-up visit.  Since the last visit, he received his first a Xofigo injection on 02/10/2018.  He reports no major changes or complaints related to this therapy.  He denies any excessive fatigue or tiredness.  He denies any arthralgias or myalgias.  He denies any constitutional symptoms or weight changes.  He continues to perform activities of daily living without any decline.  He denies any pelvic discomfort or urination difficulties.  His performance status and activity level remain intact.   He does not report any headaches, blurred vision or syncope.  He denies any alteration mental status or confusion.  He does not report any fevers, chills or sweats. He does not report any chest pain, shortness of breath, leg edema.  He denies any orthopnea or leg edema.  He does not report any wheezing, cough or  hemoptysis.  He does not report any constipation, diarrhea or hematochezia.  He denies any changes in his bowel habits.  He does not report any frequency urgency or hesitancy.  He does not report any bone pain or pathological fractures.  He is not report any lymphadenopathy or petechiae.  He does not report any bleeding or clotting tendencies.  He denies any changes in his mood.  The rest of his review of systems is negative.      Medications: I have reviewed the patient's current medications.  Current Outpatient Medications  Medication Sig Dispense Refill  . aspirin (ASPIRIN EC) 81 MG EC tablet Take 81 mg by mouth daily. Swallow whole.    Marland Kitchen azelastine (ASTELIN) 0.1 % nasal spray Place 1-2 sprays into both nostrils daily as needed. allergies  5  . cetirizine (ZYRTEC) 10 MG tablet Take 10 mg by mouth daily as needed for allergies. As needed    . fluticasone (FLONASE) 50 MCG/ACT nasal spray Place 1 spray into both nostrils daily as needed. Allergies  5  . levothyroxine (SYNTHROID, LEVOTHROID) 112 MCG tablet TK 1 T PO QD IN THE MORNING OES  12  . levothyroxine (SYNTHROID, LEVOTHROID) 125 MCG tablet Alternates with 112 mcg  9  . Multiple Vitamins-Minerals (MULTIVITAMIN PO) Take 1 capsule by mouth daily.    [name] 40 MG capsule TAKE 4 CAPSULES (160 MG TOTAL) BY MOUTH DAILY. 120 capsule 0   No current facility-administered medications for this visit.      Allergies:  Allergies  Allergen Reactions  . Amoxicillin Rash  Has patient had a PCN reaction causing immediate rash, facial/tongue/throat swelling, SOB or lightheadedness with hypotension: No Has patient had a PCN reaction causing severe rash involving mucus membranes or skin necrosis: No Has patient had a PCN reaction that required hospitalization No Has patient had a PCN reaction occurring within the last 10 years: Yes If all of the above answers are "NO", then may proceed with Cephalosporin use.     Past Medical History, Surgical  history, Social history, and Family History were reviewed and updated.   Physical Exam:  Blood pressure 113/64, pulse 64, temperature 98.2 F (36.8 C), temperature source Oral, resp. rate 18, height 6\' 2"  (1.88 m), weight 216 lb 4.8 oz (98.1 kg), SpO2 99 %.     ECOG: 1 General appearance: Well-appearing gentleman without distress.   Head: No trauma or abnormalities. Oropharynx: Mucous membranes are moist and pink. Eyes: Sclera anicteric. Lymph nodes: No lymphadenopathy noted in the cervical, axillary, inguinal or supraclavicular regions. Heart: Regular rate and rhythm.  S1 and S2. Lung: Clear without any rhonchi, wheezes or dullness to percussion. Abdomin: Soft, without any rebound or guarding.  No shifting dullness or ascites. Musculoskeletal: No clubbing or cyanosis. Skin: No ecchymosis or petechiae. Neurological: Intact motor, sensory and deep tendon reflexes.  Lab Results: Lab Results  Component Value Date   WBC 5.9 03/10/2018   HGB 13.0 03/10/2018   HCT 39.0 03/10/2018   MCV 91.1 03/10/2018   PLT 262 03/10/2018     Chemistry      Component Value Date/Time   NA 139 03/10/2018 1141   NA 139 06/07/2017 1057   K 4.2 03/10/2018 1141   K 3.6 06/07/2017 1057   CL 108 03/10/2018 1141   CO2 26 03/10/2018 1141   CO2 26 06/07/2017 1057   BUN 15 03/10/2018 1141   BUN 18.5 06/07/2017 1057   CREATININE 0.91 03/10/2018 1141   CREATININE 1.2 06/07/2017 1057      Component Value Date/Time   CALCIUM 8.0 (L) 03/10/2018 1141   CALCIUM 9.0 06/07/2017 1057   ALKPHOS 184 (H) 03/10/2018 1141   ALKPHOS 112 06/07/2017 1057   AST 12 (L) 03/10/2018 1141   AST 14 06/07/2017 1057   ALT 8 03/10/2018 1141   ALT 12 06/07/2017 1057   BILITOT 0.4 03/10/2018 1141   BILITOT 0.66 06/07/2017 1057       Results for [name], [name] (MRN [id number]) as of 03/13/2018 12:34  Ref. Range 11/20/2017 09:02 12/31/2017 09:51 03/10/2018 11:41  Prostate Specific Ag, Serum Latest Ref Range: 0.0 - 4.0  ng/mL 45.1 (H) 62.5 (H) 225.0 (H)      Impression and Plan:  82 year old man with  1.  Prostate cancer diagnosed in 1999 and currently has castration-resistant metastatic disease to the bone documented since 2017.  He is status post therapies outlined above and has started [place] infusion in July 2019.  His PSA did show a rapid rise in the last 2 months that is out of proportion to his previous PSA velocity and disease status.  His laboratory data otherwise showed mild elevation in his alkaline phosphatase but no other abnormalities.  The natural course of his disease as well as treatment options were reviewed today which include continuing Xofigo versus switching to systemic chemotherapy.  I have recommended obtaining a CT scan of the abdomen and pelvis to rule out visceral metastasis and continue with Xofigo for the time being.  If bulky visceral metastasis is detected, and switching to systemic chemotherapy may be  mandatory.  After discussion today, he is agreeable to proceed with CT scan and willing to restart Xtandi at this time.  The rise in the PSA might have correlated with Xtandi discontinuation and he is inquiring about restarting this medication.  Despite of robust PSA response, [name] has been reasonable in controlling his disease status.  After discussion today, I have recommended resuming Xtandi for the time being.  He understands that [place] will need to be discontinued if he has visceral metastasis that require systemic chemotherapy urgently.  I will communicate the results of the CT scan for him in the in the immediate future.  2. Androgen depravation: He continues to receive androgen deprivation under the care of Dr. Diona Fanti.  I recommended continuing this indefinitely.  3.  Bone directed therapy: He is currently on Xgeva without any recent complications.  I have recommended continuing this for the time being.  4. Hyperthyroidism: No issues reported at this time he  remains on Synthroid.  5.  Hypertension: Blood pressure appears under control for the time being.  6. Followup: We will be in 4 weeks sooner if needed to.    25 minutes was spent with the patient face-to-face today.  More than 50% of time was dedicated to discussing the natural course of this disease, treatment options as well as reviewing his laboratory data.   Zola Button, MD 8/22/20191:25 PM

## 2018-03-13 NOTE — Telephone Encounter (Signed)
Scheduled appt per 8/22 los - gave patient aVS and calender per los 

## 2018-03-14 ENCOUNTER — Telehealth: Payer: Self-pay | Admitting: *Deleted

## 2018-03-14 ENCOUNTER — Telehealth: Payer: Self-pay | Admitting: Pharmacist

## 2018-03-14 NOTE — Telephone Encounter (Signed)
Oral Chemotherapy Pharmacist Encounter   Attempted to reach patient to provide update and offer for re-counseling on oral medication: Xtandi.  No answer. Left VM for patient to call back.   Johny Drilling, PharmD, BCPS, BCOP  03/14/2018   12:33 PM Oral Oncology Clinic [phone]

## 2018-03-14 NOTE — Telephone Encounter (Signed)
Called patient to remind of Xofigo inj.  for 03-17-18- arrival time - 11-45 am @ [facility], spoke with patient and he is aware of this inj.

## 2018-03-14 NOTE — Telephone Encounter (Signed)
Oral Oncology Pharmacist Encounter  Received notification of re-start of Xtandi (enzalutamide) for the treatment of metastatic, catsration-resistant prostate cancer, planned duration until disease progression or unacceptable toxicity. Patient most recently on treatment with [name], first injection 02/10/2018, will continue Patient previously on therapy with Xtandi, discontinued on 01/02/2018, now to restart due to inadequate response with Darron Doom to be restarted at standard dosing.  Labs from 03/10/18 assessed, OK for treatment.  Current medication list in Epic reviewed, no DDIs with Xtandi identified.  Prescription has been e-scribed to the [employer] for benefits analysis and approval. Insurance authorization obtained earlier in 2019 is still active. Test claim at the pharmacy revealed copayment $585.51 Patient previously non eligible for copayment great assistance due to income outside of cut-off range. We will verify income has not changed.  Oral Oncology Clinic will continue to follow for copayment issues, re-counseling and start date.  Johny Drilling, PharmD, BCPS, BCOP  03/14/2018 12:14 PM Oral Oncology Clinic [phone]

## 2018-03-14 NOTE — Telephone Encounter (Signed)
Oral Chemotherapy Pharmacist Encounter   I spoke with patient for overview of: Xtandi re-start.   Counseled patient on administration, dosing, side effects, monitoring, drug-food interactions, safe handling, storage, and disposal.  Patient will take Xtandi 40mg  capsules, 4 capsules (160mg ) by mouth once daily without regard to food. Patient states he will take his Xtandi in the morning with breakfast.  Xtandi re-start date: 03/17/2018  Adverse effects include but are not limited to: peripheral edema, GI upset, hypertension, hot flashes, fatigue, falls/fractures, and arthralgias.   Patient instructed about small risk of seizures with Xtandi treatment.  Reviewed with patient importance of keeping a medication schedule and plan for any missed doses.  He states he previously tolerated treatment with Xtandi very well.   All questions answered. Medication reconciliation performed and medication/allergy list updated.  Patient states his income has not changed. Patient informed he does not qualify for any copayment assistance available at this time.  Patient will pick up his [name] from the [facility] on Monday 03/17/2018 for copayment $585 Patient instructed the pharmacy will call him ~ 1 week before he needs his next fill to coordinate.  All questions answered. [name] voiced understanding and appreciation.  Patient knows to call the office with questions or concerns.  Oral Oncology Clinic will continue to follow.  Edward Bailey, PharmD, BCPS, BCOP  03/14/2018   3:25 PM Oral Oncology Clinic [phone]

## 2018-03-17 ENCOUNTER — Ambulatory Visit (HOSPITAL_COMMUNITY)
Admission: RE | Admit: 2018-03-17 | Discharge: 2018-03-17 | Disposition: A | Discharge status: Home / Self Care | Payer: Medicare Other | Source: Ambulatory Visit | Attending: Radiation Oncology | Admitting: Radiation Oncology

## 2018-03-17 DIAGNOSIS — C7951 Secondary malignant neoplasm of bone: Secondary | ICD-10-CM | POA: Diagnosis not present

## 2018-03-17 DIAGNOSIS — C61 Malignant neoplasm of prostate: Secondary | ICD-10-CM | POA: Diagnosis not present

## 2018-03-17 MED ORDER — RADIUM RA 223 DICHLORIDE 30 MCCI/ML IV SOLN
139.3500 | Freq: Once | INTRAVENOUS | Status: AC
  Administered 2018-03-17: 139.35 via INTRAVENOUS

## 2018-03-17 MED FILL — XTANDI 40 MG CAPSULE: 40 | 30 days supply | Qty: 120 | Fill #0

## 2018-03-17 NOTE — Telephone Encounter (Signed)
Oral Oncology Patient Advocate Encounter  Confirmed with [facility] that Edward Bailey was picked up 03/17/18 with a paid copay of $585.51. He does not qualify for a grant at this time.  [place] Patient [place] Phone [phone] Fax [phone]

## 2018-03-17 NOTE — Progress Notes (Signed)
  Radiation Oncology         [phone] ________________________________  Name: Edward Bailey MRN: [id number]  Date: 03/17/2018  DOB: 1935-09-09  Radium-223 Infusion Note  Diagnosis:  Castration resistant prostate cancer with painful bone involvement  Current Infusion:    2  Planned Infusions:  6  Narrative: Mr. DAYMEON FISCHMAN presented to nuclear medicine for treatment. His most recent blood counts were reviewed.  He remains a good candidate to proceed with Ra-223.  The patient was situated in an infusion suite with a contact barrier placed under his arm. Intravenous access was established, using sterile technique, and a normal saline infusion from a syringe was started.  Micro-dosimetry:  The prescribed radiation activity was assayed and confirmed to be within specified tolerance.  Special Treatment Procedure - Infusion:  The nuclear medicine technologist and I personally verified the dose activity to be delivered as specified in the written directive, and verified the patient identification via 2 separate methods.  The syringe containing the dose was attached to a 3 way stopcock, and then the valve was opened to the patient, and the dose delivered over a minute. No complications were noted.  The total administered dose was 143.1 microcuries in a volume of 5 cc.   A saline flush of the line and the syringe that contained the isotope was then performed.  The residual radioactivity in the syringe was 3.75 microcuries, so the actual infused isotope activity was 139.35 microcuries.   Pressure was applied to the venipuncture site, and a compression bandage placed.   Radiation Safety personnel were present to perform the discharge survey, as detailed on their documentation.   After a short period of observation, the patient had his IV removed.  Impression:  The patient tolerated his infusion relatively well.  Plan:  The patient will return in one month for ongoing care.      ________________________________  Sheral Apley. Tammi Klippel, M.D.

## 2018-03-18 ENCOUNTER — Encounter (HOSPITAL_COMMUNITY): Payer: Self-pay

## 2018-03-18 ENCOUNTER — Ambulatory Visit (HOSPITAL_COMMUNITY)
Admission: RE | Admit: 2018-03-18 | Discharge: 2018-03-18 | Disposition: A | Discharge status: Home / Self Care | Payer: Medicare Other | Source: Ambulatory Visit | Attending: Oncology | Admitting: Oncology

## 2018-03-18 DIAGNOSIS — C61 Malignant neoplasm of prostate: Secondary | ICD-10-CM | POA: Diagnosis not present

## 2018-03-18 DIAGNOSIS — C7951 Secondary malignant neoplasm of bone: Secondary | ICD-10-CM | POA: Diagnosis not present

## 2018-03-18 DIAGNOSIS — K5730 Diverticulosis of large intestine without perforation or abscess without bleeding: Secondary | ICD-10-CM | POA: Insufficient documentation

## 2018-03-18 DIAGNOSIS — I2510 Atherosclerotic heart disease of native coronary artery without angina pectoris: Secondary | ICD-10-CM | POA: Diagnosis not present

## 2018-03-18 DIAGNOSIS — I700 Atherosclerosis of aorta: Secondary | ICD-10-CM | POA: Insufficient documentation

## 2018-03-18 MED ORDER — IOHEXOL 300 MG/ML  SOLN
100.0000 mL | Freq: Once | INTRAMUSCULAR | Status: AC | PRN
  Administered 2018-03-18: 100 mL via INTRAVENOUS

## 2018-03-21 ENCOUNTER — Other Ambulatory Visit: Payer: Self-pay | Admitting: Radiation Oncology

## 2018-03-21 ENCOUNTER — Telehealth: Payer: Self-pay | Admitting: *Deleted

## 2018-03-21 DIAGNOSIS — C61 Malignant neoplasm of prostate: Secondary | ICD-10-CM

## 2018-03-21 DIAGNOSIS — C7951 Secondary malignant neoplasm of bone: Secondary | ICD-10-CM

## 2018-03-21 NOTE — Telephone Encounter (Signed)
LVM FOR PATIENT TO CALL BACK FOR XIFIGO INJ/LAB. APPTS.

## 2018-03-24 NOTE — Progress Notes (Signed)
  Radiation Oncology         [phone] ________________________________  Name: JULIES CARMICKLE MRN: [id number]  Date: 11/06/2017  DOB: 31-May-1936  Xofigo Treatment Planning Note:  Diagnosis:  Castration resistant prostate cancer with painful bone involvement  Narrative: Mr.Karion H Sackmann is a patient who has been diagnosed with castration resistant prostate cancer with painful bone involvement.  His most recent blood counts show that he remains a good candidate to proceed with Ra-223.  The patient is going to receive Xofigo for his treatment.   Radiation Treatment Planning:  The prescribed radiation activity will be 50 kBq per kg per infusions. The plan is to offer a total of 6 IV administrations of this agent, assuming the blood counts are adequate prior to each administration, with each infusion done at 4 week intervals.  This will be done as an IV administration in the nuclear medicine department, with care to undertake all radiation protection precautions as recommended.   ________________________________  Sheral Apley. Tammi Klippel, M.D.

## 2018-03-26 ENCOUNTER — Other Ambulatory Visit: Payer: Self-pay | Admitting: Radiation Oncology

## 2018-03-26 DIAGNOSIS — C7951 Secondary malignant neoplasm of bone: Secondary | ICD-10-CM

## 2018-03-26 DIAGNOSIS — C7952 Secondary malignant neoplasm of bone marrow: Principal | ICD-10-CM

## 2018-04-03 ENCOUNTER — Ambulatory Visit
Admission: RE | Admit: 2018-04-03 | Discharge: 2018-04-03 | Disposition: A | Discharge status: Home / Self Care | Payer: Medicare Other | Source: Ambulatory Visit | Attending: Radiation Oncology | Admitting: Radiation Oncology

## 2018-04-03 DIAGNOSIS — C61 Malignant neoplasm of prostate: Secondary | ICD-10-CM | POA: Insufficient documentation

## 2018-04-03 DIAGNOSIS — C7951 Secondary malignant neoplasm of bone: Secondary | ICD-10-CM

## 2018-04-03 DIAGNOSIS — C7952 Secondary malignant neoplasm of bone marrow: Secondary | ICD-10-CM

## 2018-04-03 LAB — CBC WITH DIFFERENTIAL (CANCER CENTER ONLY)
BASOS PCT: 1 %
Basophils Absolute: 0 10*3/uL (ref 0.0–0.1)
Eosinophils Absolute: 0.3 10*3/uL (ref 0.0–0.5)
Eosinophils Relative: 6 %
HEMATOCRIT: 39.3 % (ref 38.4–49.9)
Hemoglobin: 13.3 g/dL (ref 13.0–17.1)
LYMPHS ABS: 0.8 10*3/uL — AB (ref 0.9–3.3)
Lymphocytes Relative: 19 %
MCH: 30.7 pg (ref 27.2–33.4)
MCHC: 33.8 g/dL (ref 32.0–36.0)
MCV: 90.8 fL (ref 79.3–98.0)
MONOS PCT: 10 %
Monocytes Absolute: 0.4 10*3/uL (ref 0.1–0.9)
NEUTROS ABS: 2.8 10*3/uL (ref 1.5–6.5)
NEUTROS PCT: 64 %
Platelet Count: 222 10*3/uL (ref 140–400)
RBC: 4.33 MIL/uL (ref 4.20–5.82)
RDW: 13.7 % (ref 11.0–14.6)
WBC Count: 4.3 10*3/uL (ref 4.0–10.3)

## 2018-04-03 LAB — CMP (CANCER CENTER ONLY)
ALT: 8 U/L (ref 0–44)
ANION GAP: 7 (ref 5–15)
AST: 14 U/L — ABNORMAL LOW (ref 15–41)
Albumin: 3.8 g/dL (ref 3.5–5.0)
Alkaline Phosphatase: 154 U/L — ABNORMAL HIGH (ref 38–126)
BILIRUBIN TOTAL: 0.5 mg/dL (ref 0.3–1.2)
BUN: 19 mg/dL (ref 8–23)
CO2: 26 mmol/L (ref 22–32)
Calcium: 8.7 mg/dL — ABNORMAL LOW (ref 8.9–10.3)
Chloride: 106 mmol/L (ref 98–111)
Creatinine: 1.09 mg/dL (ref 0.61–1.24)
GFR, Est AFR Am: >60 mL/min (ref >60)
Glucose, Bld: 97 mg/dL (ref 70–99)
Potassium: 4.7 mmol/L (ref 3.5–5.1)
Sodium: 139 mmol/L (ref 135–145)
TOTAL PROTEIN: 6.6 g/dL (ref 6.5–8.1)

## 2018-04-04 ENCOUNTER — Telehealth: Payer: Self-pay | Admitting: *Deleted

## 2018-04-04 LAB — PROSTATE-SPECIFIC AG, SERUM (LABCORP): PROSTATE SPECIFIC AG, SERUM: 184 ng/mL — AB (ref 0.0–4.0)

## 2018-04-04 NOTE — Telephone Encounter (Signed)
Spoke with patient, gave results of last PSA 

## 2018-04-04 NOTE — Telephone Encounter (Signed)
-----   Message from Wyatt Portela, MD sent at 04/04/2018  8:13 AM EDT ----- Please let him know his PSA is down. Will discuss further next visit with me next week.

## 2018-04-07 ENCOUNTER — Inpatient Hospital Stay: Payer: Medicare Other

## 2018-04-07 ENCOUNTER — Other Ambulatory Visit: Payer: Self-pay | Admitting: Oncology

## 2018-04-07 DIAGNOSIS — C61 Malignant neoplasm of prostate: Secondary | ICD-10-CM

## 2018-04-08 DIAGNOSIS — C7951 Secondary malignant neoplasm of bone: Secondary | ICD-10-CM | POA: Diagnosis not present

## 2018-04-08 DIAGNOSIS — C61 Malignant neoplasm of prostate: Secondary | ICD-10-CM | POA: Diagnosis not present

## 2018-04-09 ENCOUNTER — Inpatient Hospital Stay: Payer: Medicare Other | Attending: Oncology | Admitting: Oncology

## 2018-04-09 ENCOUNTER — Telehealth: Payer: Self-pay | Admitting: Oncology

## 2018-04-09 VITALS — BP 130/67 | HR 72 | Temp 97.7°F | Resp 18 | Ht 74.0 in | Wt 214.4 lb

## 2018-04-09 DIAGNOSIS — I10 Essential (primary) hypertension: Secondary | ICD-10-CM | POA: Diagnosis not present

## 2018-04-09 DIAGNOSIS — E0590 Thyrotoxicosis, unspecified without thyrotoxic crisis or storm: Secondary | ICD-10-CM | POA: Diagnosis not present

## 2018-04-09 DIAGNOSIS — C61 Malignant neoplasm of prostate: Secondary | ICD-10-CM | POA: Diagnosis not present

## 2018-04-09 DIAGNOSIS — C7951 Secondary malignant neoplasm of bone: Secondary | ICD-10-CM | POA: Diagnosis not present

## 2018-04-09 MED FILL — XTANDI 40 MG CAPSULE: 40 | 30 days supply | Qty: 120 | Fill #0

## 2018-04-09 NOTE — Telephone Encounter (Signed)
Appts scheduled avs/calendar printed per 9/18 los °

## 2018-04-09 NOTE — Progress Notes (Signed)
Hematology and Oncology Follow Up Visit  Edward Bailey [id number] 12/15/1935 82 y.o. 04/09/2018 12:56 PM [name], MDGriffin, [name], MD   Principle Diagnosis: 82 year old man with prostate cancer diagnosed in 1999 with Gleason score 3+4 = 7 and a PSA of 4.  He has castration-resistant disease with bone involvement.     Prior Therapy:  He is status post prostatectomy and found to have stage T3b disease. He received salvage radiation therapy for a rise in his PSA.  He developed biochemical relapse and was treated with androgen depravation intermittently between 2010 to about 2015.  He developed castration resistant disease in 2015.  Ketoconazole at 200 mg twice a day and prednisone 5 mg daily. Therapy discontinued on 05/24/2016 because of progression of disease. Zytiga 1000 mg daily with prednisone at 5 mg daily started in November 2017. Xtandi 160 mg daily started in April 2019 therapy discontinued due to progression of disease and July 2019.  Current therapy:  [name] started in July 2019.  He completed 2 months of therapy. Xtandi 160 mg resumed in August 2019 because of a rapid rise in his PSA.  Interim History:  [name] is here for a follow-up visit.  Since her last visit, he continues to tolerate Xofigo without any complications.  He denies any excessive fatigue or tiredness.  He denies any infusion related complications.  He also resumed Xtandi at 160 mg for the last 4 weeks without complications.  He denies any bone pain or pathological fractures.  He denies any falls or syncope.  He denies any hematuria or changes in urination.  His quality of life and performance status remain excellent his appetite is reasonable.   He does not report any headaches, blurred vision or syncope.  He denies any dizziness or lethargy.  He does not report any fevers, chills or sweats. He does not report any chest pain, shortness of breath, leg edema.  He denies any orthopnea or leg edema.  He does  not report any wheezing, cough or hemoptysis.  He does not report any changes in his bowel habits.  He denies any hematochezia or melena.  He does not report any frequency urgency or hesitancy.  He does not report any arthralgias or myalgias.  He is not report any lymphadenopathy or petechiae.  He does not report any lymphadenopathy or easy bruising.  He denies any anxiety or depression.  The rest of his review of systems is negative.      Medications: I have reviewed the patient's current medications.  Current Outpatient Medications  Medication Sig Dispense Refill  . aspirin (ASPIRIN EC) 81 MG EC tablet Take 81 mg by mouth daily. Swallow whole.    Marland Kitchen azelastine (ASTELIN) 0.1 % nasal spray Place 1-2 sprays into both nostrils daily as needed. allergies  5  . cetirizine (ZYRTEC) 10 MG tablet Take 10 mg by mouth daily as needed for allergies. As needed    . fluticasone (FLONASE) 50 MCG/ACT nasal spray Place 1 spray into both nostrils daily as needed. Allergies  5  . levothyroxine (SYNTHROID, LEVOTHROID) 112 MCG tablet TK 1 T PO QD IN THE MORNING OES  12  . levothyroxine (SYNTHROID, LEVOTHROID) 125 MCG tablet Alternates with 112 mcg  9  . Multiple Vitamins-Minerals (MULTIVITAMIN PO) Take 1 capsule by mouth daily.    [name] 40 MG capsule TAKE 4 CAPSULES (160 MG TOTAL) BY MOUTH DAILY. 120 capsule 0   No current facility-administered medications for this visit.  Allergies:  Allergies  Allergen Reactions  . Amoxicillin Rash    Has patient had a PCN reaction causing immediate rash, facial/tongue/throat swelling, SOB or lightheadedness with hypotension: No Has patient had a PCN reaction causing severe rash involving mucus membranes or skin necrosis: No Has patient had a PCN reaction that required hospitalization No Has patient had a PCN reaction occurring within the last 10 years: Yes If all of the above answers are "NO", then may proceed with Cephalosporin use.     Past Medical History,  Surgical history, Social history, and Family History were reviewed and updated.   Physical Exam: Blood pressure 130/67, pulse 72, temperature 97.7 F (36.5 C), temperature source Oral, resp. rate 18, height 6\' 2"  (1.88 m), weight 214 lb 6.4 oz (97.3 kg), SpO2 100 %.   ECOG: 1   General appearance: Comfortable appearing without any discomfort Head: Normocephalic without any trauma Oropharynx: Mucous membranes are moist and pink without any thrush or ulcers. Eyes: Pupils are equal and round reactive to light. Lymph nodes: No cervical, supraclavicular, inguinal or axillary lymphadenopathy.   Heart:regular rate and rhythm.  S1 and S2 without leg edema. Lung: Clear without any rhonchi or wheezes.  No dullness to percussion. Abdomin: Soft, nontender, nondistended with good bowel sounds.  No hepatosplenomegaly. Musculoskeletal: No joint deformity or effusion.  Full range of motion noted. Neurological: No deficits noted on motor, sensory and deep tendon reflex exam. Skin: No petechial rash or dryness.  Appeared moist.    Lab Results: Lab Results  Component Value Date   WBC 4.3 04/03/2018   HGB 13.3 04/03/2018   HCT 39.3 04/03/2018   MCV 90.8 04/03/2018   PLT 222 04/03/2018     Chemistry      Component Value Date/Time   NA 139 04/03/2018 1225   NA 139 06/07/2017 1057   K 4.7 04/03/2018 1225   K 3.6 06/07/2017 1057   CL 106 04/03/2018 1225   CO2 26 04/03/2018 1225   CO2 26 06/07/2017 1057   BUN 19 04/03/2018 1225   BUN 18.5 06/07/2017 1057   CREATININE 1.09 04/03/2018 1225   CREATININE 1.2 06/07/2017 1057      Component Value Date/Time   CALCIUM 8.7 (L) 04/03/2018 1225   CALCIUM 9.0 06/07/2017 1057   ALKPHOS 154 (H) 04/03/2018 1225   ALKPHOS 112 06/07/2017 1057   AST 14 (L) 04/03/2018 1225   AST 14 06/07/2017 1057   ALT 8 04/03/2018 1225   ALT 12 06/07/2017 1057   BILITOT 0.5 04/03/2018 1225   BILITOT 0.66 06/07/2017 1057         Results for [name], [name]  (MRN [id number]) as of 04/09/2018 12:29  Ref. Range 03/10/2018 11:41 04/03/2018 12:25  Prostate Specific Ag, Serum Latest Ref Range: 0.0 - 4.0 ng/mL 225.0 (H) 184.0 (H)   EXAM: CT ABDOMEN AND PELVIS WITH CONTRAST  TECHNIQUE: Multidetector CT imaging of the abdomen and pelvis was performed using the standard protocol following bolus administration of intravenous contrast.  CONTRAST:  162mL OMNIPAQUE IOHEXOL 300 MG/ML  SOLN  COMPARISON:  Multiple exams, including 09/10/2017  FINDINGS: Lower chest: Mild scarring anteriorly in the right lower lobe, stable.  Left anterior descending coronary artery atherosclerosis.  Hepatobiliary: Unremarkable  Pancreas: Unremarkable  Spleen: Unremarkable  Adrenals/Urinary Tract: Adrenal glands normal. Mild scarring in the left kidney. Postoperative findings around the urinary bladder related to prostatectomy.  Stomach/Bowel: Mild sigmoid colon diverticulosis.  Vascular/Lymphatic: Aortoiliac atherosclerotic vascular disease. Prior pelvic lymph node dissection. No current  pathologic adenopathy.  Reproductive: Prostatectomy.  Other: No supplemental non-categorized findings.  Musculoskeletal: Notable increase in conspicuity/number of tiny sclerotic osseous metastatic lesions. Other lesions appear enlarged compared to prior. For example, a sclerotic lesion in the left ischium measures 1.1 by 0.7 cm on image 105/2, formerly 0.6 by 0.5 cm. There are innumerable small sclerotic foci scattered throughout the visualized bony structures. The confluent osseous metastatic disease in the vicinity of the left sacroiliac joint is worsened in both the sacrum and iliac bone.  IMPRESSION: 1. New and larger innumerable scattered sclerotic metastatic lesions to the skeleton. Sometimes increased sclerosis can simply be a reflection of response to therapy, but in this case the larger size and increased number of lesions compared to 09/10/2017  is felt to favor progressive metastatic disease. 2. No current active soft tissue tumor is observed. 3. Other imaging findings of potential clinical significance: Aortic Atherosclerosis (ICD10-I70.0). Coronary atherosclerosis. Mild scarring in the left kidney. Prostatectomy. Mild sigmoid colon diverticulosis.   Impression and Plan:  82 year old man with  1.  Castration-resistant prostate cancer with documented disease to the bone in 2017 after he was initially diagnosed in 1999.   He is currently on Xofigo which she has tolerated very well.  The plan is to complete 6 cycles of therapy on a monthly basis before consideration for any additional therapy.  His CT scan obtained on 03/18/2018 was personally reviewed and discussed with patient today which showed no visceral metastasis.  His disease is predominantly into the bone and his PSA is declining.  Risks and benefits of continuing both those therapies were reviewed today and he will continue Xtandi at 160 mg in addition to [place].      2. Androgen depravation: He is currently on Lupron which will be continued under the care of Dr. Diona Fanti.  No issues reported to this medication at this time.  3.  Bone directed therapy: He remains on Xgeva which she has tolerated as well and receives it under the care of Dr. Diona Fanti.  4. Hyperthyroidism: He remains on Synthroid without any issues or complications.  5.  Hypertension: His blood pressure will be monitored on Xtandi and his blood pressure remains adequate at this time.  6. Followup: We will be in 8 weeks sooner if needed to.    25 minutes was spent with the patient face-to-face today.  More than 50% of time was dedicated to reviewing the natural course of his disease, reviewing imaging studies and managing complications related to therapy.Zola Button, MD 9/18/201912:56 PM

## 2018-04-10 ENCOUNTER — Encounter (HOSPITAL_COMMUNITY)
Admission: RE | Admit: 2018-04-10 | Discharge: 2018-04-10 | Disposition: A | Discharge status: Home / Self Care | Payer: Medicare Other | Source: Ambulatory Visit | Attending: Radiation Oncology | Admitting: Radiation Oncology

## 2018-04-10 ENCOUNTER — Ambulatory Visit
Admission: RE | Admit: 2018-04-10 | Payer: Medicare Other | Source: Ambulatory Visit | Attending: Radiation Oncology | Admitting: Radiation Oncology

## 2018-04-10 DIAGNOSIS — C7951 Secondary malignant neoplasm of bone: Secondary | ICD-10-CM | POA: Diagnosis not present

## 2018-04-10 DIAGNOSIS — C61 Malignant neoplasm of prostate: Secondary | ICD-10-CM | POA: Insufficient documentation

## 2018-04-10 MED ORDER — RADIUM RA 223 DICHLORIDE 30 MCCI/ML IV SOLN
143.0800 | Freq: Once | INTRAVENOUS | Status: AC
  Administered 2018-04-10: 143.08 via INTRAVENOUS

## 2018-04-10 NOTE — Progress Notes (Signed)
  Radiation Oncology         [phone] ________________________________  Name: Edward Bailey MRN: [id number]  Date: 04/10/2018  DOB: 1936/05/09  Radium-223 Infusion Note  Diagnosis:  Castration resistant prostate cancer with painful bone involvement  Current Infusion:    3  Planned Infusions:  6  Narrative: Edward Bailey presented to nuclear medicine for treatment. His most recent blood counts were reviewed.  He remains a good candidate to proceed with Ra-223.  The patient was situated in an infusion suite with a contact barrier placed under his arm. Intravenous access was established, using sterile technique, and a normal saline infusion from a syringe was started.  Micro-dosimetry:  The prescribed radiation activity was assayed and confirmed to be within specified tolerance.  Special Treatment Procedure - Infusion:  The nuclear medicine technologist and I personally verified the dose activity to be delivered as specified in the written directive, and verified the patient identification via 2 separate methods.  The syringe containing the dose was attached to a 3 way stopcock, and then the valve was opened to the patient, and the dose delivered over a minute. No complications were noted.  The total administered dose was 147.6 microcuries in a volume of 5.42 cc.   A saline flush of the line and the syringe that contained the isotope was then performed.  The residual radioactivity in the syringe was 4.52 microcuries, so the actual infused isotope activity was 143.08 microcuries.   Pressure was applied to the venipuncture site, and a compression bandage placed.   Radiation Safety personnel were present to perform the discharge survey, as detailed on their documentation.   After a short period of observation, the patient had his IV removed.  Impression:  The patient tolerated his infusion relatively well.  Plan:  The patient will return in one month for ongoing care.      ________________________________  Sheral Apley. Tammi Klippel, M.D.  This document serves as a record of services personally performed by Tyler Pita, MD. It was created on his behalf by Rae Lips, a trained medical scribe. The creation of this record is based on the scribe's personal observations and the provider's statements to them. This document has been checked and approved by the attending provider.

## 2018-04-14 ENCOUNTER — Ambulatory Visit: Payer: Medicare Other

## 2018-04-17 ENCOUNTER — Ambulatory Visit (HOSPITAL_COMMUNITY): Payer: Medicare Other

## 2018-04-21 ENCOUNTER — Other Ambulatory Visit: Payer: Self-pay | Admitting: Radiation Oncology

## 2018-04-21 ENCOUNTER — Ambulatory Visit (HOSPITAL_COMMUNITY): Payer: Medicare Other

## 2018-04-21 DIAGNOSIS — C61 Malignant neoplasm of prostate: Secondary | ICD-10-CM

## 2018-04-21 DIAGNOSIS — C7951 Secondary malignant neoplasm of bone: Secondary | ICD-10-CM

## 2018-04-28 DIAGNOSIS — C61 Malignant neoplasm of prostate: Secondary | ICD-10-CM | POA: Diagnosis not present

## 2018-04-28 DIAGNOSIS — I10 Essential (primary) hypertension: Secondary | ICD-10-CM | POA: Diagnosis not present

## 2018-04-28 DIAGNOSIS — G4731 Primary central sleep apnea: Secondary | ICD-10-CM | POA: Diagnosis not present

## 2018-04-30 ENCOUNTER — Other Ambulatory Visit: Payer: Self-pay | Admitting: Oncology

## 2018-04-30 DIAGNOSIS — C61 Malignant neoplasm of prostate: Secondary | ICD-10-CM

## 2018-04-30 NOTE — Addendum Note (Signed)
Encounter addended by: Tyler Pita, MD on: 04/30/2018 6:17 PM  Actions taken: Medication List reviewed, Problem List reviewed, Allergies reviewed, Sign clinical note

## 2018-05-05 MED FILL — XTANDI 40 MG CAPSULE: 40 | 30 days supply | Qty: 120 | Fill #0

## 2018-05-13 DIAGNOSIS — C7951 Secondary malignant neoplasm of bone: Secondary | ICD-10-CM | POA: Diagnosis not present

## 2018-05-13 DIAGNOSIS — C61 Malignant neoplasm of prostate: Secondary | ICD-10-CM | POA: Diagnosis not present

## 2018-05-14 ENCOUNTER — Telehealth: Payer: Self-pay | Admitting: *Deleted

## 2018-05-14 ENCOUNTER — Other Ambulatory Visit: Payer: Self-pay | Admitting: Radiation Oncology

## 2018-05-14 DIAGNOSIS — C7951 Secondary malignant neoplasm of bone: Secondary | ICD-10-CM

## 2018-05-14 DIAGNOSIS — C7952 Secondary malignant neoplasm of bone marrow: Principal | ICD-10-CM

## 2018-05-14 NOTE — Telephone Encounter (Signed)
CALLED PATIENT TO REMIND OF LAB AND WEIGHT ON 05-15-18 @ 12 PM, LVM FOR A RETURN CALL

## 2018-05-15 ENCOUNTER — Ambulatory Visit
Admission: RE | Admit: 2018-05-15 | Discharge: 2018-05-15 | Disposition: A | Discharge status: Home / Self Care | Payer: Medicare Other | Source: Ambulatory Visit | Attending: Radiation Oncology | Admitting: Radiation Oncology

## 2018-05-15 DIAGNOSIS — C61 Malignant neoplasm of prostate: Secondary | ICD-10-CM | POA: Insufficient documentation

## 2018-05-15 DIAGNOSIS — C7951 Secondary malignant neoplasm of bone: Secondary | ICD-10-CM

## 2018-05-15 DIAGNOSIS — C7952 Secondary malignant neoplasm of bone marrow: Secondary | ICD-10-CM

## 2018-05-15 LAB — CBC WITH DIFFERENTIAL (CANCER CENTER ONLY)
Abs Immature Granulocytes: 0.03 10*3/uL (ref 0.00–0.07)
BASOS PCT: 1 %
Basophils Absolute: 0 10*3/uL (ref 0.0–0.1)
EOS ABS: 0.3 10*3/uL (ref 0.0–0.5)
EOS PCT: 4 %
HCT: 39.2 % (ref 39.0–52.0)
Hemoglobin: 13.2 g/dL (ref 13.0–17.0)
Immature Granulocytes: 1 %
Lymphocytes Relative: 13 %
Lymphs Abs: 0.8 10*3/uL (ref 0.7–4.0)
MCH: 30.8 pg (ref 26.0–34.0)
MCHC: 33.7 g/dL (ref 30.0–36.0)
MCV: 91.4 fL (ref 80.0–100.0)
Monocytes Absolute: 0.6 10*3/uL (ref 0.1–1.0)
Monocytes Relative: 10 %
NRBC: 0 % (ref 0.0–0.2)
Neutro Abs: 4.4 10*3/uL (ref 1.7–7.7)
Neutrophils Relative %: 71 %
PLATELETS: 244 10*3/uL (ref 150–400)
RBC: 4.29 MIL/uL (ref 4.22–5.81)
RDW: 13.9 % (ref 11.5–15.5)
WBC: 6.1 10*3/uL (ref 4.0–10.5)

## 2018-05-19 DIAGNOSIS — G4733 Obstructive sleep apnea (adult) (pediatric): Secondary | ICD-10-CM | POA: Diagnosis not present

## 2018-05-21 ENCOUNTER — Telehealth: Payer: Self-pay | Admitting: *Deleted

## 2018-05-21 NOTE — Telephone Encounter (Signed)
CALLED PATIENT TO REMIND OF XOFIGO INJ. FOR 05-22-18 - ARRIVAL TIME - 11:15 AM @ [facility], LVM FOR A RETURN CALL

## 2018-05-22 ENCOUNTER — Encounter (HOSPITAL_COMMUNITY)
Admission: RE | Admit: 2018-05-22 | Discharge: 2018-05-22 | Disposition: A | Discharge status: Home / Self Care | Payer: Medicare Other | Source: Ambulatory Visit | Attending: Radiation Oncology | Admitting: Radiation Oncology

## 2018-05-22 DIAGNOSIS — C7951 Secondary malignant neoplasm of bone: Secondary | ICD-10-CM

## 2018-05-22 DIAGNOSIS — C61 Malignant neoplasm of prostate: Secondary | ICD-10-CM | POA: Diagnosis not present

## 2018-05-22 MED ORDER — RADIUM RA 223 DICHLORIDE 30 MCCI/ML IV SOLN
147.9000 | Freq: Once | INTRAVENOUS | Status: AC
  Administered 2018-05-22: 147.9 via INTRAVENOUS

## 2018-05-22 NOTE — Progress Notes (Signed)
  Radiation Oncology         [phone] ________________________________  Name: Edward Bailey MRN: [id number]  Date: 05/22/2018  DOB: [birth year]  Radium-223 Infusion Note  Diagnosis:  Castration resistant prostate cancer with painful bone involvement  Current Infusion:    4  Planned Infusions:  6  Narrative: Mr. AMALIO LOE presented to nuclear medicine for treatment. His most recent blood counts were reviewed.  He remains a good candidate to proceed with Ra-223.  The patient was situated in an infusion suite with a contact barrier placed under his arm. Intravenous access was established, using sterile technique, and a normal saline infusion from a syringe was started.  Micro-dosimetry:  The prescribed radiation activity was assayed and confirmed to be within specified tolerance.  Special Treatment Procedure - Infusion:  The nuclear medicine technologist and I personally verified the dose activity to be delivered as specified in the written directive, and verified the patient identification via 2 separate methods.  The syringe containing the dose was attached to a 3 way stopcock, and then the valve was opened to the patient, and the dose delivered over a minute. No complications were noted.  The total administered dose was 151.5 microcuries in a volume of 5 cc.   A saline flush of the line and the syringe that contained the isotope was then performed.  The residual radioactivity in the syringe was 3.6 microcuries, so the actual infused isotope activity was 147.9 microcuries.   Pressure was applied to the venipuncture site, and a compression bandage placed.   Radiation Safety personnel were present to perform the discharge survey, as detailed on their documentation.   After a short period of observation, the patient had his IV removed.  Impression:  The patient tolerated his infusion relatively well.  Plan:  The patient will return in one week for ongoing care.      ________________________________  Sheral Apley. Tammi Klippel, M.D.   This document serves as a record of services personally performed by Tyler Pita, MD. It was created on his behalf by Wilburn Mylar, a trained medical scribe. The creation of this record is based on the scribe's personal observations and the provider's statements to them. This document has been checked and approved by the attending provider.

## 2018-05-23 ENCOUNTER — Other Ambulatory Visit: Payer: Self-pay | Admitting: Radiation Oncology

## 2018-05-23 ENCOUNTER — Telehealth: Payer: Self-pay | Admitting: *Deleted

## 2018-05-23 DIAGNOSIS — C61 Malignant neoplasm of prostate: Secondary | ICD-10-CM

## 2018-05-23 DIAGNOSIS — C7951 Secondary malignant neoplasm of bone: Secondary | ICD-10-CM

## 2018-05-23 NOTE — Telephone Encounter (Signed)
CALLED PATIENT TO INFORM OF LAB AND XOFIGO INJ., LVM FOR A RETURN CALL

## 2018-05-26 ENCOUNTER — Telehealth: Payer: Self-pay | Admitting: *Deleted

## 2018-05-26 NOTE — Telephone Encounter (Signed)
RETURNED PATIENT'S PHONE CALL, LVM FOR A RETURN CALL 

## 2018-05-28 ENCOUNTER — Inpatient Hospital Stay: Payer: Medicare Other | Attending: Oncology

## 2018-05-28 DIAGNOSIS — C61 Malignant neoplasm of prostate: Secondary | ICD-10-CM

## 2018-05-28 DIAGNOSIS — E0590 Thyrotoxicosis, unspecified without thyrotoxic crisis or storm: Secondary | ICD-10-CM | POA: Diagnosis not present

## 2018-05-28 DIAGNOSIS — I10 Essential (primary) hypertension: Secondary | ICD-10-CM | POA: Diagnosis not present

## 2018-05-28 DIAGNOSIS — C7951 Secondary malignant neoplasm of bone: Secondary | ICD-10-CM | POA: Insufficient documentation

## 2018-05-28 LAB — CBC WITH DIFFERENTIAL (CANCER CENTER ONLY)
ABS IMMATURE GRANULOCYTES: 0.02 10*3/uL (ref 0.00–0.07)
BASOS ABS: 0 10*3/uL (ref 0.0–0.1)
Basophils Relative: 1 %
EOS ABS: 0.2 10*3/uL (ref 0.0–0.5)
Eosinophils Relative: 4 %
HEMATOCRIT: 40.4 % (ref 39.0–52.0)
HEMOGLOBIN: 13.3 g/dL (ref 13.0–17.0)
IMMATURE GRANULOCYTES: 0 %
LYMPHS ABS: 0.5 10*3/uL — AB (ref 0.7–4.0)
LYMPHS PCT: 10 %
MCH: 30.6 pg (ref 26.0–34.0)
MCHC: 32.9 g/dL (ref 30.0–36.0)
MCV: 93.1 fL (ref 80.0–100.0)
MONOS PCT: 10 %
Monocytes Absolute: 0.5 10*3/uL (ref 0.1–1.0)
NEUTROS ABS: 3.9 10*3/uL (ref 1.7–7.7)
NEUTROS PCT: 75 %
NRBC: 0 % (ref 0.0–0.2)
Platelet Count: 265 10*3/uL (ref 150–400)
RBC: 4.34 MIL/uL (ref 4.22–5.81)
RDW: 14.1 % (ref 11.5–15.5)
WBC Count: 5.2 10*3/uL (ref 4.0–10.5)

## 2018-05-28 LAB — CMP (CANCER CENTER ONLY)
ALBUMIN: 3.6 g/dL (ref 3.5–5.0)
ALK PHOS: 152 U/L — AB (ref 38–126)
ALT: 8 U/L (ref 0–44)
AST: 14 U/L — ABNORMAL LOW (ref 15–41)
Anion gap: 8 (ref 5–15)
BUN: 19 mg/dL (ref 8–23)
CHLORIDE: 106 mmol/L (ref 98–111)
CO2: 24 mmol/L (ref 22–32)
Calcium: 8.8 mg/dL — ABNORMAL LOW (ref 8.9–10.3)
Creatinine: 1.07 mg/dL (ref 0.61–1.24)
GFR, Est AFR Am: >60 mL/min (ref >60)
GFR, Estimated: >60 mL/min (ref >60)
GLUCOSE: 90 mg/dL (ref 70–99)
POTASSIUM: 4.3 mmol/L (ref 3.5–5.1)
SODIUM: 138 mmol/L (ref 135–145)
Total Bilirubin: 0.4 mg/dL (ref 0.3–1.2)
Total Protein: 6.9 g/dL (ref 6.5–8.1)

## 2018-05-29 LAB — PROSTATE-SPECIFIC AG, SERUM (LABCORP): PROSTATE SPECIFIC AG, SERUM: 249 ng/mL — AB (ref 0.0–4.0)

## 2018-06-04 ENCOUNTER — Telehealth: Payer: Self-pay | Admitting: Oncology

## 2018-06-04 ENCOUNTER — Inpatient Hospital Stay: Payer: Medicare Other | Admitting: Oncology

## 2018-06-04 VITALS — BP 121/67 | HR 67 | Temp 97.7°F | Resp 17 | Ht 74.0 in | Wt 217.5 lb

## 2018-06-04 DIAGNOSIS — I10 Essential (primary) hypertension: Secondary | ICD-10-CM | POA: Diagnosis not present

## 2018-06-04 DIAGNOSIS — E0590 Thyrotoxicosis, unspecified without thyrotoxic crisis or storm: Secondary | ICD-10-CM

## 2018-06-04 DIAGNOSIS — C7951 Secondary malignant neoplasm of bone: Secondary | ICD-10-CM

## 2018-06-04 DIAGNOSIS — C61 Malignant neoplasm of prostate: Secondary | ICD-10-CM

## 2018-06-04 NOTE — Telephone Encounter (Signed)
Scheduled appt per 11/13 los- gave patient aVS and calender per los.  

## 2018-06-04 NOTE — Progress Notes (Signed)
Hematology and Oncology Follow Up Visit  Edward Bailey [id number] [birth year] 82 y.o. 06/04/2018 10:02 AM Edward Bailey, MDGriffin, [name], MD   Principle Diagnosis: 82 year old man with castration-resistant prostate cancer with disease to the bone documented in 2017.  He was initially diagnosed in 1999 with Gleason score 3+4 = 7 and a PSA of 4.     Prior Therapy:  He is status post prostatectomy and found to have stage T3b disease. He received salvage radiation therapy for a rise in his PSA.  He developed biochemical relapse and was treated with androgen depravation intermittently between 2010 to about 2015.  He developed castration resistant disease in 2015.  Ketoconazole at 200 mg twice a day and prednisone 5 mg daily. Therapy discontinued on 05/24/2016 because of progression of disease. Zytiga 1000 mg daily with prednisone at 5 mg daily started in November 2017. Xtandi 160 mg daily started in April 2019 therapy discontinued due to progression of disease and July 2019.  Current therapy:  [name] started in July 2019.  He completed 4 treatments out of total 6 total treatments. Xtandi 160 mg resumed in August 2019 because of a rapid rise in his PSA.  Interim History:  Edward Bailey returns today for a repeat evaluation.  Since the last visit, he reports no major changes in his health.  He does report mild intermittent lower back pain that is manageable with taking aspirin.  He ambulates without any difficulties and has not had any falls or unsteadiness.  He has tolerated Xtandi without any excessive fatigue or tiredness.  He denies any GI complications.  He continues to tolerate Xofigo without any issues at this time.  His quality of life and performance status remains unchanged.   He does not report any headaches, blurred vision or syncope.  He denies any alteration in mental status or confusion.  He does not report any fevers, chills or sweats. He does not report any chest pain, shortness of  breath, leg edema.  He denies any orthopnea or dyspnea on exertion.  He does not report any wheezing, cough or hemoptysis.  He does not report any constipation or diarrhea.  He denies any hematochezia or melena.  He does not report any frequency urgency or hesitancy.  He does not report any bone pain or pathological fractures.  He is not report any bleeding or clotting tendency.  He does not report any skin rashes or lesions.  He denies any mood changes.  The rest of his review of systems is negative.      Medications: I have reviewed the patient's current medications.  Current Outpatient Medications  Medication Sig Dispense Refill  . aspirin (ASPIRIN EC) 81 MG EC tablet Take 81 mg by mouth daily. Swallow whole.    Marland Kitchen azelastine (ASTELIN) 0.1 % nasal spray Place 1-2 sprays into both nostrils daily as needed. allergies  5  . cetirizine (ZYRTEC) 10 MG tablet Take 10 mg by mouth daily as needed for allergies. As needed    . fluticasone (FLONASE) 50 MCG/ACT nasal spray Place 1 spray into both nostrils daily as needed. Allergies  5  . levothyroxine (SYNTHROID, LEVOTHROID) 112 MCG tablet TK 1 T PO QD IN THE MORNING OES  12  . levothyroxine (SYNTHROID, LEVOTHROID) 125 MCG tablet Alternates with 112 mcg  9  . Multiple Vitamins-Minerals (MULTIVITAMIN PO) Take 1 capsule by mouth daily.    [name] 40 MG capsule TAKE 4 CAPSULES (160 MG TOTAL) BY MOUTH DAILY. 120 capsule 0  No current facility-administered medications for this visit.      Allergies:  Allergies  Allergen Reactions  . Amoxicillin Rash    Has patient had a PCN reaction causing immediate rash, facial/tongue/throat swelling, SOB or lightheadedness with hypotension: No Has patient had a PCN reaction causing severe rash involving mucus membranes or skin necrosis: No Has patient had a PCN reaction that required hospitalization No Has patient had a PCN reaction occurring within the last 10 years: Yes If all of the above answers are "NO",  then may proceed with Cephalosporin use.     Past Medical History, Surgical history, Social history, and Family History were reviewed and updated.   Physical Exam:  Blood pressure 121/67, pulse 67, temperature 97.7 F (36.5 C), temperature source Oral, resp. rate 17, height 6\' 2"  (1.88 m), weight 217 lb 8 oz (98.7 kg), SpO2 100 %.   ECOG: 1   General appearance: Alert, awake without any distress. Head: Atraumatic without abnormalities Oropharynx: Without any thrush or ulcers. Eyes: No scleral icterus. Lymph nodes: No lymphadenopathy noted in the cervical, supraclavicular, or axillary nodes Heart:regular rate and rhythm, without any murmurs or gallops.   Lung: Clear to auscultation without any rhonchi, wheezes or dullness to percussion. Abdomin: Soft, nontender without any shifting dullness or ascites. Musculoskeletal: No clubbing or cyanosis. Neurological: No motor or sensory deficits. Skin: No rashes or lesions.    Lab Results: Lab Results  Component Value Date   WBC 5.2 05/28/2018   HGB 13.3 05/28/2018   HCT 40.4 05/28/2018   MCV 93.1 05/28/2018   PLT 265 05/28/2018     Chemistry      Component Value Date/Time   NA 138 05/28/2018 1010   NA 139 06/07/2017 1057   K 4.3 05/28/2018 1010   K 3.6 06/07/2017 1057   CL 106 05/28/2018 1010   CO2 24 05/28/2018 1010   CO2 26 06/07/2017 1057   BUN 19 05/28/2018 1010   BUN 18.5 06/07/2017 1057   CREATININE 1.07 05/28/2018 1010   CREATININE 1.2 06/07/2017 1057      Component Value Date/Time   CALCIUM 8.8 (L) 05/28/2018 1010   CALCIUM 9.0 06/07/2017 1057   ALKPHOS 152 (H) 05/28/2018 1010   ALKPHOS 112 06/07/2017 1057   AST 14 (L) 05/28/2018 1010   AST 14 06/07/2017 1057   ALT 8 05/28/2018 1010   ALT 12 06/07/2017 1057   BILITOT 0.4 05/28/2018 1010   BILITOT 0.66 06/07/2017 1057       Results for Edward Bailey (MRN [id number]) as of 06/04/2018 09:25  Ref. Range 04/03/2018 12:25 05/28/2018 10:10  Prostate  Specific Ag, Serum Latest Ref Range: 0.0 - 4.0 ng/mL 184.0 (H) 249.0 (H)      Impression and Plan:  82 year old man with  1.  Advanced prostate cancer with disease to the bone that is currently castration-resistant since 2017.  He remains on Xofigo which she has tolerated well and completed 4 treatment out of scheduled 6.  His laboratory data were personally reviewed today and his disease status was updated.  His PSA continues to show rise however his alkaline phosphatase to decline since the start of Xofigo.  Risks and benefits of continuing this current therapy was discussed today.  Different salvage therapies were discussed today again in details.  Specifically today we have discussed the role of systemic chemotherapy which is possibly next after progression of disease.  Complication associated with chemotherapy was reiterated which include nausea, vomiting, myelosuppression as well as the  benefit was also emphasized.  Improvement quality of life decreasing bone pain and improvement in overall survival has been documented with Taxotere chemotherapy.  After discussion today, I have elected to continue with Xofigo to complete 6 infusions and we will restage him in January 2020 and will discuss the next steps afterwards.      2. Androgen depravation: I recommended continuing Lupron indefinitely which she is currently receiving under the care of Dr. Diona Fanti.  3.  Bone directed therapy: He has tolerated Xgeva without any issues.  I recommended continuing this indefinitely under the care of Dr. Diona Fanti.   4. Hyperthyroidism: Recent complications at this time.  5.  Hypertension: No issues reported related to Xtandi at this time.  His blood pressure remains within normal range.  6. Followup: We will be in 8 weeks sooner if needed to.    25 minutes was spent with the patient face-to-face today.  More than 50% of time was dedicated to discussing his disease status, treatment options and  managing complications related to therapy.  Zola Button, MD 11/13/201910:02 AM

## 2018-06-06 ENCOUNTER — Telehealth: Payer: Self-pay | Admitting: *Deleted

## 2018-06-06 ENCOUNTER — Other Ambulatory Visit: Payer: Self-pay | Admitting: Radiation Oncology

## 2018-06-06 DIAGNOSIS — C61 Malignant neoplasm of prostate: Secondary | ICD-10-CM

## 2018-06-06 NOTE — Telephone Encounter (Signed)
CALLED PATIENT TO REMIND OF LAB APPT. FOR 06-09-18 @ 12 PM, LVM FOR A RETURN CALL

## 2018-06-09 ENCOUNTER — Ambulatory Visit
Admission: RE | Admit: 2018-06-09 | Discharge: 2018-06-09 | Disposition: A | Discharge status: Home / Self Care | Payer: Medicare Other | Source: Ambulatory Visit | Attending: Radiation Oncology | Admitting: Radiation Oncology

## 2018-06-09 DIAGNOSIS — C61 Malignant neoplasm of prostate: Secondary | ICD-10-CM | POA: Diagnosis not present

## 2018-06-09 LAB — CBC WITH DIFFERENTIAL (CANCER CENTER ONLY)
ABS IMMATURE GRANULOCYTES: 0.03 10*3/uL (ref 0.00–0.07)
Basophils Absolute: 0 10*3/uL (ref 0.0–0.1)
Basophils Relative: 1 %
EOS ABS: 0.3 10*3/uL (ref 0.0–0.5)
Eosinophils Relative: 7 %
HCT: 38.1 % — ABNORMAL LOW (ref 39.0–52.0)
Hemoglobin: 12.6 g/dL — ABNORMAL LOW (ref 13.0–17.0)
IMMATURE GRANULOCYTES: 1 %
LYMPHS ABS: 0.8 10*3/uL (ref 0.7–4.0)
Lymphocytes Relative: 17 %
MCH: 30.6 pg (ref 26.0–34.0)
MCHC: 33.1 g/dL (ref 30.0–36.0)
MCV: 92.5 fL (ref 80.0–100.0)
MONOS PCT: 10 %
Monocytes Absolute: 0.5 10*3/uL (ref 0.1–1.0)
NEUTROS ABS: 3 10*3/uL (ref 1.7–7.7)
NEUTROS PCT: 64 %
Platelet Count: 261 10*3/uL (ref 150–400)
RBC: 4.12 MIL/uL — ABNORMAL LOW (ref 4.22–5.81)
RDW: 13.7 % (ref 11.5–15.5)
WBC: 4.6 10*3/uL (ref 4.0–10.5)
nRBC: 0 % (ref 0.0–0.2)

## 2018-06-12 MED FILL — XTANDI 40 MG CAPSULE: 40 | 30 days supply | Qty: 120 | Fill #0

## 2018-06-13 ENCOUNTER — Telehealth: Payer: Self-pay | Admitting: *Deleted

## 2018-06-13 DIAGNOSIS — C7951 Secondary malignant neoplasm of bone: Secondary | ICD-10-CM | POA: Diagnosis not present

## 2018-06-13 DIAGNOSIS — C61 Malignant neoplasm of prostate: Secondary | ICD-10-CM | POA: Diagnosis not present

## 2018-06-13 NOTE — Telephone Encounter (Signed)
CALLED PATIENT TO REMIND OF XOFIGO INJ. FOR 06-16-18 - ARRIVAL TIME- 1:30 PM @ [facility], LVM FOR A RETURN  CALL

## 2018-06-16 ENCOUNTER — Encounter (HOSPITAL_COMMUNITY)
Admission: RE | Admit: 2018-06-16 | Discharge: 2018-06-16 | Disposition: A | Discharge status: Home / Self Care | Payer: Medicare Other | Source: Ambulatory Visit | Attending: Radiation Oncology | Admitting: Radiation Oncology

## 2018-06-16 DIAGNOSIS — C61 Malignant neoplasm of prostate: Secondary | ICD-10-CM | POA: Insufficient documentation

## 2018-06-16 DIAGNOSIS — C7951 Secondary malignant neoplasm of bone: Secondary | ICD-10-CM | POA: Diagnosis not present

## 2018-06-16 MED ORDER — RADIUM RA 223 DICHLORIDE 30 MCCI/ML IV SOLN
146.5900 | Freq: Once | INTRAVENOUS | Status: AC
  Administered 2018-06-16: 146.59 via INTRAVENOUS

## 2018-06-22 NOTE — Addendum Note (Signed)
Encounter addended by: Tyler Pita, MD on: 06/22/2018 3:00 PM  Actions taken: Sign clinical note

## 2018-06-22 NOTE — Addendum Note (Signed)
Encounter addended by: Tyler Pita, MD on: 06/22/2018 3:00 PM  Actions taken: Allergies reviewed

## 2018-06-23 DIAGNOSIS — C7951 Secondary malignant neoplasm of bone: Secondary | ICD-10-CM | POA: Diagnosis not present

## 2018-06-23 DIAGNOSIS — C61 Malignant neoplasm of prostate: Secondary | ICD-10-CM | POA: Diagnosis not present

## 2018-06-27 ENCOUNTER — Telehealth: Payer: Self-pay | Admitting: *Deleted

## 2018-06-27 ENCOUNTER — Other Ambulatory Visit: Payer: Self-pay | Admitting: Radiation Oncology

## 2018-06-27 DIAGNOSIS — C61 Malignant neoplasm of prostate: Secondary | ICD-10-CM

## 2018-06-27 DIAGNOSIS — C7951 Secondary malignant neoplasm of bone: Secondary | ICD-10-CM

## 2018-06-27 NOTE — Telephone Encounter (Signed)
CALLED PATIENT TO INFORM OF LAB AND WEIGHT ON 07-14-18 @ 12 PM @ [place]. ON 07-21-18 - ARRIVAL TIME - 1:45 PM @ [facility], SPOKE WITH PATIENT AND HE IS AWARE OF THESE APPTS.

## 2018-07-07 ENCOUNTER — Other Ambulatory Visit: Payer: Self-pay | Admitting: Oncology

## 2018-07-07 DIAGNOSIS — C61 Malignant neoplasm of prostate: Secondary | ICD-10-CM

## 2018-07-10 MED FILL — XTANDI 40 MG CAPSULE: 40 | 30 days supply | Qty: 120 | Fill #0

## 2018-07-11 ENCOUNTER — Other Ambulatory Visit: Payer: Self-pay | Admitting: Radiation Oncology

## 2018-07-11 ENCOUNTER — Telehealth: Payer: Self-pay | Admitting: *Deleted

## 2018-07-11 DIAGNOSIS — C61 Malignant neoplasm of prostate: Secondary | ICD-10-CM

## 2018-07-11 NOTE — Telephone Encounter (Signed)
CALLED PATIENT TO REMIND OF LAB AND WEIGHT APPT. ON 07-14-18 @ 12 PM @ [place], SPOKE WITH PATIENT AND HE IS AWARE OF THIS APPT.

## 2018-07-14 ENCOUNTER — Ambulatory Visit
Admission: RE | Admit: 2018-07-14 | Discharge: 2018-07-14 | Disposition: A | Discharge status: Home / Self Care | Payer: Medicare Other | Source: Ambulatory Visit | Attending: Radiation Oncology | Admitting: Radiation Oncology

## 2018-07-14 DIAGNOSIS — C61 Malignant neoplasm of prostate: Secondary | ICD-10-CM | POA: Insufficient documentation

## 2018-07-14 LAB — CBC WITH DIFFERENTIAL (CANCER CENTER ONLY)
Abs Immature Granulocytes: 0.05 10*3/uL (ref 0.00–0.07)
Basophils Absolute: 0 10*3/uL (ref 0.0–0.1)
Basophils Relative: 1 %
EOS PCT: 7 %
Eosinophils Absolute: 0.3 10*3/uL (ref 0.0–0.5)
HCT: 37.2 % — ABNORMAL LOW (ref 39.0–52.0)
HEMOGLOBIN: 12.7 g/dL — AB (ref 13.0–17.0)
Immature Granulocytes: 1 %
LYMPHS PCT: 14 %
Lymphs Abs: 0.7 10*3/uL (ref 0.7–4.0)
MCH: 31.4 pg (ref 26.0–34.0)
MCHC: 34.1 g/dL (ref 30.0–36.0)
MCV: 91.9 fL (ref 80.0–100.0)
MONO ABS: 0.6 10*3/uL (ref 0.1–1.0)
Monocytes Relative: 12 %
Neutro Abs: 3.2 10*3/uL (ref 1.7–7.7)
Neutrophils Relative %: 65 %
Platelet Count: 233 10*3/uL (ref 150–400)
RBC: 4.05 MIL/uL — AB (ref 4.22–5.81)
RDW: 13.6 % (ref 11.5–15.5)
WBC: 4.9 10*3/uL (ref 4.0–10.5)
nRBC: 0 % (ref 0.0–0.2)

## 2018-07-18 ENCOUNTER — Telehealth: Payer: Self-pay | Admitting: *Deleted

## 2018-07-18 NOTE — Telephone Encounter (Signed)
Called patient to remind of Xofigo Inj. on 07-21-18 - arrival time - 12:45 pm @ [facility], lvm for a return call

## 2018-07-21 ENCOUNTER — Ambulatory Visit (HOSPITAL_COMMUNITY)
Admission: RE | Admit: 2018-07-21 | Discharge: 2018-07-21 | Disposition: A | Discharge status: Home / Self Care | Payer: Medicare Other | Source: Ambulatory Visit | Attending: Radiation Oncology | Admitting: Radiation Oncology

## 2018-07-21 DIAGNOSIS — C61 Malignant neoplasm of prostate: Secondary | ICD-10-CM | POA: Insufficient documentation

## 2018-07-21 DIAGNOSIS — C7951 Secondary malignant neoplasm of bone: Secondary | ICD-10-CM | POA: Diagnosis not present

## 2018-07-21 MED ORDER — RADIUM RA 223 DICHLORIDE 30 MCCI/ML IV SOLN
145.8000 | Freq: Once | INTRAVENOUS | Status: AC
  Administered 2018-07-21: 145.8 via INTRAVENOUS

## 2018-07-21 NOTE — Progress Notes (Signed)
  Radiation Oncology         [phone] ________________________________  Name: Edward Bailey MRN: [id number]  Date: 07/21/2018  DOB: 1935-12-17  Radium-223 Infusion Note  Diagnosis:  Castration resistant prostate cancer with painful bone involvement  Current Infusion:    6  Planned Infusions:  6  Narrative: Edward Bailey presented to nuclear medicine for treatment. His most recent blood counts were reviewed.  He remains a good candidate to proceed with Ra-223.  The patient was situated in an infusion suite with a contact barrier placed under his arm. Intravenous access was established, using sterile technique, and a normal saline infusion from a syringe was started.  Micro-dosimetry:  The prescribed radiation activity was assayed and confirmed to be within specified tolerance.  Special Treatment Procedure - Infusion:  The nuclear medicine technologist and I personally verified the dose activity to be delivered as specified in the written directive, and verified the patient identification via 2 separate methods.  The syringe containing the dose was attached to an intravenous access and the dose delivered over a minute. No complications were noted.  The total administered dose was 149.0 microcuries in a volume of 5 cc.   A saline flush of the line and the syringe that contained the isotope was then performed.  The residual radioactivity in the syringe was 3.2 microcuries, so the actual infused isotope activity was 145.8 microcuries.   Pressure was applied to the venipuncture site, and a compression bandage placed.   Radiation Safety personnel were present to perform the discharge survey, as detailed on their documentation.   After a short period of observation, the patient had his IV removed.  Impression:  The patient tolerated his infusion relatively well.  Plan:  The patient has completed Xofigo treatments. He will follow up in 1 month.     ________________________________  Sheral Apley. Tammi Klippel, M.D.   This document serves as a record of services personally performed by Tyler Pita, MD. It was created on his behalf by Wilburn Mylar, a trained medical scribe. The creation of this record is based on the scribe's personal observations and the provider's statements to them. This document has been checked and approved by the attending provider.

## 2018-07-22 DIAGNOSIS — C61 Malignant neoplasm of prostate: Secondary | ICD-10-CM | POA: Diagnosis not present

## 2018-07-22 DIAGNOSIS — C7951 Secondary malignant neoplasm of bone: Secondary | ICD-10-CM | POA: Diagnosis not present

## 2018-07-31 ENCOUNTER — Other Ambulatory Visit: Payer: Self-pay | Admitting: Oncology

## 2018-07-31 DIAGNOSIS — C61 Malignant neoplasm of prostate: Secondary | ICD-10-CM

## 2018-08-05 ENCOUNTER — Encounter (HOSPITAL_COMMUNITY)
Admission: RE | Admit: 2018-08-05 | Discharge: 2018-08-05 | Disposition: A | Discharge status: Home / Self Care | Payer: Medicare Other | Source: Ambulatory Visit | Attending: Oncology | Admitting: Oncology

## 2018-08-05 ENCOUNTER — Inpatient Hospital Stay: Payer: Medicare Other | Attending: Oncology

## 2018-08-05 ENCOUNTER — Ambulatory Visit (HOSPITAL_COMMUNITY)
Admission: RE | Admit: 2018-08-05 | Discharge: 2018-08-05 | Disposition: A | Discharge status: Home / Self Care | Payer: Medicare Other | Source: Ambulatory Visit | Attending: Oncology | Admitting: Oncology

## 2018-08-05 DIAGNOSIS — Z923 Personal history of irradiation: Secondary | ICD-10-CM | POA: Diagnosis not present

## 2018-08-05 DIAGNOSIS — Z7982 Long term (current) use of aspirin: Secondary | ICD-10-CM | POA: Diagnosis not present

## 2018-08-05 DIAGNOSIS — C61 Malignant neoplasm of prostate: Secondary | ICD-10-CM

## 2018-08-05 DIAGNOSIS — Z79899 Other long term (current) drug therapy: Secondary | ICD-10-CM | POA: Insufficient documentation

## 2018-08-05 DIAGNOSIS — E0590 Thyrotoxicosis, unspecified without thyrotoxic crisis or storm: Secondary | ICD-10-CM | POA: Diagnosis not present

## 2018-08-05 DIAGNOSIS — Z9079 Acquired absence of other genital organ(s): Secondary | ICD-10-CM | POA: Insufficient documentation

## 2018-08-05 DIAGNOSIS — C7951 Secondary malignant neoplasm of bone: Secondary | ICD-10-CM | POA: Insufficient documentation

## 2018-08-05 LAB — CMP (CANCER CENTER ONLY)
ALBUMIN: 3.4 g/dL — AB (ref 3.5–5.0)
ALT: 7 U/L (ref 0–44)
AST: 20 U/L (ref 15–41)
Alkaline Phosphatase: 173 U/L — ABNORMAL HIGH (ref 38–126)
Anion gap: 9 (ref 5–15)
BUN: 15 mg/dL (ref 8–23)
CHLORIDE: 102 mmol/L (ref 98–111)
CO2: 25 mmol/L (ref 22–32)
Calcium: 8.6 mg/dL — ABNORMAL LOW (ref 8.9–10.3)
Creatinine: 0.88 mg/dL (ref 0.61–1.24)
GFR, Est AFR Am: >60 mL/min (ref >60)
GFR, Estimated: >60 mL/min (ref >60)
GLUCOSE: 108 mg/dL — AB (ref 70–99)
POTASSIUM: 4.3 mmol/L (ref 3.5–5.1)
Sodium: 136 mmol/L (ref 135–145)
Total Bilirubin: 0.5 mg/dL (ref 0.3–1.2)
Total Protein: 6.9 g/dL (ref 6.5–8.1)

## 2018-08-05 LAB — CBC WITH DIFFERENTIAL (CANCER CENTER ONLY)
Abs Immature Granulocytes: 0.06 10*3/uL (ref 0.00–0.07)
BASOS ABS: 0 10*3/uL (ref 0.0–0.1)
BASOS PCT: 0 %
EOS ABS: 0.2 10*3/uL (ref 0.0–0.5)
Eosinophils Relative: 4 %
HCT: 36.1 % — ABNORMAL LOW (ref 39.0–52.0)
Hemoglobin: 12.1 g/dL — ABNORMAL LOW (ref 13.0–17.0)
Immature Granulocytes: 1 %
Lymphocytes Relative: 9 %
Lymphs Abs: 0.6 10*3/uL — ABNORMAL LOW (ref 0.7–4.0)
MCH: 30.6 pg (ref 26.0–34.0)
MCHC: 33.5 g/dL (ref 30.0–36.0)
MCV: 91.4 fL (ref 80.0–100.0)
Monocytes Absolute: 0.7 10*3/uL (ref 0.1–1.0)
Monocytes Relative: 11 %
NEUTROS ABS: 4.5 10*3/uL (ref 1.7–7.7)
Neutrophils Relative %: 75 %
Platelet Count: 239 10*3/uL (ref 150–400)
RBC: 3.95 MIL/uL — AB (ref 4.22–5.81)
RDW: 13.4 % (ref 11.5–15.5)
WBC Count: 6 10*3/uL (ref 4.0–10.5)
nRBC: 0 % (ref 0.0–0.2)

## 2018-08-05 MED ORDER — IOHEXOL 300 MG/ML  SOLN
100.0000 mL | Freq: Once | INTRAMUSCULAR | Status: AC | PRN
  Administered 2018-08-05: 100 mL via INTRAVENOUS

## 2018-08-05 MED ORDER — TECHNETIUM TC 99M MEDRONATE IV KIT
20.0000 | PACK | Freq: Once | INTRAVENOUS | Status: AC | PRN
  Administered 2018-08-05: 22 via INTRAVENOUS

## 2018-08-05 MED ORDER — SODIUM CHLORIDE (PF) 0.9 % IJ SOLN
INTRAMUSCULAR | Status: AC
  Filled 2018-08-05: qty 50

## 2018-08-06 ENCOUNTER — Inpatient Hospital Stay (HOSPITAL_BASED_OUTPATIENT_CLINIC_OR_DEPARTMENT_OTHER): Payer: Medicare Other | Admitting: Oncology

## 2018-08-06 ENCOUNTER — Encounter: Payer: Self-pay | Admitting: Oncology

## 2018-08-06 ENCOUNTER — Telehealth: Payer: Self-pay | Admitting: Oncology

## 2018-08-06 VITALS — BP 141/69 | HR 79 | Temp 97.5°F | Resp 18 | Ht 74.0 in | Wt 215.8 lb

## 2018-08-06 DIAGNOSIS — Z9079 Acquired absence of other genital organ(s): Secondary | ICD-10-CM | POA: Diagnosis not present

## 2018-08-06 DIAGNOSIS — Z7982 Long term (current) use of aspirin: Secondary | ICD-10-CM | POA: Diagnosis not present

## 2018-08-06 DIAGNOSIS — Z923 Personal history of irradiation: Secondary | ICD-10-CM | POA: Diagnosis not present

## 2018-08-06 DIAGNOSIS — C61 Malignant neoplasm of prostate: Secondary | ICD-10-CM | POA: Diagnosis not present

## 2018-08-06 DIAGNOSIS — Z79899 Other long term (current) drug therapy: Secondary | ICD-10-CM | POA: Diagnosis not present

## 2018-08-06 DIAGNOSIS — C7951 Secondary malignant neoplasm of bone: Secondary | ICD-10-CM

## 2018-08-06 DIAGNOSIS — E0590 Thyrotoxicosis, unspecified without thyrotoxic crisis or storm: Secondary | ICD-10-CM | POA: Diagnosis not present

## 2018-08-06 DIAGNOSIS — Z7189 Other specified counseling: Secondary | ICD-10-CM

## 2018-08-06 LAB — PROSTATE-SPECIFIC AG, SERUM (LABCORP): PROSTATE SPECIFIC AG, SERUM: 623 ng/mL — AB (ref 0.0–4.0)

## 2018-08-06 MED ORDER — PROCHLORPERAZINE MALEATE 10 MG PO TABS: 10.0000 mg | ORAL_TABLET | Freq: Four times a day (QID) | ORAL | 0 refills | Status: AC | PRN

## 2018-08-06 MED ORDER — LIDOCAINE-PRILOCAINE 2.5-2.5 % EX CREA: 1.0000 "application " | TOPICAL_CREAM | CUTANEOUS | 0 refills | Status: AC | PRN

## 2018-08-06 NOTE — Telephone Encounter (Signed)
Scheduled appt per 1/15 los - gave patient AVS and calender per los.   

## 2018-08-06 NOTE — Progress Notes (Signed)
START ON PATHWAY REGIMEN - Prostate     A cycle is every 21 days:     Docetaxel      Prednisone   **Always confirm dose/schedule in your pharmacy ordering system**  Patient Characteristics: Adenocarcinoma, Distant Metastases, Castration Resistant, Symptomatic, Docetaxel Eligible Histology: Adenocarcinoma Therapeutic Status: Distant Metastases  Intent of Therapy: Non-Curative / Palliative Intent, Discussed with Patient 

## 2018-08-06 NOTE — Progress Notes (Signed)
Hematology and Oncology Follow Up Visit  Edward Bailey [id number] [birth year] 83 y.o. 08/06/2018 3:24 PM Edward Bailey, MDGriffin, [name], MD   Principle Diagnosis: 83 year old man with advanced prostate cancer with disease to the bone diagnosed in 2017.   He was initially diagnosed in 1999 with Gleason score 3+4 = 7 and a PSA of 4.  He has castration-resistant disease.   Prior Therapy:  He is status post prostatectomy and found to have stage T3b disease. He received salvage radiation therapy for a rise in his PSA.  He developed biochemical relapse and was treated with androgen depravation intermittently between 2010 to about 2015.  He developed castration resistant disease in 2015.  Ketoconazole at 200 mg twice a day and prednisone 5 mg daily. Therapy discontinued on 05/24/2016 because of progression of disease. Zytiga 1000 mg daily with prednisone at 5 mg daily started in November 2017. Xtandi 160 mg daily started in April 2019 therapy discontinued due to progression of disease and July 2019. [name] started in July 2019.  He completed 6 months of therapy in December 2019.  Current therapy:   Xtandi 160 mg resumed in August 2019 after brief discontinuation in July 2019.  Interim History:  Edward Bailey is here for a follow-up visit.  Since the last visit, he reports no major changes in his health.  He has tolerated Xofigo infusions that ended in December 2019.  He denies any worsening bone pain or pathological fractures.  He does report some mild fatigue and tiredness but no other complaints.  His appetite remained reasonable and his performance status has not declined.  He continues to take [place] without any new issues.  He denies any recent hospitalization or illnesses.  He continues to live independently and attends activities of daily living.   He does not report any headaches, blurred vision or syncope.  He denies any lethargy or dizziness.  He does not report any fevers, chills or  sweats. He does not report any chest pain, shortness of breath, leg edema.  He denies any orthopnea or dyspnea on exertion.  He does not report any wheezing, cough or hemoptysis.  He does not report any changes in bowel habits.  He denies any hematochezia or melena.  He does not report any frequency urgency or hesitancy.  He does not report any arthralgias or myalgias.  He is not report any ecchymosis or petechiae.  He does not report any anxiety or depression.  Denies any skin dryness or irritation.  The rest of his review of systems is negative.      Medications: I have reviewed the patient's current medications.  Current Outpatient Medications  Medication Sig Dispense Refill  . aspirin (ASPIRIN EC) 81 MG EC tablet Take 81 mg by mouth daily. Swallow whole.    Marland Kitchen azelastine (ASTELIN) 0.1 % nasal spray Place 1-2 sprays into both nostrils daily as needed. allergies  5  . cetirizine (ZYRTEC) 10 MG tablet Take 10 mg by mouth daily as needed for allergies. As needed    . fluticasone (FLONASE) 50 MCG/ACT nasal spray Place 1 spray into both nostrils daily as needed. Allergies  5  . levothyroxine (SYNTHROID, LEVOTHROID) 112 MCG tablet TK 1 T PO QD IN THE MORNING OES  12  . levothyroxine (SYNTHROID, LEVOTHROID) 125 MCG tablet Alternates with 112 mcg  9  . Multiple Vitamins-Minerals (MULTIVITAMIN PO) Take 1 capsule by mouth daily.    [name] 40 MG capsule TAKE 4 CAPSULES (160 MG TOTAL) BY  MOUTH DAILY. 120 capsule 0   No current facility-administered medications for this visit.      Allergies:  Allergies  Allergen Reactions  . Amoxicillin Rash    Has patient had a PCN reaction causing immediate rash, facial/tongue/throat swelling, SOB or lightheadedness with hypotension: No Has patient had a PCN reaction causing severe rash involving mucus membranes or skin necrosis: No Has patient had a PCN reaction that required hospitalization No Has patient had a PCN reaction occurring within the last 10 years:  Yes If all of the above answers are "NO", then may proceed with Cephalosporin use.     Past Medical History, Surgical history, Social history, and Family History were reviewed and updated.   Physical Exam:  Blood pressure (!) 141/69, pulse 79, temperature (!) 97.5 F (36.4 C), temperature source Oral, resp. rate 18, height 6\' 2"  (1.88 m), weight 215 lb 12.8 oz (97.9 kg), SpO2 100 %.    ECOG: 1   General appearance: Comfortable appearing without any discomfort Head: Normocephalic without any trauma Oropharynx: Mucous membranes are moist and pink without any thrush or ulcers. Eyes: Pupils are equal and round reactive to light. Lymph nodes: No cervical, supraclavicular, inguinal or axillary lymphadenopathy.   Heart:regular rate and rhythm.  S1 and S2 without leg edema. Lung: Clear without any rhonchi or wheezes.  No dullness to percussion. Abdomin: Soft, nontender, nondistended with good bowel sounds.  No hepatosplenomegaly. Musculoskeletal: No joint deformity or effusion.  Full range of motion noted. Neurological: No deficits noted on motor, sensory and deep tendon reflex exam. Skin: No petechial rash or dryness.  Appeared moist.      Lab Results: Lab Results  Component Value Date   WBC 6.0 08/05/2018   HGB 12.1 (L) 08/05/2018   HCT 36.1 (L) 08/05/2018   MCV 91.4 08/05/2018   PLT 239 08/05/2018     Chemistry      Component Value Date/Time   NA 136 08/05/2018 1002   NA 139 06/07/2017 1057   K 4.3 08/05/2018 1002   K 3.6 06/07/2017 1057   CL 102 08/05/2018 1002   CO2 25 08/05/2018 1002   CO2 26 06/07/2017 1057   BUN 15 08/05/2018 1002   BUN 18.5 06/07/2017 1057   CREATININE 0.88 08/05/2018 1002   CREATININE 1.2 06/07/2017 1057      Component Value Date/Time   CALCIUM 8.6 (L) 08/05/2018 1002   CALCIUM 9.0 06/07/2017 1057   ALKPHOS 173 (H) 08/05/2018 1002   ALKPHOS 112 06/07/2017 1057   AST 20 08/05/2018 1002   AST 14 06/07/2017 1057   ALT 7 08/05/2018 1002    ALT 12 06/07/2017 1057   BILITOT 0.5 08/05/2018 1002   BILITOT 0.66 06/07/2017 1057     Results for Edward Bailey (MRN [id number]) as of 08/06/2018 15:17  Ref. Range 05/28/2018 10:10 08/05/2018 10:02  Prostate Specific Ag, Serum Latest Ref Range: 0.0 - 4.0 ng/mL 249.0 (H) 623.0 (H)    EXAM: CT ABDOMEN AND PELVIS WITH CONTRAST  TECHNIQUE: Multidetector CT imaging of the abdomen and pelvis was performed using the standard protocol following bolus administration of intravenous contrast.  CONTRAST:  12mL OMNIPAQUE IOHEXOL 300 MG/ML  SOLN  COMPARISON:  03/18/2018  FINDINGS: Lower chest: Coronary artery calcification is evident.  Hepatobiliary: No focal abnormality within the liver parenchyma. There is no evidence for gallstones, gallbladder wall thickening, or pericholecystic fluid. No intrahepatic or extrahepatic biliary dilation.  Pancreas: No focal mass lesion. No dilatation of the main duct. No  intraparenchymal cyst. No peripancreatic edema.  Spleen: No splenomegaly. No focal mass lesion.  Adrenals/Urinary Tract: No adrenal nodule or mass. Parenchymal scarring noted in the kidneys bilaterally without hydronephrosis. No evidence for hydroureter. The urinary bladder appears normal for the degree of distention.  Stomach/Bowel: Stomach is nondistended. No gastric wall thickening. No evidence of outlet obstruction. Duodenum is normally positioned as is the ligament of Treitz. No small bowel wall thickening. No small bowel dilatation. The terminal ileum is normal. The appendix is normal. No gross colonic mass. No colonic wall thickening.  Vascular/Lymphatic: There is abdominal aortic atherosclerosis without aneurysm. There is no gastrohepatic or hepatoduodenal ligament lymphadenopathy. No intraperitoneal or retroperitoneal lymphadenopathy. No pelvic sidewall lymphadenopathy. Surgical clips along both pelvic sidewalls suggest prior lymph node  dissection.  Reproductive: Prostate gland surgically absent.  Other: No intraperitoneal free fluid.  Musculoskeletal: The innumerable diffuse sclerotic lesions throughout the bony anatomy are similar to prior. Index lesion measured previously in the left inferior pubic ramus at 11 x 7 mm is stable, measuring 11 x 6 mm today.  IMPRESSION: 1. Stable exam. No new or progressive findings. 2. Innumerable sclerotic lesions throughout the bony anatomy consistent with metastatic disease. Size and number of the sclerotic lesions appear stable in the interval. 3.  Aortic Atherosclerois (ICD10-170.0  EXAM: NUCLEAR MEDICINE WHOLE BODY BONE SCAN  TECHNIQUE: Whole body anterior and posterior images were obtained approximately 3 hours after intravenous injection of radiopharmaceutical.  RADIOPHARMACEUTICALS:  22 mCi Technetium-25m MDP IV  COMPARISON:  09/10/2017  Correlation: CT abdomen and pelvis 08/05/2018  FINDINGS: Multiple sites of abnormal increased tracer accumulation are identified consistent with osseous metastatic disease, progressive since the prior study.  Foci of uptake are seen throughout BILATERAL anterior and posterior ribs, BILATERAL clavicles, calvarium, sternum, lumbar spine, BILATERAL humeri, and probably BILATERAL femoral diaphyses.  Progressive increased uptake in posterior LEFT pelvis.  New uptake also identified at the proximal RIGHT tibia.  Question new focus of uptake at the RIGHT forearm versus injection site.  IMPRESSION: Numerous new sites of abnormal osseous tracer accumulation consistent with widespread osseous metastatic disease progressive since 09/10/2017.  New sites include BILATERAL humeri, BILATERAL femoral, RIGHT tibia, and questionably RIGHT forearm.   Impression and Plan:  83 year old man with  1. castration-resistant prostate cancer with disease to the bone diagnosed in 2017.  He is status post multiple therapies  outlined above.    His PSA was up to 623 on August 05, 2018.  CT scan and bone scan for staging purposes were completed and discussed today extensively.  These imaging studies were personally reviewed and discussed with the patient and imaging studies were also shared with the patient today.  His scans confirmed progression of disease predominantly in the bone.  His alkaline phosphatase after brief decline showed increase again which indicates progression of disease.  His bone scan was also compared to previous bone scans that confirm these findings.  The natural course of this disease was reviewed again and treatment options were discussed.  I feel that systemic chemotherapy is his best option moving forward.  He has progressed on oral targeted therapy as well as Xofigo.  Risks and benefits of Taxotere chemotherapy given at 75 mg/m every 3 weeks for a total of 10 cycles were reviewed.  Complications include nausea, vomiting, myelosuppression, neutropenia, neutropenic sepsis, peripheral neuropathy and infusion related complications were discussed.  The benefit would be improvement in his overall survival, cancer symptoms and delay of disease progression.  Alternative therapies were discussed but  I am afraid limited at this time.  Different manual agents have been available for prostate cancer but not approved for this indication.  After discussion today, he is agreeable to proceed after chemo education class.  I have recommended discontinuation of Xtandi at this time.       2. Androgen depravation: He is currently on Lupron which I recommended continuing indefinitely.  Currently receiving under the care of Dr. Diona Fanti.  3.  Bone directed therapy: His bone scan showed extensive bony metastasis and I recommended continuing Xgeva which he currently receives under the care of Dr. Diona Fanti.   4. Hyperthyroidism: Thyroid function remains under control at this time.  5.  IV access: Risks and  benefits of a Port-A-Cath insertion was discussed today.  Complications include bleeding, thrombosis and infection were reviewed.  Is agreeable to proceed and I have asked interventional radiology to currently place that in the next 2 weeks.  6.  Antiemetics: Prescription for Compazine was made available to him.  7.  Neutropenia prophylaxis: I recommended using growth factor support after each cycle of therapy.  8.  Prognosis and goals of care: Therapy is palliative at this time and his disease is progressing rapidly.  Although he is limited symptoms I fear that without aggressive treatment his disease will become symptomatic and likely limited his life expectancy to single digit months.  Currently with aggressive chemotherapy we are likely looking at double-digit months of life expectancy.  He is aware of these findings and I encouraged him to continue to address advanced directives.  9. Followup: We will be in the immediate future to start chemotherapy.   40 minutes was spent with the patient face-to-face today.  More than 50% of time was dedicated to reviewing imaging studies, laboratory data, treatment options, complications related to therapy, prognosis and answering questions regarding overall future plan of care.  Zola Button, MD 1/15/20203:24 PM

## 2018-08-07 ENCOUNTER — Telehealth: Payer: Self-pay | Admitting: *Deleted

## 2018-08-07 ENCOUNTER — Inpatient Hospital Stay: Payer: Medicare Other

## 2018-08-07 ENCOUNTER — Encounter: Payer: Self-pay | Admitting: Oncology

## 2018-08-07 NOTE — Progress Notes (Signed)
Met with patient and spouse to introduce myself as Financial Resource Specialist and to offer available resources. ° °Advised of available copay assistance through PAF. Patent states he doesn't think they will qualify based on federal poverty guidelines. ° °Discussed one-time in-house grants([facility] and Prostate). Patient states they are over the income for the guidelines quoted. ° °Patient had questions regarding insurance OOP. Explained to him ow that works for the year and he would be billed his percentage after insurance pays. He verbalized understanding. ° °Gave my card for any additional financial questions or concerns. °  °

## 2018-08-07 NOTE — Telephone Encounter (Signed)
L/m for patient to call me  

## 2018-08-08 ENCOUNTER — Telehealth: Payer: Self-pay | Admitting: *Deleted

## 2018-08-08 NOTE — Telephone Encounter (Signed)
Attempted to call patient, to discuss "my chart" question. no answer.

## 2018-08-10 NOTE — Addendum Note (Signed)
Encounter addended by: Tyler Pita, MD on: 08/10/2018 1:57 PM  Actions taken: Medication List reviewed, Problem List reviewed, Allergies reviewed, Clinical Note Signed

## 2018-08-10 NOTE — Progress Notes (Signed)
  Radiation Oncology         [phone] ________________________________  Name: OSHEA PERCIVAL MRN: [id number]  Date: 06/16/2018  DOB: [birth year]  Radium-223 Infusion Note  Diagnosis:  Castration resistant prostate cancer with painful bone involvement  Current Infusion:    5  Planned Infusions:  6  Narrative: Mr. EASTER SCHINKE presented to nuclear medicine for treatment. His most recent blood counts were reviewed.  He remains a good candidate to proceed with Ra-223.  The patient was situated in an infusion suite with a contact barrier placed under his arm. Intravenous access was established, using sterile technique, and a normal saline infusion from a syringe was started.  Micro-dosimetry:  The prescribed radiation activity was assayed and confirmed to be within specified tolerance.  Special Treatment Procedure - Infusion:  The nuclear medicine technologist and I personally verified the dose activity to be delivered as specified in the written directive, and verified the patient identification via 2 separate methods.  The syringe containing the dose was attached to an intravenous access and the dose delivered over a minute. No complications were noted.  The total administered dose was 149.5 microcuries in a volume of 5 cc.   A saline flush of the line and the syringe that contained the isotope was then performed.  The residual radioactivity in the syringe was 2.91 microcuries, so the actual infused isotope activity was 146.6 microcuries.   Pressure was applied to the venipuncture site, and a compression bandage placed.   Radiation Safety personnel were present to perform the discharge survey, as detailed on their documentation.   After a short period of observation, the patient had his IV removed.  Impression:  The patient tolerated his infusion relatively well.  Plan:  The patient will return in one month for ongoing care.    ________________________________  Sheral Apley. Tammi Klippel,  M.D.

## 2018-08-12 ENCOUNTER — Encounter: Payer: Self-pay | Admitting: Oncology

## 2018-08-13 ENCOUNTER — Encounter: Payer: Self-pay | Admitting: Podiatry

## 2018-08-13 ENCOUNTER — Ambulatory Visit: Payer: Medicare Other | Admitting: Podiatry

## 2018-08-13 DIAGNOSIS — B351 Tinea unguium: Secondary | ICD-10-CM | POA: Diagnosis not present

## 2018-08-13 DIAGNOSIS — M79674 Pain in right toe(s): Secondary | ICD-10-CM | POA: Diagnosis not present

## 2018-08-13 DIAGNOSIS — M79675 Pain in left toe(s): Secondary | ICD-10-CM | POA: Diagnosis not present

## 2018-08-13 NOTE — Progress Notes (Addendum)
Complaint:  Visit Type: Patient presents  to my office for  preventative foot care services. Complaint: Patient states" my nails have grown long and thick and become painful to walk and wear shoes" Patient is scheduled to undergo chemotherapy next week.. The patient presents for preventative foot care services. Patient presents to the office with his wife.  Podiatric Exam: Vascular: dorsalis pedis and posterior tibial pulses are palpable bilateral. Capillary return is immediate. Temperature gradient is WNL. Skin turgor WNL  Sensorium: Normal Semmes Weinstein monofilament test. Normal tactile sensation bilaterally. Nail Exam: Pt has thick disfigured discolored nails with subungual debris noted bilateral entire nail hallux through fifth toenails Ulcer Exam: There is no evidence of ulcer or pre-ulcerative changes or infection. Orthopedic Exam: Muscle tone and strength are WNL. No limitations in general ROM. No crepitus or effusions noted. Foot type and digits show no abnormalities. Bony prominences are unremarkable. Skin: No Porokeratosis. No infection or ulcers  Diagnosis:  Onychomycosis, , Pain in right toe, pain in left toes  Treatment & Plan Procedures and Treatment: Consent by patient was obtained for treatment procedures.   Debridement of mycotic and hypertrophic toenails, 1 through 5 bilateral and clearing of subungual debris. No ulceration, no infection noted.  Return Visit-Office Procedure: Patient instructed to return to the office for a follow up visit 3 months for continued evaluation and treatment.    Gardiner Barefoot DPM

## 2018-08-14 ENCOUNTER — Encounter: Payer: Self-pay | Admitting: Oncology

## 2018-08-14 ENCOUNTER — Telehealth: Payer: Self-pay

## 2018-08-14 NOTE — Telephone Encounter (Signed)
Contacted patient and made him aware that the EMLA cream has to go through the authorization process for insurance coverage. Also explained that the first time he will be able to use the EMLA cream will be for the 2/21 appointment and the cream should be authorized by that time, and to check with the pharmacy the first week of February. Answered other questions and concerns and patient is aware to call or send a message if any other questions arise.

## 2018-08-15 MED FILL — XTANDI 40 MG CAPSULE: 40 | 30 days supply | Qty: 120 | Fill #0

## 2018-08-18 ENCOUNTER — Other Ambulatory Visit: Payer: Self-pay | Admitting: Radiology

## 2018-08-18 ENCOUNTER — Telehealth: Payer: Self-pay

## 2018-08-18 NOTE — Telephone Encounter (Signed)
Received call from patient stating that he called the pharmacy and the EMLA cream has not been authorized by insurance. Explained that we have a department that works on all authorizations and that he should have an answer prior to the 2/21 appointment in which he would be able to use the cream. The patient thought he needed the cream for the port placement tomorrow and then his infusion appt on 1/30. Explained that the does not need the cream for the port placement and that he will not be able to use the cream on 1/30 because the port has to heal first. Patient spouse was also part of conversation and verbalized understanding.

## 2018-08-19 ENCOUNTER — Ambulatory Visit (HOSPITAL_COMMUNITY)
Admission: RE | Admit: 2018-08-19 | Discharge: 2018-08-19 | Disposition: A | Discharge status: Home / Self Care | Payer: Medicare Other | Source: Ambulatory Visit | Attending: Oncology | Admitting: Oncology

## 2018-08-19 ENCOUNTER — Encounter (HOSPITAL_COMMUNITY): Payer: Self-pay

## 2018-08-19 ENCOUNTER — Other Ambulatory Visit: Payer: Self-pay | Admitting: Oncology

## 2018-08-19 ENCOUNTER — Other Ambulatory Visit: Payer: Self-pay

## 2018-08-19 DIAGNOSIS — Z452 Encounter for adjustment and management of vascular access device: Secondary | ICD-10-CM | POA: Diagnosis not present

## 2018-08-19 DIAGNOSIS — Z79890 Hormone replacement therapy (postmenopausal): Secondary | ICD-10-CM | POA: Diagnosis not present

## 2018-08-19 DIAGNOSIS — C61 Malignant neoplasm of prostate: Secondary | ICD-10-CM | POA: Diagnosis not present

## 2018-08-19 DIAGNOSIS — Z5111 Encounter for antineoplastic chemotherapy: Secondary | ICD-10-CM | POA: Diagnosis not present

## 2018-08-19 DIAGNOSIS — E785 Hyperlipidemia, unspecified: Secondary | ICD-10-CM | POA: Diagnosis not present

## 2018-08-19 DIAGNOSIS — Z79899 Other long term (current) drug therapy: Secondary | ICD-10-CM | POA: Insufficient documentation

## 2018-08-19 DIAGNOSIS — Z7982 Long term (current) use of aspirin: Secondary | ICD-10-CM | POA: Insufficient documentation

## 2018-08-19 DIAGNOSIS — K219 Gastro-esophageal reflux disease without esophagitis: Secondary | ICD-10-CM | POA: Insufficient documentation

## 2018-08-19 HISTORY — PX: IR IMAGING GUIDED PORT INSERTION: IMG5740

## 2018-08-19 LAB — CBC WITH DIFFERENTIAL/PLATELET
Abs Immature Granulocytes: 0.13 10*3/uL — ABNORMAL HIGH (ref 0.00–0.07)
Basophils Absolute: 0 10*3/uL (ref 0.0–0.1)
Basophils Relative: 0 %
EOS PCT: 2 %
Eosinophils Absolute: 0.2 10*3/uL (ref 0.0–0.5)
HCT: 37.4 % — ABNORMAL LOW (ref 39.0–52.0)
Hemoglobin: 11.8 g/dL — ABNORMAL LOW (ref 13.0–17.0)
Immature Granulocytes: 2 %
Lymphocytes Relative: 9 %
Lymphs Abs: 0.7 10*3/uL (ref 0.7–4.0)
MCH: 29.9 pg (ref 26.0–34.0)
MCHC: 31.6 g/dL (ref 30.0–36.0)
MCV: 94.9 fL (ref 80.0–100.0)
Monocytes Absolute: 0.9 10*3/uL (ref 0.1–1.0)
Monocytes Relative: 10 %
Neutro Abs: 6.7 10*3/uL (ref 1.7–7.7)
Neutrophils Relative %: 77 %
Platelets: 341 10*3/uL (ref 150–400)
RBC: 3.94 MIL/uL — ABNORMAL LOW (ref 4.22–5.81)
RDW: 13.8 % (ref 11.5–15.5)
WBC: 8.7 10*3/uL (ref 4.0–10.5)
nRBC: 0 % (ref 0.0–0.2)

## 2018-08-19 LAB — PROTIME-INR
INR: 0.96
Prothrombin Time: 12.7 seconds (ref 11.4–15.2)

## 2018-08-19 MED ORDER — SODIUM CHLORIDE 0.9 % IV SOLN: INTRAVENOUS | Status: DC

## 2018-08-19 MED ORDER — MIDAZOLAM HCL 2 MG/2ML IJ SOLN
INTRAMUSCULAR | Status: AC
  Filled 2018-08-19: qty 2

## 2018-08-19 MED ORDER — FENTANYL CITRATE (PF) 100 MCG/2ML IJ SOLN
INTRAMUSCULAR | Status: AC | PRN
  Administered 2018-08-19: 25 ug via INTRAVENOUS
  Administered 2018-08-19: 50 ug via INTRAVENOUS

## 2018-08-19 MED ORDER — LIDOCAINE-EPINEPHRINE (PF) 2 %-1:200000 IJ SOLN
INTRAMUSCULAR | Status: AC
  Filled 2018-08-19: qty 20

## 2018-08-19 MED ORDER — CLINDAMYCIN PHOSPHATE 900 MG/50ML IV SOLN
INTRAVENOUS | Status: AC
  Filled 2018-08-19: qty 50

## 2018-08-19 MED ORDER — LIDOCAINE-EPINEPHRINE (PF) 1 %-1:200000 IJ SOLN
INTRAMUSCULAR | Status: AC | PRN
  Administered 2018-08-19: 15 mL via INTRADERMAL

## 2018-08-19 MED ORDER — VANCOMYCIN HCL IN DEXTROSE 1-5 GM/200ML-% IV SOLN
INTRAVENOUS | Status: AC
  Filled 2018-08-19: qty 200

## 2018-08-19 MED ORDER — HEPARIN SOD (PORK) LOCK FLUSH 100 UNIT/ML IV SOLN
INTRAVENOUS | Status: AC
  Filled 2018-08-19: qty 5

## 2018-08-19 MED ORDER — MIDAZOLAM HCL 2 MG/2ML IJ SOLN
INTRAMUSCULAR | Status: AC | PRN
  Administered 2018-08-19: 0.5 mg via INTRAVENOUS
  Administered 2018-08-19: 1 mg via INTRAVENOUS

## 2018-08-19 MED ORDER — FENTANYL CITRATE (PF) 100 MCG/2ML IJ SOLN
INTRAMUSCULAR | Status: AC
  Filled 2018-08-19: qty 2

## 2018-08-19 MED ORDER — CLINDAMYCIN PHOSPHATE 900 MG/50ML IV SOLN
900.0000 mg | Freq: Once | INTRAVENOUS | Status: AC
  Administered 2018-08-19: 900 mg via INTRAVENOUS
  Filled 2018-08-19: qty 50

## 2018-08-19 NOTE — Procedures (Signed)
Prostate ca  S/p RT IJ POWER PORT Tip svcra No comp Stable ebl min Ready for use Full report in pacs

## 2018-08-19 NOTE — Discharge Instructions (Signed)
Implanted Port Insertion, Care After °This sheet gives you information about how to care for yourself after your procedure. Your health care provider may also give you more specific instructions. If you have problems or questions, contact your health care provider. °What can I expect after the procedure? °After the procedure, it is common to have: °· Discomfort at the port insertion site. °· Bruising on the skin over the port. This should improve over 3-4 days. °Follow these instructions at home: °Port care °· After your port is placed, you will get a manufacturer's information card. The card has information about your port. Keep this card with you at all times. °· Take care of the port as told by your health care provider. Ask your health care provider if you or a family member can get training for taking care of the port at home. A home health care nurse may also take care of the port. °· Make sure to remember what type of port you have. °Incision care ° °  ° °· Follow instructions from your health care provider about how to take care of your port insertion site. Make sure you: °? Wash your hands with soap and water before and after you change your bandage (dressing). If soap and water are not available, use hand sanitizer. °? Change your dressing as told by your health care provider. °? Leave stitches (sutures), skin glue, or adhesive strips in place. These skin closures may need to stay in place for 2 weeks or longer. If adhesive strip edges start to loosen and curl up, you may trim the loose edges. Do not remove adhesive strips completely unless your health care provider tells you to do that. °· Check your port insertion site every day for signs of infection. Check for: °? Redness, swelling, or pain. °? Fluid or blood. °? Warmth. °? Pus or a bad smell. °Activity °· Return to your normal activities as told by your health care provider. Ask your health care provider what activities are safe for you. °· Do not  lift anything that is heavier than 10 lb (4.5 kg), or the limit that you are told, until your health care provider says that it is safe. °General instructions °· Take over-the-counter and prescription medicines only as told by your health care provider. °· Do not take baths, swim, or use a hot tub until your health care provider approves. Ask your health care provider if you may take showers. You may only be allowed to take sponge baths. °· Do not drive for 24 hours if you were given a sedative during your procedure. °· Wear a medical alert bracelet in case of an emergency. This will tell any health care providers that you have a port. °· Keep all follow-up visits as told by your health care provider. This is important. °Contact a health care provider if: °· You cannot flush your port with saline as directed, or you cannot draw blood from the port. °· You have a fever or chills. °· You have redness, swelling, or pain around your port insertion site. °· You have fluid or blood coming from your port insertion site. °· Your port insertion site feels warm to the touch. °· You have pus or a bad smell coming from the port insertion site. °Get help right away if: °· You have chest pain or shortness of breath. °· You have bleeding from your port that you cannot control. °Summary °· Take care of the port as told by your health   care provider. Keep the manufacturer's information card with you at all times. °· Change your dressing as told by your health care provider. °· Contact a health care provider if you have a fever or chills or if you have redness, swelling, or pain around your port insertion site. °· Keep all follow-up visits as told by your health care provider. °This information is not intended to replace advice given to you by your health care provider. Make sure you discuss any questions you have with your health care provider. °Document Released: 04/29/2013 Document Revised: 02/04/2018 Document Reviewed:  02/04/2018 °Elsevier Interactive Patient Education © 2019 [employer] ° °Moderate Conscious Sedation, Adult, Care After °These instructions provide you with information about caring for yourself after your procedure. Your health care provider may also give you more specific instructions. Your treatment has been planned according to current medical practices, but problems sometimes occur. Call your health care provider if you have any problems or questions after your procedure. °What can I expect after the procedure? °After your procedure, it is common: °· To feel sleepy for several hours. °· To feel clumsy and have poor balance for several hours. °· To have poor judgment for several hours. °· To vomit if you eat too soon. °Follow these instructions at home: °For at least 24 hours after the procedure: ° °· Do not: °? Participate in activities where you could fall or become injured. °? Drive. °? Use heavy machinery. °? Drink alcohol. °? Take sleeping pills or medicines that cause drowsiness. °? Make important decisions or sign legal documents. °? Take care of children on your own. °· Rest. °Eating and drinking °· Follow the diet recommended by your health care provider. °· If you vomit: °? Drink water, juice, or soup when you can drink without vomiting. °? Make sure you have little or no nausea before eating solid foods. °General instructions °· Have a responsible adult stay with you until you are awake and alert. °· Take over-the-counter and prescription medicines only as told by your health care provider. °· If you smoke, do not smoke without supervision. °· Keep all follow-up visits as told by your health care provider. This is important. °Contact a health care provider if: °· You keep feeling nauseous or you keep vomiting. °· You feel light-headed. °· You develop a rash. °· You have a fever. °Get help right away if: °· You have trouble breathing. °This information is not intended to replace advice given to you  by your health care provider. Make sure you discuss any questions you have with your health care provider. °Document Released: 04/29/2013 Document Revised: 12/12/2015 Document Reviewed: 10/29/2015 °Elsevier Interactive Patient Education © 2019 Elsevier Inc. ° °

## 2018-08-19 NOTE — Consult Note (Addendum)
Chief Complaint: Patient was seen in consultation today for Port-A-Cath placement  Referring Physician(s): Wyatt Portela  Supervising Physician: Daryll Brod  Patient Status: [facility] - Out-pt  History of Present Illness: Edward Bailey is an 83 y.o. male with history of prostate cancer, initially diagnosed in 1999; now with disease progression and presents today for Port-A-Cath placement for palliative chemotherapy.  Past Medical History:  Diagnosis Date  . GERD (gastroesophageal reflux disease)   . Hyperthyroidism   . Prostate cancer [facility])    prostate cancer radiation and surgery 15 years ago, Dr. Maceo Pro    Past Surgical History:  Procedure Laterality Date  . COLONOSCOPY WITH PROPOFOL N/A 06/13/2015   Procedure: COLONOSCOPY WITH PROPOFOL;  Surgeon: Garlan Fair, MD;  Location: [facility] ENDOSCOPY;  Service: Endoscopy;  Laterality: N/A;  . PROSTATE SURGERY    . TONSILLECTOMY      Allergies: Amoxicillin  Medications: Prior to Admission medications   Medication Sig Start Date End Date Taking? Authorizing Provider  amLODipine (NORVASC) 5 MG tablet TK 1 T PO QD 05/09/18   [provider]  aspirin (ASPIRIN EC) 81 MG EC tablet Take 81 mg by mouth daily. Swallow whole.    [provider]  azelastine (ASTELIN) 0.1 % nasal spray Place 1-2 sprays into both nostrils daily as needed. allergies 04/21/15   [provider]  cetirizine (ZYRTEC) 10 MG tablet Take 10 mg by mouth daily as needed for allergies. As needed    [provider]  fluticasone (FLONASE) 50 MCG/ACT nasal spray Place 1 spray into both nostrils daily as needed. Allergies 04/21/15   [provider]  levothyroxine (SYNTHROID, LEVOTHROID) 112 MCG tablet TK 1 T PO QD IN THE MORNING OES 04/02/16   [provider]  levothyroxine (SYNTHROID, LEVOTHROID) 125 MCG tablet Alternates with 112 mcg 06/12/16   [provider]  lidocaine-prilocaine (EMLA) cream Apply 1  application topically as needed. 08/06/18   Wyatt Portela, MD  Multiple Vitamins-Minerals (MULTIVITAMIN PO) Take 1 capsule by mouth daily.    [provider]  prochlorperazine (COMPAZINE) 10 MG tablet Take 1 tablet (10 mg total) by mouth every 6 (six) hours as needed for nausea or vomiting. 08/06/18   Wyatt Portela, MD  XTANDI 40 MG capsule TAKE 4 CAPSULES (160 MG TOTAL) BY MOUTH DAILY. 07/31/18   Wyatt Portela, MD     Family History  Problem Relation Age of Onset  . Prostate cancer Brother   . Prostate cancer Brother     Social History   Socioeconomic History  . Marital status: Married    Spouse name: Not on file  . Number of children: Not on file  . Years of education: Not on file  . Highest education level: Not on file  Occupational History  . Occupation: retired    Comment: Designer, fashion/clothing  Social Needs  . Financial resource strain: Not on file  . Food insecurity:    Worry: Not on file    Inability: Not on file  . Transportation needs:    Medical: Not on file    Non-medical: Not on file  Tobacco Use  . Smoking status: Never Smoker  . Smokeless tobacco: Never Used  Substance and Sexual Activity  . Alcohol use: Yes    Comment: occasional  . Drug use: No  . Sexual activity: Not Currently  Lifestyle  . Physical activity:    Days per week: Not on file    Minutes per session: Not  on file  . Stress: Not on file  Relationships  . Social connections:    Talks on phone: Not on file    Gets together: Not on file    Attends religious service: Not on file    Active member of club or organization: Not on file    Attends meetings of clubs or organizations: Not on file    Relationship status: Not on file  Other Topics Concern  . Not on file  Social History Narrative   Resides in [place].       Review of Systems denies fever, headache, chest pain, dyspnea, cough, abdominal/back pain, nausea, vomiting or bleeding; he is HOH  Vital Signs: BP  125/76   Pulse 79   Temp (!) 97.5 F (36.4 C) (Oral)   Resp 18   SpO2 100%   Physical Exam awake, alert.  Chest clear to auscultation bilaterally.  Heart with regular rate and rhythm.  Abdomen soft, positive bowel sounds, nontender.  No lower extremity edema  Imaging: Nm Bone Scan Whole Body  Result Date: 08/05/2018 CLINICAL DATA:  Metastatic prostate cancer EXAM: NUCLEAR MEDICINE WHOLE BODY BONE SCAN TECHNIQUE: Whole body anterior and posterior images were obtained approximately 3 hours after intravenous injection of radiopharmaceutical. RADIOPHARMACEUTICALS:  22 mCi Technetium-22m MDP IV COMPARISON:  09/10/2017 Correlation: CT abdomen and pelvis 08/05/2018 FINDINGS: Multiple sites of abnormal increased tracer accumulation are identified consistent with osseous metastatic disease, progressive since the prior study. Foci of uptake are seen throughout BILATERAL anterior and posterior ribs, BILATERAL clavicles, calvarium, sternum, lumbar spine, BILATERAL humeri, and probably BILATERAL femoral diaphyses. Progressive increased uptake in posterior LEFT pelvis. New uptake also identified at the proximal RIGHT tibia. Question new focus of uptake at the RIGHT forearm versus injection site. IMPRESSION: Numerous new sites of abnormal osseous tracer accumulation consistent with widespread osseous metastatic disease progressive since 09/10/2017. New sites include BILATERAL humeri, BILATERAL femoral, RIGHT tibia, and questionably RIGHT forearm. Electronically Signed   By: Lavonia Dana M.D.   On: 08/05/2018 18:08   Ct Abdomen Pelvis W Contrast  Result Date: 08/05/2018 CLINICAL DATA:  Prostate cancer. EXAM: CT ABDOMEN AND PELVIS WITH CONTRAST TECHNIQUE: Multidetector CT imaging of the abdomen and pelvis was performed using the standard protocol following bolus administration of intravenous contrast. CONTRAST:  149mL OMNIPAQUE IOHEXOL 300 MG/ML  SOLN COMPARISON:  03/18/2018 FINDINGS: Lower chest: Coronary artery  calcification is evident. Hepatobiliary: No focal abnormality within the liver parenchyma. There is no evidence for gallstones, gallbladder wall thickening, or pericholecystic fluid. No intrahepatic or extrahepatic biliary dilation. Pancreas: No focal mass lesion. No dilatation of the main duct. No intraparenchymal cyst. No peripancreatic edema. Spleen: No splenomegaly. No focal mass lesion. Adrenals/Urinary Tract: No adrenal nodule or mass. Parenchymal scarring noted in the kidneys bilaterally without hydronephrosis. No evidence for hydroureter. The urinary bladder appears normal for the degree of distention. Stomach/Bowel: Stomach is nondistended. No gastric wall thickening. No evidence of outlet obstruction. Duodenum is normally positioned as is the ligament of Treitz. No small bowel wall thickening. No small bowel dilatation. The terminal ileum is normal. The appendix is normal. No gross colonic mass. No colonic wall thickening. Vascular/Lymphatic: There is abdominal aortic atherosclerosis without aneurysm. There is no gastrohepatic or hepatoduodenal ligament lymphadenopathy. No intraperitoneal or retroperitoneal lymphadenopathy. No pelvic sidewall lymphadenopathy. Surgical clips along both pelvic sidewalls suggest prior lymph node dissection. Reproductive: Prostate gland surgically absent. Other: No intraperitoneal free fluid. Musculoskeletal: The innumerable diffuse sclerotic lesions throughout the bony anatomy are similar to  prior. Index lesion measured previously in the left inferior pubic ramus at 11 x 7 mm is stable, measuring 11 x 6 mm today. IMPRESSION: 1. Stable exam. No new or progressive findings. 2. Innumerable sclerotic lesions throughout the bony anatomy consistent with metastatic disease. Size and number of the sclerotic lesions appear stable in the interval. 3.  Aortic Atherosclerois (ICD10-170.0) Electronically Signed   By: Misty Stanley M.D.   On: 08/05/2018 15:46   Nm Xofigo  Injection  Result Date: 07/21/2018  [name] was injected intravenously in Nuclear Medicine under the supervision of the attending radiologist    Labs:  CBC: Recent Labs    05/28/18 1010 06/09/18 1231 07/14/18 1234 08/05/18 1002  WBC 5.2 4.6 4.9 6.0  HGB 13.3 12.6* 12.7* 12.1*  HCT 40.4 38.1* 37.2* 36.1*  PLT 265 261 233 239    COAGS: No results for input(s): INR, APTT in the last 8760 hours.  BMP: Recent Labs    03/10/18 1141 04/03/18 1225 05/28/18 1010 08/05/18 1002  NA 139 139 138 136  K 4.2 4.7 4.3 4.3  CL 108 106 106 102  CO2 26 26 24 25   GLUCOSE 92 97 90 108*  BUN 15 19 19 15   CALCIUM 8.0* 8.7* 8.8* 8.6*  CREATININE 0.91 1.09 1.07 0.88  GFRNONAA >60 >60 >60 >60  GFRAA >60 >60 >60 >60    LIVER FUNCTION TESTS: Recent Labs    03/10/18 1141 04/03/18 1225 05/28/18 1010 08/05/18 1002  BILITOT 0.4 0.5 0.4 0.5  AST 12* 14* 14* 20  ALT 8 8 8 7   ALKPHOS 184* 154* 152* 173*  PROT 6.6 6.6 6.9 6.9  ALBUMIN 3.6 3.8 3.6 3.4*    TUMOR MARKERS: No results for input(s): AFPTM, CEA, CA199, CHROMGRNA in the last 8760 hours.  Assessment and Plan: 83 y.o. male with history of prostate cancer, initially diagnosed in 1999; now with disease progression and presents today for Port-A-Cath placement for palliative chemotherapy.Risks and benefits of image guided port-a-catheter placement was discussed with the patient/spouse including, but not limited to bleeding, infection, pneumothorax, or fibrin sheath development and need for additional procedures.  All of the patient's questions were answered, patient is agreeable to proceed. Consent signed and in chart.     Thank you for this interesting consult.  I greatly enjoyed meeting Edward Bailey and look forward to participating in their care.  A copy of this report was sent to the requesting provider on this date.  Electronically Signed: D. Rowe Robert, PA-C 08/19/2018, 12:29 PM   I spent a total of 25 minutes  in face to face in clinical consultation, greater than 50% of which was counseling/coordinating care for Port-A-Cath placement

## 2018-08-20 DIAGNOSIS — C7951 Secondary malignant neoplasm of bone: Secondary | ICD-10-CM | POA: Diagnosis not present

## 2018-08-20 DIAGNOSIS — C61 Malignant neoplasm of prostate: Secondary | ICD-10-CM | POA: Diagnosis not present

## 2018-08-21 ENCOUNTER — Inpatient Hospital Stay: Payer: Medicare Other

## 2018-08-21 VITALS — BP 136/71 | HR 76 | Temp 97.7°F | Resp 18 | Ht 74.0 in | Wt 211.8 lb

## 2018-08-21 DIAGNOSIS — C61 Malignant neoplasm of prostate: Secondary | ICD-10-CM

## 2018-08-21 DIAGNOSIS — Z923 Personal history of irradiation: Secondary | ICD-10-CM | POA: Diagnosis not present

## 2018-08-21 DIAGNOSIS — Z9079 Acquired absence of other genital organ(s): Secondary | ICD-10-CM | POA: Diagnosis not present

## 2018-08-21 DIAGNOSIS — C7951 Secondary malignant neoplasm of bone: Secondary | ICD-10-CM

## 2018-08-21 DIAGNOSIS — E0590 Thyrotoxicosis, unspecified without thyrotoxic crisis or storm: Secondary | ICD-10-CM | POA: Diagnosis not present

## 2018-08-21 DIAGNOSIS — Z79899 Other long term (current) drug therapy: Secondary | ICD-10-CM | POA: Diagnosis not present

## 2018-08-21 DIAGNOSIS — Z95828 Presence of other vascular implants and grafts: Secondary | ICD-10-CM

## 2018-08-21 DIAGNOSIS — Z7982 Long term (current) use of aspirin: Secondary | ICD-10-CM | POA: Diagnosis not present

## 2018-08-21 LAB — CMP (CANCER CENTER ONLY)
ALT: <6 U/L (ref 0–44)
AST: 18 U/L (ref 15–41)
Albumin: 3.2 g/dL — ABNORMAL LOW (ref 3.5–5.0)
Alkaline Phosphatase: 207 U/L — ABNORMAL HIGH (ref 38–126)
Anion gap: 6 (ref 5–15)
BUN: 13 mg/dL (ref 8–23)
CO2: 23 mmol/L (ref 22–32)
Calcium: 8.3 mg/dL — ABNORMAL LOW (ref 8.9–10.3)
Chloride: 104 mmol/L (ref 98–111)
Creatinine: 0.81 mg/dL (ref 0.61–1.24)
GFR, Est AFR Am: >60 mL/min (ref >60)
Glucose, Bld: 97 mg/dL (ref 70–99)
Potassium: 3.7 mmol/L (ref 3.5–5.1)
SODIUM: 133 mmol/L — AB (ref 135–145)
Total Bilirubin: 0.4 mg/dL (ref 0.3–1.2)
Total Protein: 6.5 g/dL (ref 6.5–8.1)

## 2018-08-21 LAB — CBC WITH DIFFERENTIAL (CANCER CENTER ONLY)
Abs Immature Granulocytes: 0.09 10*3/uL — ABNORMAL HIGH (ref 0.00–0.07)
Basophils Absolute: 0 10*3/uL (ref 0.0–0.1)
Basophils Relative: 0 %
Eosinophils Absolute: 0.1 10*3/uL (ref 0.0–0.5)
Eosinophils Relative: 1 %
HCT: 32.2 % — ABNORMAL LOW (ref 39.0–52.0)
Hemoglobin: 10.8 g/dL — ABNORMAL LOW (ref 13.0–17.0)
Immature Granulocytes: 1 %
Lymphocytes Relative: 7 %
Lymphs Abs: 0.6 10*3/uL — ABNORMAL LOW (ref 0.7–4.0)
MCH: 30 pg (ref 26.0–34.0)
MCHC: 33.5 g/dL (ref 30.0–36.0)
MCV: 89.4 fL (ref 80.0–100.0)
Monocytes Absolute: 1 10*3/uL (ref 0.1–1.0)
Monocytes Relative: 13 %
Neutro Abs: 6.1 10*3/uL (ref 1.7–7.7)
Neutrophils Relative %: 78 %
Platelet Count: 283 10*3/uL (ref 150–400)
RBC: 3.6 MIL/uL — AB (ref 4.22–5.81)
RDW: 14 % (ref 11.5–15.5)
WBC Count: 7.8 10*3/uL (ref 4.0–10.5)
nRBC: 0 % (ref 0.0–0.2)

## 2018-08-21 MED ORDER — HEPARIN SOD (PORK) LOCK FLUSH 100 UNIT/ML IV SOLN
500.0000 [IU] | Freq: Once | INTRAVENOUS | Status: AC | PRN
  Administered 2018-08-21: 500 [IU]
  Filled 2018-08-21: qty 5

## 2018-08-21 MED ORDER — DEXAMETHASONE SODIUM PHOSPHATE 10 MG/ML IJ SOLN
INTRAMUSCULAR | Status: AC
  Filled 2018-08-21: qty 1

## 2018-08-21 MED ORDER — SODIUM CHLORIDE 0.9 % IV SOLN
Freq: Once | INTRAVENOUS | Status: AC
  Administered 2018-08-21: 13:00:00 via INTRAVENOUS
  Filled 2018-08-21: qty 250

## 2018-08-21 MED ORDER — DEXAMETHASONE SODIUM PHOSPHATE 10 MG/ML IJ SOLN
10.0000 mg | Freq: Once | INTRAMUSCULAR | Status: AC
  Administered 2018-08-21: 10 mg via INTRAVENOUS

## 2018-08-21 MED ORDER — SODIUM CHLORIDE 0.9 % IV SOLN
75.0000 mg/m2 | Freq: Once | INTRAVENOUS | Status: AC
  Administered 2018-08-21: 170 mg via INTRAVENOUS
  Filled 2018-08-21: qty 17

## 2018-08-21 MED ORDER — SODIUM CHLORIDE 0.9% FLUSH
10.0000 mL | INTRAVENOUS | Status: DC | PRN
  Administered 2018-08-21: 10 mL via INTRAVENOUS
  Filled 2018-08-21: qty 10

## 2018-08-21 MED ORDER — SODIUM CHLORIDE 0.9% FLUSH
10.0000 mL | INTRAVENOUS | Status: DC | PRN
  Administered 2018-08-21: 10 mL
  Filled 2018-08-21: qty 10

## 2018-08-21 NOTE — Patient Instructions (Signed)
[facility] Discharge Instructions for Patients Receiving Chemotherapy  Today you received the following chemotherapy agents Docetaxel (TAXOTERE).  To help prevent nausea and vomiting after your treatment, we encourage you to take your nausea medication as prescribed.   If you develop nausea and vomiting that is not controlled by your nausea medication, call the clinic.   BELOW ARE SYMPTOMS THAT SHOULD BE REPORTED IMMEDIATELY:  *FEVER GREATER THAN 100.5 F  *CHILLS WITH OR WITHOUT FEVER  NAUSEA AND VOMITING THAT IS NOT CONTROLLED WITH YOUR NAUSEA MEDICATION  *UNUSUAL SHORTNESS OF BREATH  *UNUSUAL BRUISING OR BLEEDING  TENDERNESS IN MOUTH AND THROAT WITH OR WITHOUT PRESENCE OF ULCERS  *URINARY PROBLEMS  *BOWEL PROBLEMS  UNUSUAL RASH Items with * indicate a potential emergency and should be followed up as soon as possible.  Feel free to call the clinic should you have any questions or concerns. The clinic phone number is [phone].  Please show the [facility] at check-in to the Emergency Department and triage nurse.  Docetaxel injection What is this medicine? DOCETAXEL (doe se TAX el) is a chemotherapy drug. It targets fast dividing cells, like cancer cells, and causes these cells to die. This medicine is used to treat many types of cancers like breast cancer, certain stomach cancers, head and neck cancer, lung cancer, and prostate cancer. This medicine may be used for other purposes; ask your health care provider or pharmacist if you have questions. COMMON BRAND NAME(S): Docefrez, Taxotere What should I tell my health care provider before I take this medicine? They need to know if you have any of these conditions: -infection (especially a virus infection such as chickenpox, cold sores, or herpes) -liver disease -low blood counts, like low white cell, platelet, or red cell counts -an unusual or allergic reaction to docetaxel, polysorbate 80, other  chemotherapy agents, other medicines, foods, dyes, or preservatives -pregnant or trying to get pregnant -breast-feeding How should I use this medicine? This drug is given as an infusion into a vein. It is administered in a hospital or clinic by a specially trained health care professional. Talk to your pediatrician regarding the use of this medicine in children. Special care may be needed. Overdosage: If you think you have taken too much of this medicine contact a poison control center or emergency room at once. NOTE: This medicine is only for you. Do not share this medicine with others. What if I miss a dose? It is important not to miss your dose. Call your doctor or health care professional if you are unable to keep an appointment. What may interact with this medicine? -cyclosporine -erythromycin -ketoconazole -medicines to increase blood counts like filgrastim, pegfilgrastim, sargramostim -vaccines Talk to your doctor or health care professional before taking any of these medicines: -acetaminophen -aspirin -ibuprofen -ketoprofen -naproxen This list may not describe all possible interactions. Give your health care provider a list of all the medicines, herbs, non-prescription drugs, or dietary supplements you use. Also tell them if you smoke, drink alcohol, or use illegal drugs. Some items may interact with your medicine. What should I watch for while using this medicine? Your condition will be monitored carefully while you are receiving this medicine. You will need important blood work done while you are taking this medicine. This drug may make you feel generally unwell. This is not uncommon, as chemotherapy can affect healthy cells as well as cancer cells. Report any side effects. Continue your course of treatment even though you feel ill  unless your doctor tells you to stop. In some cases, you may be given additional medicines to help with side effects. Follow all directions for their  use. Call your doctor or health care professional for advice if you get a fever, chills or sore throat, or other symptoms of a cold or flu. Do not treat yourself. This drug decreases your body's ability to fight infections. Try to avoid being around people who are sick. This medicine may increase your risk to bruise or bleed. Call your doctor or health care professional if you notice any unusual bleeding. This medicine may contain alcohol in the product. You may get drowsy or dizzy. Do not drive, use machinery, or do anything that needs mental alertness until you know how this medicine affects you. Do not stand or sit up quickly, especially if you are an older patient. This reduces the risk of dizzy or fainting spells. Avoid alcoholic drinks. Do not become pregnant while taking this medicine or for 6 months after stopping it. Women should inform their doctor if they wish to become pregnant or think they might be pregnant. Men should not father a child while taking this medicine and for 3 months after stopping it. There is a potential for serious side effects to an unborn child. Talk to your health care professional or pharmacist for more information. Do not breast-feed an infant while taking this medicine or for 2 weeks after stopping it. This may interfere with the ability to father a child. You should talk to your doctor or health care professional if you are concerned about your fertility. What side effects may I notice from receiving this medicine? Side effects that you should report to your doctor or health care professional as soon as possible: -allergic reactions like skin rash, itching or hives, swelling of the face, lips, or tongue -low blood counts - This drug may decrease the number of white blood cells, red blood cells and platelets. You may be at increased risk for infections and bleeding. -signs of infection - fever or chills, cough, sore throat, pain or difficulty passing urine -signs of  decreased platelets or bleeding - bruising, pinpoint red spots on the skin, black, tarry stools, nosebleeds -signs of decreased red blood cells - unusually weak or tired, fainting spells, lightheadedness -breathing problems -fast or irregular heartbeat -low blood pressure -mouth sores -nausea and vomiting -pain, swelling, redness or irritation at the injection site -pain, tingling, numbness in the hands or feet -swelling of the ankle, feet, hands -weight gain Side effects that usually do not require medical attention (report to your doctor or health care professional if they continue or are bothersome): -bone pain -complete hair loss including hair on your head, underarms, pubic hair, eyebrows, and eyelashes -diarrhea -excessive tearing -changes in the color of fingernails -loosening of the fingernails -nausea -muscle pain -red flush to skin -sweating -weak or tired This list may not describe all possible side effects. Call your doctor for medical advice about side effects. You may report side effects to FDA at 1-800-FDA-1088. Where should I keep my medicine? This drug is given in a hospital or clinic and will not be stored at home. NOTE: This sheet is a summary. It may not cover all possible information. If you have questions about this medicine, talk to your doctor, pharmacist, or health care provider.  2019 Elsevier/Gold Standard (2017-08-05 12:07:21)

## 2018-08-22 ENCOUNTER — Telehealth: Payer: Self-pay | Admitting: *Deleted

## 2018-08-22 LAB — PROSTATE-SPECIFIC AG, SERUM (LABCORP): Prostate Specific Ag, Serum: 713 ng/mL — ABNORMAL HIGH (ref 0.0–4.0)

## 2018-08-22 MED ORDER — LIDOCAINE-PRILOCAINE 2.5-2.5 % EX CREA
TOPICAL_CREAM | CUTANEOUS | Status: AC
  Filled 2018-08-22: qty 5

## 2018-08-22 NOTE — Telephone Encounter (Signed)
-----   Message from Georgianne Fick, RN sent at 08/21/2018  4:14 PM EST ----- Regarding: Dr. Alen Blew First Time Docetaxel (TAXOTERE) Patient received first time Docetaxel (TAXOTERE) today and tolerated this well.

## 2018-08-22 NOTE — Telephone Encounter (Signed)
Patient states he is doing well after his first taxotere treatment. Eating and drinking well. C/o constipation. Suggested stool softners, daily along with miralax and have mag citrate on hand. Drink 1/2 bottle, if no results, drink remaining half.

## 2018-08-23 ENCOUNTER — Inpatient Hospital Stay: Payer: Medicare Other | Attending: Oncology

## 2018-08-23 VITALS — BP 118/69 | HR 98 | Temp 97.7°F | Resp 18

## 2018-08-23 DIAGNOSIS — Z5111 Encounter for antineoplastic chemotherapy: Secondary | ICD-10-CM | POA: Insufficient documentation

## 2018-08-23 DIAGNOSIS — E0590 Thyrotoxicosis, unspecified without thyrotoxic crisis or storm: Secondary | ICD-10-CM | POA: Diagnosis not present

## 2018-08-23 DIAGNOSIS — Z5189 Encounter for other specified aftercare: Secondary | ICD-10-CM | POA: Diagnosis not present

## 2018-08-23 DIAGNOSIS — C61 Malignant neoplasm of prostate: Secondary | ICD-10-CM | POA: Diagnosis not present

## 2018-08-23 DIAGNOSIS — C7951 Secondary malignant neoplasm of bone: Secondary | ICD-10-CM

## 2018-08-23 MED ORDER — PEGFILGRASTIM-CBQV 6 MG/0.6ML ~~LOC~~ SOSY
PREFILLED_SYRINGE | SUBCUTANEOUS | Status: AC
  Filled 2018-08-23: qty 0.6

## 2018-08-23 MED ORDER — PEGFILGRASTIM-CBQV 6 MG/0.6ML ~~LOC~~ SOSY
6.0000 mg | PREFILLED_SYRINGE | Freq: Once | SUBCUTANEOUS | Status: AC
  Administered 2018-08-23: 6 mg via SUBCUTANEOUS

## 2018-08-23 NOTE — Patient Instructions (Signed)
Constipation, Adult Constipation is when a person:  Poops (has a bowel movement) fewer times in a week than normal.  Has a hard time pooping.  Has poop that is dry, hard, or bigger than normal. Follow these instructions at home: Eating and drinking   Eat foods that have a lot of fiber, such as: ? Fresh fruits and vegetables. ? Whole grains. ? Beans.  Eat less of foods that are high in fat, low in fiber, or overly processed, such as: ? [place] fries. ? Hamburgers. ? Cookies. ? Candy. ? Soda.  Drink enough fluid to keep your pee (urine) clear or pale yellow. General instructions  Exercise regularly or as told by your doctor.  Go to the restroom when you feel like you need to poop. Do not hold it in.  Take over-the-counter and prescription medicines only as told by your doctor. These include any fiber supplements.  Do pelvic floor retraining exercises, such as: ? Doing deep breathing while relaxing your lower belly (abdomen). ? Relaxing your pelvic floor while pooping.  Watch your condition for any changes.  Keep all follow-up visits as told by your doctor. This is important. Contact a doctor if:  You have pain that gets worse.  You have a fever.  You have not pooped for 4 days.  You throw up (vomit).  You are not hungry.  You lose weight.  You are bleeding from the anus.  You have thin, pencil-like poop (stool). Get help right away if:  You have a fever, and your symptoms suddenly get worse.  You leak poop or have blood in your poop.  Your belly feels hard or bigger than normal (is bloated).  You have very bad belly pain.  You feel dizzy or you faint. This information is not intended to replace advice given to you by your health care provider. Make sure you discuss any questions you have with your health care provider. Document Released: 12/26/2007 Document Revised: 01/27/2016 Document Reviewed: 12/28/2015 Elsevier Interactive Patient Education   2019 [employer].   Pegfilgrastim injection Ellen Henri) What is this medicine? PEGFILGRASTIM (PEG fil gra stim) is a long-acting granulocyte colony-stimulating factor that stimulates the growth of neutrophils, a type of white blood cell important in the body's fight against infection. It is used to reduce the incidence of fever and infection in patients with certain types of cancer who are receiving chemotherapy that affects the bone marrow, and to increase survival after being exposed to high doses of radiation. This medicine may be used for other purposes; ask your health care provider or pharmacist if you have questions. COMMON BRAND NAME(S): Domenic Moras, UDENYCA What should I tell my health care provider before I take this medicine? They need to know if you have any of these conditions: -kidney disease -latex allergy -ongoing radiation therapy -sickle cell disease -skin reactions to acrylic adhesives (On-Body Injector only) -an unusual or allergic reaction to pegfilgrastim, filgrastim, other medicines, foods, dyes, or preservatives -pregnant or trying to get pregnant -breast-feeding How should I use this medicine? This medicine is for injection under the skin. If you get this medicine at home, you will be taught how to prepare and give the pre-filled syringe or how to use the On-body Injector. Refer to the patient Instructions for Use for detailed instructions. Use exactly as directed. Tell your healthcare provider immediately if you suspect that the On-body Injector may not have performed as intended or if you suspect the use of the On-body Injector resulted in  a missed or partial dose. It is important that you put your used needles and syringes in a special sharps container. Do not put them in a trash can. If you do not have a sharps container, call your pharmacist or healthcare provider to get one. Talk to your pediatrician regarding the use of this medicine in children. While this  drug may be prescribed for selected conditions, precautions do apply. Overdosage: If you think you have taken too much of this medicine contact a poison control center or emergency room at once. NOTE: This medicine is only for you. Do not share this medicine with others. What if I miss a dose? It is important not to miss your dose. Call your doctor or health care professional if you miss your dose. If you miss a dose due to an On-body Injector failure or leakage, a new dose should be administered as soon as possible using a single prefilled syringe for manual use. What may interact with this medicine? Interactions have not been studied. Give your health care provider a list of all the medicines, herbs, non-prescription drugs, or dietary supplements you use. Also tell them if you smoke, drink alcohol, or use illegal drugs. Some items may interact with your medicine. This list may not describe all possible interactions. Give your health care provider a list of all the medicines, herbs, non-prescription drugs, or dietary supplements you use. Also tell them if you smoke, drink alcohol, or use illegal drugs. Some items may interact with your medicine. What should I watch for while using this medicine? You may need blood work done while you are taking this medicine. If you are going to need a MRI, CT scan, or other procedure, tell your doctor that you are using this medicine (On-Body Injector only). What side effects may I notice from receiving this medicine? Side effects that you should report to your doctor or health care professional as soon as possible: -allergic reactions like skin rash, itching or hives, swelling of the face, lips, or tongue -back pain -dizziness -fever -pain, redness, or irritation at site where injected -pinpoint red spots on the skin -red or dark-brown urine -shortness of breath or breathing problems -stomach or side pain, or pain at the  shoulder -swelling -tiredness -trouble passing urine or change in the amount of urine Side effects that usually do not require medical attention (report to your doctor or health care professional if they continue or are bothersome): -bone pain -muscle pain This list may not describe all possible side effects. Call your doctor for medical advice about side effects. You may report side effects to FDA at 1-800-FDA-1088. Where should I keep my medicine? Keep out of the reach of children. If you are using this medicine at home, you will be instructed on how to store it. Throw away any unused medicine after the expiration date on the label. NOTE: This sheet is a summary. It may not cover all possible information. If you have questions about this medicine, talk to your doctor, pharmacist, or health care provider.  2019 Elsevier/Gold Standard (2017-10-14 16:57:08)

## 2018-08-25 ENCOUNTER — Telehealth: Payer: Self-pay

## 2018-08-25 NOTE — Telephone Encounter (Signed)
Patient called and stated that he has been having diarrhea since Saturday. He stated that initially he was having constipation and feels like all the medications he took to relieve the constipation have caused the diarrhea. He stated that he has been drinking plenty of fluids. Discussed low residue diet and to drink plenty of fluids. Explained that can purchase Imodium OTC but to be careful as to how many doses are taken to avoid constipation. Advised patient that if the Imodium is not effective for the diarrhea and it continues to call back because diarrhea can lead to dehydration. Patient verbalized understanding and had no other questions or concerns.

## 2018-08-29 ENCOUNTER — Telehealth: Payer: Self-pay

## 2018-08-29 NOTE — Telephone Encounter (Signed)
Received message that patient contacted on-call to see if he can take Coricidin for congestion while receiving Taxotere. Informed patient that per Dr. Alen Blew he can take the Coricidin for congestion. Patient had no other questions or concerns

## 2018-09-12 ENCOUNTER — Inpatient Hospital Stay: Payer: Medicare Other | Admitting: Oncology

## 2018-09-12 ENCOUNTER — Inpatient Hospital Stay: Payer: Medicare Other

## 2018-09-12 ENCOUNTER — Telehealth: Payer: Self-pay | Admitting: Oncology

## 2018-09-12 VITALS — BP 132/65 | HR 80 | Temp 97.8°F | Resp 18 | Ht 74.0 in | Wt 210.6 lb

## 2018-09-12 DIAGNOSIS — C7951 Secondary malignant neoplasm of bone: Secondary | ICD-10-CM

## 2018-09-12 DIAGNOSIS — C61 Malignant neoplasm of prostate: Secondary | ICD-10-CM

## 2018-09-12 DIAGNOSIS — E0590 Thyrotoxicosis, unspecified without thyrotoxic crisis or storm: Secondary | ICD-10-CM

## 2018-09-12 DIAGNOSIS — Z95828 Presence of other vascular implants and grafts: Secondary | ICD-10-CM

## 2018-09-12 DIAGNOSIS — Z5189 Encounter for other specified aftercare: Secondary | ICD-10-CM | POA: Diagnosis not present

## 2018-09-12 DIAGNOSIS — Z5111 Encounter for antineoplastic chemotherapy: Secondary | ICD-10-CM | POA: Diagnosis not present

## 2018-09-12 LAB — CBC WITH DIFFERENTIAL (CANCER CENTER ONLY)
Abs Immature Granulocytes: 0.05 10*3/uL (ref 0.00–0.07)
Basophils Absolute: 0.1 10*3/uL (ref 0.0–0.1)
Basophils Relative: 1 %
EOS PCT: 2 %
Eosinophils Absolute: 0.1 10*3/uL (ref 0.0–0.5)
HCT: 31.3 % — ABNORMAL LOW (ref 39.0–52.0)
Hemoglobin: 10.3 g/dL — ABNORMAL LOW (ref 13.0–17.0)
Immature Granulocytes: 1 %
LYMPHS PCT: 8 %
Lymphs Abs: 0.5 10*3/uL — ABNORMAL LOW (ref 0.7–4.0)
MCH: 30.4 pg (ref 26.0–34.0)
MCHC: 32.9 g/dL (ref 30.0–36.0)
MCV: 92.3 fL (ref 80.0–100.0)
Monocytes Absolute: 0.7 10*3/uL (ref 0.1–1.0)
Monocytes Relative: 10 %
NEUTROS ABS: 5.3 10*3/uL (ref 1.7–7.7)
Neutrophils Relative %: 78 %
Platelet Count: 256 10*3/uL (ref 150–400)
RBC: 3.39 MIL/uL — ABNORMAL LOW (ref 4.22–5.81)
RDW: 16.6 % — ABNORMAL HIGH (ref 11.5–15.5)
WBC Count: 6.7 10*3/uL (ref 4.0–10.5)
nRBC: 0 % (ref 0.0–0.2)

## 2018-09-12 LAB — CMP (CANCER CENTER ONLY)
ALT: 7 U/L (ref 0–44)
AST: 14 U/L — ABNORMAL LOW (ref 15–41)
Albumin: 3.4 g/dL — ABNORMAL LOW (ref 3.5–5.0)
Alkaline Phosphatase: 181 U/L — ABNORMAL HIGH (ref 38–126)
Anion gap: 9 (ref 5–15)
BUN: 17 mg/dL (ref 8–23)
CO2: 21 mmol/L — ABNORMAL LOW (ref 22–32)
CREATININE: 0.93 mg/dL (ref 0.61–1.24)
Calcium: 8.2 mg/dL — ABNORMAL LOW (ref 8.9–10.3)
Chloride: 106 mmol/L (ref 98–111)
GFR, Est AFR Am: >60 mL/min (ref >60)
GFR, Estimated: >60 mL/min (ref >60)
Glucose, Bld: 82 mg/dL (ref 70–99)
Potassium: 4.5 mmol/L (ref 3.5–5.1)
Sodium: 136 mmol/L (ref 135–145)
Total Bilirubin: 0.4 mg/dL (ref 0.3–1.2)
Total Protein: 6.2 g/dL — ABNORMAL LOW (ref 6.5–8.1)

## 2018-09-12 MED ORDER — HEPARIN SOD (PORK) LOCK FLUSH 100 UNIT/ML IV SOLN
500.0000 [IU] | Freq: Once | INTRAVENOUS | Status: AC | PRN
  Administered 2018-09-12: 500 [IU]
  Filled 2018-09-12: qty 5

## 2018-09-12 MED ORDER — SODIUM CHLORIDE 0.9% FLUSH
10.0000 mL | INTRAVENOUS | Status: DC | PRN
  Administered 2018-09-12: 10 mL
  Filled 2018-09-12: qty 10

## 2018-09-12 MED ORDER — SODIUM CHLORIDE 0.9 % IV SOLN
Freq: Once | INTRAVENOUS | Status: AC
  Administered 2018-09-12: 13:00:00 via INTRAVENOUS
  Filled 2018-09-12: qty 250

## 2018-09-12 MED ORDER — SODIUM CHLORIDE 0.9% FLUSH
10.0000 mL | Freq: Once | INTRAVENOUS | Status: AC
  Administered 2018-09-12: 10 mL
  Filled 2018-09-12: qty 10

## 2018-09-12 MED ORDER — SODIUM CHLORIDE 0.9 % IV SOLN
75.0000 mg/m2 | Freq: Once | INTRAVENOUS | Status: AC
  Administered 2018-09-12: 170 mg via INTRAVENOUS
  Filled 2018-09-12: qty 17

## 2018-09-12 MED ORDER — DEXAMETHASONE SODIUM PHOSPHATE 10 MG/ML IJ SOLN
10.0000 mg | Freq: Once | INTRAMUSCULAR | Status: AC
  Administered 2018-09-12: 10 mg via INTRAVENOUS

## 2018-09-12 MED ORDER — DEXAMETHASONE SODIUM PHOSPHATE 10 MG/ML IJ SOLN
INTRAMUSCULAR | Status: AC
  Filled 2018-09-12: qty 1

## 2018-09-12 NOTE — Telephone Encounter (Signed)
Gave avs and calendar ° °

## 2018-09-12 NOTE — Progress Notes (Signed)
Hematology and Oncology Follow Up Visit  Edward Bailey [id number] 19-Feb-1936 83 y.o. 09/12/2018 11:48 AM [name], MDGriffin, Edward Reichmann, MD   Principle Diagnosis: 83 year old man with castration-resistant prostate cancer with disease to the bone diagnosed in 2017.   He was initially diagnosed in 1999 with Gleason score 3+4 = 7 and a PSA of 4.  .   Prior Therapy:  He is status post prostatectomy and found to have stage T3b disease. He received salvage radiation therapy for a rise in his PSA.  He developed biochemical relapse and was treated with androgen depravation intermittently between 2010 to about 2015.  He developed castration resistant disease in 2015.  Ketoconazole at 200 mg twice a day and prednisone 5 mg daily. Therapy discontinued on 05/24/2016 because of progression of disease. Zytiga 1000 mg daily with prednisone at 5 mg daily started in November 2017. Xtandi 160 mg daily started in April 2019 therapy discontinued due to progression of disease and July 2019. [name] started in July 2019.  He completed 6 months of therapy in December 2019. Xtandi 160 mg resumed in August 2019 after brief discontinuation in July 2019.  Therapy discontinued permanently in January 2020 for progression of disease.  Current therapy: Taxotere chemotherapy started on August 21, 2018 with 75 mg/m.  He is here for cycle 2 of therapy.    Interim History:  Edward Bailey is here for repeat evaluation.  Since the last visit, he received the first cycle of chemotherapy without any major complications.  He denies any nausea, vomiting or infusion related complications.  He denies any worsening neuropathy or excessive fatigue.  Performance status and quality of life remains unaffected.  He does report some fatigue and unsteadiness but no falls or syncope.  He denies using a walker or cane.  His appetite and performance status remains unchanged.   Patient denied any alteration mental status, neuropathy,  confusion or dizziness.  Denies any headaches or lethargy.  Denies any night sweats, weight loss or changes in appetite.  Denied orthopnea, dyspnea on exertion or chest discomfort.  Denies shortness of breath, difficulty breathing hemoptysis or cough.  Denies any abdominal distention, nausea, early satiety or dyspepsia.  Denies any hematuria, frequency, dysuria or nocturia.  Denies any skin irritation, dryness or rash.  Denies any ecchymosis or petechiae.  Denies any lymphadenopathy or clotting.  Denies any heat or cold intolerance.  Denies any anxiety or depression.  Remaining review of system is negative.       Medications: I have reviewed the patient's current medications.  Current Outpatient Medications  Medication Sig Dispense Refill  . amLODipine (NORVASC) 5 MG tablet TK 1 T PO QD  3  . aspirin (ASPIRIN EC) 81 MG EC tablet Take 81 mg by mouth daily. Swallow whole.    Marland Kitchen azelastine (ASTELIN) 0.1 % nasal spray Place 1-2 sprays into both nostrils daily as needed. allergies  5  . cetirizine (ZYRTEC) 10 MG tablet Take 10 mg by mouth daily as needed for allergies. As needed    . fluticasone (FLONASE) 50 MCG/ACT nasal spray Place 1 spray into both nostrils daily as needed. Allergies  5  . levothyroxine (SYNTHROID, LEVOTHROID) 112 MCG tablet TK 1 T PO QD IN THE MORNING OES  12  . levothyroxine (SYNTHROID, LEVOTHROID) 125 MCG tablet Alternates with 112 mcg  9  . lidocaine-prilocaine (EMLA) cream Apply 1 application topically as needed. 30 g 0  . Multiple Vitamins-Minerals (MULTIVITAMIN PO) Take 1 capsule by mouth daily.    Marland Kitchen  prochlorperazine (COMPAZINE) 10 MG tablet Take 1 tablet (10 mg total) by mouth every 6 (six) hours as needed for nausea or vomiting. 30 tablet 0  . XTANDI 40 MG capsule TAKE 4 CAPSULES (160 MG TOTAL) BY MOUTH DAILY. 120 capsule 0   No current facility-administered medications for this visit.      Allergies:  Allergies  Allergen Reactions  . Amoxicillin Rash    Has  patient had a PCN reaction causing immediate rash, facial/tongue/throat swelling, SOB or lightheadedness with hypotension: No Has patient had a PCN reaction causing severe rash involving mucus membranes or skin necrosis: No Has patient had a PCN reaction that required hospitalization No Has patient had a PCN reaction occurring within the last 10 years: Yes If all of the above answers are "NO", then may proceed with Cephalosporin use.     Past Medical History, Surgical history, Social history, and Family History were reviewed and updated.   Physical Exam:   Blood pressure 132/65, pulse 80, temperature 97.8 F (36.6 C), temperature source Oral, resp. rate 18, height 6\' 2"  (1.88 m), weight 210 lb 9.6 oz (95.5 kg), SpO2 100 %.    ECOG: 1   General appearance: Alert, awake without any distress. Head: Atraumatic without abnormalities Oropharynx: Without any thrush or ulcers. Eyes: No scleral icterus. Lymph nodes: No lymphadenopathy noted in the cervical, supraclavicular, or axillary nodes Heart:regular rate and rhythm, without any murmurs or gallops.   Lung: Clear to auscultation without any rhonchi, wheezes or dullness to percussion. Abdomin: Soft, nontender without any shifting dullness or ascites. Musculoskeletal: No clubbing or cyanosis. Neurological: No motor or sensory deficits. Skin: No rashes or lesions.      Lab Results: Lab Results  Component Value Date   WBC 7.8 08/21/2018   HGB 10.8 (L) 08/21/2018   HCT 32.2 (L) 08/21/2018   MCV 89.4 08/21/2018   PLT 283 08/21/2018     Chemistry      Component Value Date/Time   NA 133 (L) 08/21/2018 1128   NA 139 06/07/2017 1057   K 3.7 08/21/2018 1128   K 3.6 06/07/2017 1057   CL 104 08/21/2018 1128   CO2 23 08/21/2018 1128   CO2 26 06/07/2017 1057   BUN 13 08/21/2018 1128   BUN 18.5 06/07/2017 1057   CREATININE 0.81 08/21/2018 1128   CREATININE 1.2 06/07/2017 1057      Component Value Date/Time   CALCIUM 8.3 (L)  08/21/2018 1128   CALCIUM 9.0 06/07/2017 1057   ALKPHOS 207 (H) 08/21/2018 1128   ALKPHOS 112 06/07/2017 1057   AST 18 08/21/2018 1128   AST 14 06/07/2017 1057   ALT <6 08/21/2018 1128   ALT 12 06/07/2017 1057   BILITOT 0.4 08/21/2018 1128   BILITOT 0.66 06/07/2017 1057      Results for NEDIM, OKI (MRN [id number]) as of 09/12/2018 11:53  Ref. Range 08/05/2018 10:02 08/21/2018 11:28  Prostate Specific Ag, Serum Latest Ref Range: 0.0 - 4.0 ng/mL 623.0 (H) 713.0 (H)     Impression and Plan:  83 year old man with  1.  Advanced prostate cancer with disease to the bone that is currently castration-resistant and has progressed on multiple therapies.   Is currently receiving Taxotere chemotherapy with first cycle completed in January 2020.  Risks and benefits of continuing this approach for total of 10 cycles was discussed today.  Complications associated with this treatment were reiterated including nausea, fatigue and myelosuppression.  After discussion today is agreeable to proceed.  His  counts are adequate and his PSA is currently pending.      2. Androgen depravation: Long-term androgen deprivation therapy was discussed today and I have recommended continuing this approach.  3.  Bone directed therapy: I recommended continuing Xgeva at this time given his extensive bony disease.   4. Hyperthyroidism: No recent exacerbations at this time.  5.  IV access: Port-A-Cath inserted without any complications.  6.  Antiemetics: Prescription for Compazine was made available to him.  7.  Neutropenia prophylaxis: This will be received after each cycle of chemotherapy.  His risk of neutropenia is high.  8.  Prognosis and goals of care: Treatment remains palliative although aggressive therapy is warranted given his excellent performance status.  9. Followup: We will be in 3 weeks for the next cycle of chemotherapy..   25 minutes was spent with the patient face-to-face today.  More  than 50% of time was dedicated to discussing the natural course of this disease, treatment options and answering questions regarding future plan of care.  Zola Button, MD 2/21/202011:48 AM

## 2018-09-12 NOTE — Addendum Note (Signed)
Addended by: Scot Dock on: 09/12/2018 12:49 PM   Modules accepted: Orders

## 2018-09-13 LAB — PROSTATE-SPECIFIC AG, SERUM (LABCORP): Prostate Specific Ag, Serum: 587 ng/mL — ABNORMAL HIGH (ref 0.0–4.0)

## 2018-09-15 ENCOUNTER — Inpatient Hospital Stay: Payer: Medicare Other

## 2018-09-15 DIAGNOSIS — Z5111 Encounter for antineoplastic chemotherapy: Secondary | ICD-10-CM | POA: Diagnosis not present

## 2018-09-15 DIAGNOSIS — C7951 Secondary malignant neoplasm of bone: Secondary | ICD-10-CM | POA: Diagnosis not present

## 2018-09-15 DIAGNOSIS — C61 Malignant neoplasm of prostate: Secondary | ICD-10-CM | POA: Diagnosis not present

## 2018-09-15 DIAGNOSIS — Z5189 Encounter for other specified aftercare: Secondary | ICD-10-CM | POA: Diagnosis not present

## 2018-09-15 DIAGNOSIS — E0590 Thyrotoxicosis, unspecified without thyrotoxic crisis or storm: Secondary | ICD-10-CM | POA: Diagnosis not present

## 2018-09-15 MED ORDER — PEGFILGRASTIM-CBQV 6 MG/0.6ML ~~LOC~~ SOSY
6.0000 mg | PREFILLED_SYRINGE | Freq: Once | SUBCUTANEOUS | Status: AC
  Administered 2018-09-15: 6 mg via SUBCUTANEOUS

## 2018-09-15 MED ORDER — PEGFILGRASTIM-CBQV 6 MG/0.6ML ~~LOC~~ SOSY
PREFILLED_SYRINGE | SUBCUTANEOUS | Status: AC
  Filled 2018-09-15: qty 0.6

## 2018-09-15 NOTE — Patient Instructions (Signed)
Pegfilgrastim injection  What is this medicine?  PEGFILGRASTIM (PEG fil gra stim) is a long-acting granulocyte colony-stimulating factor that stimulates the growth of neutrophils, a type of white blood cell important in the body's fight against infection. It is used to reduce the incidence of fever and infection in patients with certain types of cancer who are receiving chemotherapy that affects the bone marrow, and to increase survival after being exposed to high doses of radiation.  This medicine may be used for other purposes; ask your health care provider or pharmacist if you have questions.  COMMON BRAND NAME(S): Fulphila, Neulasta, UDENYCA  What should I tell my health care provider before I take this medicine?  They need to know if you have any of these conditions:  -kidney disease  -latex allergy  -ongoing radiation therapy  -sickle cell disease  -skin reactions to acrylic adhesives (On-Body Injector only)  -an unusual or allergic reaction to pegfilgrastim, filgrastim, other medicines, foods, dyes, or preservatives  -pregnant or trying to get pregnant  -breast-feeding  How should I use this medicine?  This medicine is for injection under the skin. If you get this medicine at home, you will be taught how to prepare and give the pre-filled syringe or how to use the On-body Injector. Refer to the patient Instructions for Use for detailed instructions. Use exactly as directed. Tell your healthcare provider immediately if you suspect that the On-body Injector may not have performed as intended or if you suspect the use of the On-body Injector resulted in a missed or partial dose.  It is important that you put your used needles and syringes in a special sharps container. Do not put them in a trash can. If you do not have a sharps container, call your pharmacist or healthcare provider to get one.  Talk to your pediatrician regarding the use of this medicine in children. While this drug may be prescribed for  selected conditions, precautions do apply.  Overdosage: If you think you have taken too much of this medicine contact a poison control center or emergency room at once.  NOTE: This medicine is only for you. Do not share this medicine with others.  What if I miss a dose?  It is important not to miss your dose. Call your doctor or health care professional if you miss your dose. If you miss a dose due to an On-body Injector failure or leakage, a new dose should be administered as soon as possible using a single prefilled syringe for manual use.  What may interact with this medicine?  Interactions have not been studied.  Give your health care provider a list of all the medicines, herbs, non-prescription drugs, or dietary supplements you use. Also tell them if you smoke, drink alcohol, or use illegal drugs. Some items may interact with your medicine.  This list may not describe all possible interactions. Give your health care provider a list of all the medicines, herbs, non-prescription drugs, or dietary supplements you use. Also tell them if you smoke, drink alcohol, or use illegal drugs. Some items may interact with your medicine.  What should I watch for while using this medicine?  You may need blood work done while you are taking this medicine.  If you are going to need a MRI, CT scan, or other procedure, tell your doctor that you are using this medicine (On-Body Injector only).  What side effects may I notice from receiving this medicine?  Side effects that you should report to   your doctor or health care professional as soon as possible:  -allergic reactions like skin rash, itching or hives, swelling of the face, lips, or tongue  -back pain  -dizziness  -fever  -pain, redness, or irritation at site where injected  -pinpoint red spots on the skin  -red or dark-brown urine  -shortness of breath or breathing problems  -stomach or side pain, or pain at the shoulder  -swelling  -tiredness  -trouble passing urine or  change in the amount of urine  Side effects that usually do not require medical attention (report to your doctor or health care professional if they continue or are bothersome):  -bone pain  -muscle pain  This list may not describe all possible side effects. Call your doctor for medical advice about side effects. You may report side effects to FDA at 1-800-FDA-1088.  Where should I keep my medicine?  Keep out of the reach of children.  If you are using this medicine at home, you will be instructed on how to store it. Throw away any unused medicine after the expiration date on the label.  NOTE: This sheet is a summary. It may not cover all possible information. If you have questions about this medicine, talk to your doctor, pharmacist, or health care provider.   2019 Elsevier/Gold Standard (2017-10-14 16:57:08)

## 2018-09-16 ENCOUNTER — Telehealth: Payer: Self-pay | Admitting: *Deleted

## 2018-09-16 NOTE — Telephone Encounter (Signed)
Attempted to call patient x 2 today, to give results of last PSA. No answering machine.

## 2018-09-16 NOTE — Telephone Encounter (Signed)
-----   Message from Scot Dock, RN sent at 09/16/2018  9:15 AM EST -----  ----- Message ----- From: Wyatt Portela, MD Sent: 09/15/2018   7:43 AM EST To: Scot Dock, RN  Please let him know his PSA is declining.

## 2018-09-16 NOTE — Telephone Encounter (Signed)
Spoke with patient's wife, gave results of last PSA. 

## 2018-09-17 DIAGNOSIS — C7951 Secondary malignant neoplasm of bone: Secondary | ICD-10-CM | POA: Diagnosis not present

## 2018-09-17 DIAGNOSIS — C61 Malignant neoplasm of prostate: Secondary | ICD-10-CM | POA: Diagnosis not present

## 2018-09-30 ENCOUNTER — Encounter: Payer: Self-pay | Admitting: Oncology

## 2018-09-30 ENCOUNTER — Telehealth: Payer: Self-pay | Admitting: *Deleted

## 2018-09-30 NOTE — Telephone Encounter (Signed)
Spoke with patient. No special precautions, just good hand washing and avoid unnecessary contact with other people

## 2018-10-03 ENCOUNTER — Inpatient Hospital Stay: Payer: Medicare Other

## 2018-10-03 ENCOUNTER — Inpatient Hospital Stay: Payer: Medicare Other | Attending: Oncology | Admitting: Oncology

## 2018-10-03 ENCOUNTER — Other Ambulatory Visit: Payer: Self-pay

## 2018-10-03 ENCOUNTER — Telehealth: Payer: Self-pay | Admitting: Oncology

## 2018-10-03 VITALS — BP 124/60 | HR 78 | Temp 97.8°F | Resp 18 | Ht 74.0 in | Wt 210.4 lb

## 2018-10-03 DIAGNOSIS — D6481 Anemia due to antineoplastic chemotherapy: Secondary | ICD-10-CM | POA: Diagnosis not present

## 2018-10-03 DIAGNOSIS — C7951 Secondary malignant neoplasm of bone: Secondary | ICD-10-CM | POA: Diagnosis not present

## 2018-10-03 DIAGNOSIS — Z5111 Encounter for antineoplastic chemotherapy: Secondary | ICD-10-CM | POA: Diagnosis not present

## 2018-10-03 DIAGNOSIS — Z95828 Presence of other vascular implants and grafts: Secondary | ICD-10-CM

## 2018-10-03 DIAGNOSIS — C61 Malignant neoplasm of prostate: Secondary | ICD-10-CM

## 2018-10-03 DIAGNOSIS — Z5189 Encounter for other specified aftercare: Secondary | ICD-10-CM | POA: Diagnosis not present

## 2018-10-03 LAB — CMP (CANCER CENTER ONLY)
ALT: 6 U/L (ref 0–44)
AST: 15 U/L (ref 15–41)
Albumin: 3.2 g/dL — ABNORMAL LOW (ref 3.5–5.0)
Alkaline Phosphatase: 136 U/L — ABNORMAL HIGH (ref 38–126)
Anion gap: 9 (ref 5–15)
BUN: 14 mg/dL (ref 8–23)
CALCIUM: 8.6 mg/dL — AB (ref 8.9–10.3)
CO2: 22 mmol/L (ref 22–32)
CREATININE: 0.83 mg/dL (ref 0.61–1.24)
Chloride: 104 mmol/L (ref 98–111)
GFR, Est AFR Am: >60 mL/min (ref >60)
GFR, Estimated: >60 mL/min (ref >60)
Glucose, Bld: 87 mg/dL (ref 70–99)
Potassium: 4.6 mmol/L (ref 3.5–5.1)
Sodium: 135 mmol/L (ref 135–145)
Total Bilirubin: 0.4 mg/dL (ref 0.3–1.2)
Total Protein: 6.3 g/dL — ABNORMAL LOW (ref 6.5–8.1)

## 2018-10-03 LAB — CBC WITH DIFFERENTIAL (CANCER CENTER ONLY)
Abs Immature Granulocytes: 0.08 10*3/uL — ABNORMAL HIGH (ref 0.00–0.07)
BASOS ABS: 0.1 10*3/uL (ref 0.0–0.1)
BASOS PCT: 1 %
Eosinophils Absolute: 0 10*3/uL (ref 0.0–0.5)
Eosinophils Relative: 0 %
HCT: 29.4 % — ABNORMAL LOW (ref 39.0–52.0)
Hemoglobin: 9.5 g/dL — ABNORMAL LOW (ref 13.0–17.0)
Immature Granulocytes: 1 %
Lymphocytes Relative: 8 %
Lymphs Abs: 0.5 10*3/uL — ABNORMAL LOW (ref 0.7–4.0)
MCH: 30.7 pg (ref 26.0–34.0)
MCHC: 32.3 g/dL (ref 30.0–36.0)
MCV: 95.1 fL (ref 80.0–100.0)
Monocytes Absolute: 0.7 10*3/uL (ref 0.1–1.0)
Monocytes Relative: 11 %
Neutro Abs: 5.3 10*3/uL (ref 1.7–7.7)
Neutrophils Relative %: 79 %
Platelet Count: 298 10*3/uL (ref 150–400)
RBC: 3.09 MIL/uL — ABNORMAL LOW (ref 4.22–5.81)
RDW: 19.3 % — ABNORMAL HIGH (ref 11.5–15.5)
WBC Count: 6.7 10*3/uL (ref 4.0–10.5)
nRBC: 0 % (ref 0.0–0.2)

## 2018-10-03 MED ORDER — SODIUM CHLORIDE 0.9 % IV SOLN
Freq: Once | INTRAVENOUS | Status: AC
  Administered 2018-10-03: 13:00:00 via INTRAVENOUS
  Filled 2018-10-03: qty 250

## 2018-10-03 MED ORDER — HEPARIN SOD (PORK) LOCK FLUSH 100 UNIT/ML IV SOLN
500.0000 [IU] | Freq: Once | INTRAVENOUS | Status: AC | PRN
  Administered 2018-10-03: 500 [IU]
  Filled 2018-10-03: qty 5

## 2018-10-03 MED ORDER — SODIUM CHLORIDE 0.9% FLUSH
10.0000 mL | Freq: Once | INTRAVENOUS | Status: AC
  Administered 2018-10-03: 10 mL
  Filled 2018-10-03: qty 10

## 2018-10-03 MED ORDER — SODIUM CHLORIDE 0.9 % IV SOLN
60.0000 mg/m2 | Freq: Once | INTRAVENOUS | Status: AC
  Administered 2018-10-03: 140 mg via INTRAVENOUS
  Filled 2018-10-03: qty 14

## 2018-10-03 MED ORDER — SODIUM CHLORIDE 0.9% FLUSH
10.0000 mL | INTRAVENOUS | Status: DC | PRN
  Administered 2018-10-03: 10 mL
  Filled 2018-10-03: qty 10

## 2018-10-03 MED ORDER — DEXAMETHASONE SODIUM PHOSPHATE 10 MG/ML IJ SOLN
INTRAMUSCULAR | Status: AC
  Filled 2018-10-03: qty 1

## 2018-10-03 MED ORDER — DEXAMETHASONE SODIUM PHOSPHATE 10 MG/ML IJ SOLN
10.0000 mg | Freq: Once | INTRAMUSCULAR | Status: AC
  Administered 2018-10-03: 10 mg via INTRAVENOUS

## 2018-10-03 NOTE — Progress Notes (Signed)
Hematology and Oncology Follow Up Visit  Edward Bailey [id number] Dec 08, 1935 83 y.o. 10/03/2018 12:05 PM Edward Bailey, MDGriffin, Edward Reichmann, MD   Principle Diagnosis: 83 year old man with advanced prostate cancer with disease to the bone diagnosed in 2017.   He was initially diagnosed in 1999 with Gleason score 3+4 = 7 and a PSA of 4.  Currently has castration-resistant disease.   Prior Therapy:  He is status post prostatectomy and found to have stage T3b disease. He received salvage radiation therapy for a rise in his PSA.  He developed biochemical relapse and was treated with androgen depravation intermittently between 2010 to about 2015.  He developed castration resistant disease in 2015.  Ketoconazole at 200 mg twice a day and prednisone 5 mg daily. Therapy discontinued on 05/24/2016 because of progression of disease. Zytiga 1000 mg daily with prednisone at 5 mg daily started in November 2017. Xtandi 160 mg daily started in April 2019 therapy discontinued due to progression of disease and July 2019. [name] started in July 2019.  He completed 6 months of therapy in December 2019. Xtandi 160 mg resumed in August 2019 after brief discontinuation in July 2019.  Therapy discontinued permanently in January 2020 for progression of disease.  Current therapy: Taxotere chemotherapy started on August 21, 2018 with 75 mg/m.  He is here for cycle 3 of therapy.    Interim History:  Edward Bailey returns today for a repeat evaluation.  Since the last visit, he received the last cycle of chemotherapy with few complaints.  He has reported some tiredness and fatigue and occasional unsteadiness.  He denies any recent falls or syncope.  He denies any fevers or chills or infusion related complications.  He denies any neuropathy.  His quality of life has declined slightly but his performance status remains about the same.  He denies any hematochezia or melena.  Denies any hospitalizations or  illnesses.  Patient denied headaches, blurry vision, syncope or seizures.  Denies any fevers, chills or sweats.  Denied chest pain, palpitation, orthopnea or leg edema.  Denied cough, wheezing or hemoptysis.  Denied nausea, vomiting or abdominal pain.  Denies any constipation or diarrhea.  Denies any frequency urgency or hesitancy.  Denies any arthralgias or myalgias.  Denies any skin rashes or lesions.  Denies any bleeding or clotting tendency.  Denies any easy bruising.  Denies any hair or nail changes.  Denies any anxiety or depression.  Remaining review of system is negative.         Medications: I have reviewed the patient's current medications.  Current Outpatient Medications  Medication Sig Dispense Refill  . amLODipine (NORVASC) 5 MG tablet TK 1 T PO QD  3  . aspirin (ASPIRIN EC) 81 MG EC tablet Take 81 mg by mouth daily. Swallow whole.    Marland Kitchen azelastine (ASTELIN) 0.1 % nasal spray Place 1-2 sprays into both nostrils daily as needed. allergies  5  . cetirizine (ZYRTEC) 10 MG tablet Take 10 mg by mouth daily as needed for allergies. As needed    . fluticasone (FLONASE) 50 MCG/ACT nasal spray Place 1 spray into both nostrils daily as needed. Allergies  5  . levothyroxine (SYNTHROID, LEVOTHROID) 112 MCG tablet TK 1 T PO QD IN THE MORNING OES  12  . levothyroxine (SYNTHROID, LEVOTHROID) 125 MCG tablet Alternates with 112 mcg  9  . lidocaine-prilocaine (EMLA) cream Apply 1 application topically as needed. 30 g 0  . Multiple Vitamins-Minerals (MULTIVITAMIN PO) Take 1 capsule by mouth  daily.    . prochlorperazine (COMPAZINE) 10 MG tablet Take 1 tablet (10 mg total) by mouth every 6 (six) hours as needed for nausea or vomiting. 30 tablet 0   No current facility-administered medications for this visit.      Allergies:  Allergies  Allergen Reactions  . Amoxicillin Rash    Has patient had a PCN reaction causing immediate rash, facial/tongue/throat swelling, SOB or lightheadedness with  hypotension: No Has patient had a PCN reaction causing severe rash involving mucus membranes or skin necrosis: No Has patient had a PCN reaction that required hospitalization No Has patient had a PCN reaction occurring within the last 10 years: Yes If all of the above answers are "NO", then may proceed with Cephalosporin use.     Past Medical History, Surgical history, Social history, and Family History were reviewed and updated.   Physical Exam:   Blood pressure 124/60, pulse 78, temperature 97.8 F (36.6 C), resp. rate 18, height 6\' 2"  (1.88 m), weight 210 lb 6.4 oz (95.4 kg), SpO2 100 %.    ECOG: 1   General appearance: Comfortable appearing without any discomfort Head: Normocephalic without any trauma Oropharynx: Mucous membranes are moist and pink without any thrush or ulcers. Eyes: Pupils are equal and round reactive to light. Lymph nodes: No cervical, supraclavicular, inguinal or axillary lymphadenopathy.   Heart:regular rate and rhythm.  S1 and S2 without leg edema. Lung: Clear without any rhonchi or wheezes.  No dullness to percussion. Abdomin: Soft, nontender, nondistended with good bowel sounds.  No hepatosplenomegaly. Musculoskeletal: No joint deformity or effusion.  Full range of motion noted. Neurological: No deficits noted on motor, sensory and deep tendon reflex exam. Skin: No petechial rash or dryness.  Appeared moist.  Psychiatric: Mood and affect appeared appropriate.        Lab Results: Lab Results  Component Value Date   WBC 6.7 10/03/2018   HGB 9.5 (L) 10/03/2018   HCT 29.4 (L) 10/03/2018   MCV 95.1 10/03/2018   PLT 298 10/03/2018     Chemistry      Component Value Date/Time   NA 136 09/12/2018 1137   NA 139 06/07/2017 1057   K 4.5 09/12/2018 1137   K 3.6 06/07/2017 1057   CL 106 09/12/2018 1137   CO2 21 (L) 09/12/2018 1137   CO2 26 06/07/2017 1057   BUN 17 09/12/2018 1137   BUN 18.5 06/07/2017 1057   CREATININE 0.93 09/12/2018 1137    CREATININE 1.2 06/07/2017 1057      Component Value Date/Time   CALCIUM 8.2 (L) 09/12/2018 1137   CALCIUM 9.0 06/07/2017 1057   ALKPHOS 181 (H) 09/12/2018 1137   ALKPHOS 112 06/07/2017 1057   AST 14 (L) 09/12/2018 1137   AST 14 06/07/2017 1057   ALT 7 09/12/2018 1137   ALT 12 06/07/2017 1057   BILITOT 0.4 09/12/2018 1137   BILITOT 0.66 06/07/2017 1057      Results for BEUFORD, GARCILAZO (MRN [id number]) as of 10/03/2018 11:47  Ref. Range 08/21/2018 11:28 09/12/2018 11:37  Prostate Specific Ag, Serum Latest Ref Range: 0.0 - 4.0 ng/mL 713.0 (H) 587.0 (H)      Impression and Plan:  83 year old man with  1.  Castration-resistant prostate cancer with disease to the bone.  He status post multiple therapies outlined above.   He continues to tolerate Taxotere chemotherapy with few complications.  His PSA showed excellent response after 1 cycle of therapy.  Risks and benefits of continuing this treatment  was reviewed today and is agreeable to continue.  I will reduce the dose to 60 mg per metered square for better tolerance given his age as well as excessive fatigue and anemia.  The plan is to complete 10 cycles of therapy if possible.      2. Androgen depravation: He continues to receive androgen deprivation therapy which I recommended continuing indefinitely.  3.  Bone directed therapy: Is currently on Xgeva without any issues or complaints.  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   4.  Anemia: Related to chemotherapy without any active bleeding.  Risks and benefits of a transfusion were reviewed today and this will be deferred unless his hemoglobin is below 8.  5.  IV access: Port-A-Cath remains in use without any issues.  6.  Antiemetics: No nausea or vomiting reported at this time Compazine is available to him.  7.  Neutropenia prophylaxis: He will continues to receive Neulasta given his high risk of developing  neutropenic infection.  8.  Prognosis and goals of care: His disease is incurable and treatment remains palliative.  His performance status is adequate and aggressive therapy remains warranted.  9. Followup: 3 weeks for the next cycle of chemotherapy.   25 minutes was spent with the patient face-to-face today.  More than 50% of time was dedicated to reviewing his disease status, treatment options, dealing with complications related to chemotherapy and answering questions regarding future plan of care.  Zola Button, MD 3/13/202012:05 PM

## 2018-10-03 NOTE — Patient Instructions (Signed)
[facility] Discharge Instructions for Patients Receiving Chemotherapy  Today you received the following chemotherapy agents: Taxotere  To help prevent nausea and vomiting after your treatment, we encourage you to take your nausea medication as directed.    If you develop nausea and vomiting that is not controlled by your nausea medication, call the clinic.   BELOW ARE SYMPTOMS THAT SHOULD BE REPORTED IMMEDIATELY:  *FEVER GREATER THAN 100.5 F  *CHILLS WITH OR WITHOUT FEVER  NAUSEA AND VOMITING THAT IS NOT CONTROLLED WITH YOUR NAUSEA MEDICATION  *UNUSUAL SHORTNESS OF BREATH  *UNUSUAL BRUISING OR BLEEDING  TENDERNESS IN MOUTH AND THROAT WITH OR WITHOUT PRESENCE OF ULCERS  *URINARY PROBLEMS  *BOWEL PROBLEMS  UNUSUAL RASH Items with * indicate a potential emergency and should be followed up as soon as possible.  Feel free to call the clinic should you have any questions or concerns. The clinic phone number is [phone].  Please show the CHEMO ALERT CARD at check-in to the Emergency Department and triage nurse.   

## 2018-10-03 NOTE — Telephone Encounter (Signed)
Gave avs and calendar ° °

## 2018-10-04 ENCOUNTER — Inpatient Hospital Stay: Payer: Medicare Other

## 2018-10-04 VITALS — BP 123/61 | HR 75 | Temp 97.7°F | Resp 18

## 2018-10-04 DIAGNOSIS — C61 Malignant neoplasm of prostate: Secondary | ICD-10-CM

## 2018-10-04 DIAGNOSIS — C7951 Secondary malignant neoplasm of bone: Secondary | ICD-10-CM

## 2018-10-04 DIAGNOSIS — D6481 Anemia due to antineoplastic chemotherapy: Secondary | ICD-10-CM | POA: Diagnosis not present

## 2018-10-04 DIAGNOSIS — Z5189 Encounter for other specified aftercare: Secondary | ICD-10-CM | POA: Diagnosis not present

## 2018-10-04 DIAGNOSIS — Z5111 Encounter for antineoplastic chemotherapy: Secondary | ICD-10-CM | POA: Diagnosis not present

## 2018-10-04 LAB — PROSTATE-SPECIFIC AG, SERUM (LABCORP): PROSTATE SPECIFIC AG, SERUM: 557 ng/mL — AB (ref 0.0–4.0)

## 2018-10-04 MED ORDER — PEGFILGRASTIM-CBQV 6 MG/0.6ML ~~LOC~~ SOSY
6.0000 mg | PREFILLED_SYRINGE | Freq: Once | SUBCUTANEOUS | Status: AC
  Administered 2018-10-04: 6 mg via SUBCUTANEOUS

## 2018-10-04 MED ORDER — PEGFILGRASTIM-CBQV 6 MG/0.6ML ~~LOC~~ SOSY
PREFILLED_SYRINGE | SUBCUTANEOUS | Status: AC
  Filled 2018-10-04: qty 0.6

## 2018-10-04 NOTE — Patient Instructions (Signed)
Pegfilgrastim injection  What is this medicine?  PEGFILGRASTIM (PEG fil gra stim) is a long-acting granulocyte colony-stimulating factor that stimulates the growth of neutrophils, a type of white blood cell important in the body's fight against infection. It is used to reduce the incidence of fever and infection in patients with certain types of cancer who are receiving chemotherapy that affects the bone marrow, and to increase survival after being exposed to high doses of radiation.  This medicine may be used for other purposes; ask your health care provider or pharmacist if you have questions.  COMMON BRAND NAME(S): Fulphila, Neulasta, UDENYCA  What should I tell my health care provider before I take this medicine?  They need to know if you have any of these conditions:  -kidney disease  -latex allergy  -ongoing radiation therapy  -sickle cell disease  -skin reactions to acrylic adhesives (On-Body Injector only)  -an unusual or allergic reaction to pegfilgrastim, filgrastim, other medicines, foods, dyes, or preservatives  -pregnant or trying to get pregnant  -breast-feeding  How should I use this medicine?  This medicine is for injection under the skin. If you get this medicine at home, you will be taught how to prepare and give the pre-filled syringe or how to use the On-body Injector. Refer to the patient Instructions for Use for detailed instructions. Use exactly as directed. Tell your healthcare provider immediately if you suspect that the On-body Injector may not have performed as intended or if you suspect the use of the On-body Injector resulted in a missed or partial dose.  It is important that you put your used needles and syringes in a special sharps container. Do not put them in a trash can. If you do not have a sharps container, call your pharmacist or healthcare provider to get one.  Talk to your pediatrician regarding the use of this medicine in children. While this drug may be prescribed for  selected conditions, precautions do apply.  Overdosage: If you think you have taken too much of this medicine contact a poison control center or emergency room at once.  NOTE: This medicine is only for you. Do not share this medicine with others.  What if I miss a dose?  It is important not to miss your dose. Call your doctor or health care professional if you miss your dose. If you miss a dose due to an On-body Injector failure or leakage, a new dose should be administered as soon as possible using a single prefilled syringe for manual use.  What may interact with this medicine?  Interactions have not been studied.  Give your health care provider a list of all the medicines, herbs, non-prescription drugs, or dietary supplements you use. Also tell them if you smoke, drink alcohol, or use illegal drugs. Some items may interact with your medicine.  This list may not describe all possible interactions. Give your health care provider a list of all the medicines, herbs, non-prescription drugs, or dietary supplements you use. Also tell them if you smoke, drink alcohol, or use illegal drugs. Some items may interact with your medicine.  What should I watch for while using this medicine?  You may need blood work done while you are taking this medicine.  If you are going to need a MRI, CT scan, or other procedure, tell your doctor that you are using this medicine (On-Body Injector only).  What side effects may I notice from receiving this medicine?  Side effects that you should report to   your doctor or health care professional as soon as possible:  -allergic reactions like skin rash, itching or hives, swelling of the face, lips, or tongue  -back pain  -dizziness  -fever  -pain, redness, or irritation at site where injected  -pinpoint red spots on the skin  -red or dark-brown urine  -shortness of breath or breathing problems  -stomach or side pain, or pain at the shoulder  -swelling  -tiredness  -trouble passing urine or  change in the amount of urine  Side effects that usually do not require medical attention (report to your doctor or health care professional if they continue or are bothersome):  -bone pain  -muscle pain  This list may not describe all possible side effects. Call your doctor for medical advice about side effects. You may report side effects to FDA at 1-800-FDA-1088.  Where should I keep my medicine?  Keep out of the reach of children.  If you are using this medicine at home, you will be instructed on how to store it. Throw away any unused medicine after the expiration date on the label.  NOTE: This sheet is a summary. It may not cover all possible information. If you have questions about this medicine, talk to your doctor, pharmacist, or health care provider.   2019 Elsevier/Gold Standard (2017-10-14 16:57:08)

## 2018-10-06 ENCOUNTER — Telehealth: Payer: Self-pay | Admitting: *Deleted

## 2018-10-06 ENCOUNTER — Ambulatory Visit: Payer: Medicare Other

## 2018-10-06 NOTE — Telephone Encounter (Signed)
Notified of message below

## 2018-10-06 NOTE — Telephone Encounter (Signed)
-----   Message from Wyatt Portela, MD sent at 10/06/2018  8:55 AM EDT ----- Please let him know his PSA is declining.

## 2018-10-21 DIAGNOSIS — I10 Essential (primary) hypertension: Secondary | ICD-10-CM | POA: Diagnosis not present

## 2018-10-21 DIAGNOSIS — C61 Malignant neoplasm of prostate: Secondary | ICD-10-CM | POA: Diagnosis not present

## 2018-10-21 DIAGNOSIS — G4733 Obstructive sleep apnea (adult) (pediatric): Secondary | ICD-10-CM | POA: Diagnosis not present

## 2018-10-21 DIAGNOSIS — E039 Hypothyroidism, unspecified: Secondary | ICD-10-CM | POA: Diagnosis not present

## 2018-10-22 ENCOUNTER — Encounter: Payer: Self-pay | Admitting: Oncology

## 2018-10-24 ENCOUNTER — Other Ambulatory Visit: Payer: Self-pay

## 2018-10-24 ENCOUNTER — Inpatient Hospital Stay: Payer: Medicare Other

## 2018-10-24 ENCOUNTER — Inpatient Hospital Stay: Payer: Medicare Other | Attending: Oncology | Admitting: Oncology

## 2018-10-24 VITALS — BP 124/57 | HR 89 | Temp 97.6°F | Resp 18 | Ht 74.0 in | Wt 214.8 lb

## 2018-10-24 DIAGNOSIS — Z5111 Encounter for antineoplastic chemotherapy: Secondary | ICD-10-CM | POA: Diagnosis not present

## 2018-10-24 DIAGNOSIS — D630 Anemia in neoplastic disease: Secondary | ICD-10-CM | POA: Diagnosis not present

## 2018-10-24 DIAGNOSIS — C61 Malignant neoplasm of prostate: Secondary | ICD-10-CM

## 2018-10-24 DIAGNOSIS — D6481 Anemia due to antineoplastic chemotherapy: Secondary | ICD-10-CM | POA: Insufficient documentation

## 2018-10-24 DIAGNOSIS — D649 Anemia, unspecified: Secondary | ICD-10-CM | POA: Diagnosis not present

## 2018-10-24 DIAGNOSIS — C7951 Secondary malignant neoplasm of bone: Secondary | ICD-10-CM

## 2018-10-24 DIAGNOSIS — Z5189 Encounter for other specified aftercare: Secondary | ICD-10-CM | POA: Diagnosis not present

## 2018-10-24 DIAGNOSIS — Z95828 Presence of other vascular implants and grafts: Secondary | ICD-10-CM

## 2018-10-24 LAB — CMP (CANCER CENTER ONLY)
ALT: 8 U/L (ref 0–44)
AST: 15 U/L (ref 15–41)
Albumin: 3.3 g/dL — ABNORMAL LOW (ref 3.5–5.0)
Alkaline Phosphatase: 113 U/L (ref 38–126)
Anion gap: 8 (ref 5–15)
BUN: 19 mg/dL (ref 8–23)
CO2: 23 mmol/L (ref 22–32)
Calcium: 8.4 mg/dL — ABNORMAL LOW (ref 8.9–10.3)
Chloride: 103 mmol/L (ref 98–111)
Creatinine: 0.84 mg/dL (ref 0.61–1.24)
GFR, Est AFR Am: >60 mL/min (ref >60)
GFR, Estimated: >60 mL/min (ref >60)
Glucose, Bld: 150 mg/dL — ABNORMAL HIGH (ref 70–99)
Potassium: 4.2 mmol/L (ref 3.5–5.1)
Sodium: 134 mmol/L — ABNORMAL LOW (ref 135–145)
Total Bilirubin: 0.5 mg/dL (ref 0.3–1.2)
Total Protein: 6.1 g/dL — ABNORMAL LOW (ref 6.5–8.1)

## 2018-10-24 LAB — CBC WITH DIFFERENTIAL (CANCER CENTER ONLY)
Abs Immature Granulocytes: 0.05 10*3/uL (ref 0.00–0.07)
Basophils Absolute: 0.1 10*3/uL (ref 0.0–0.1)
Basophils Relative: 1 %
Eosinophils Absolute: 0.1 10*3/uL (ref 0.0–0.5)
Eosinophils Relative: 1 %
HCT: 29.3 % — ABNORMAL LOW (ref 39.0–52.0)
Hemoglobin: 9.5 g/dL — ABNORMAL LOW (ref 13.0–17.0)
Immature Granulocytes: 1 %
Lymphocytes Relative: 10 %
Lymphs Abs: 0.5 10*3/uL — ABNORMAL LOW (ref 0.7–4.0)
MCH: 31.4 pg (ref 26.0–34.0)
MCHC: 32.4 g/dL (ref 30.0–36.0)
MCV: 96.7 fL (ref 80.0–100.0)
Monocytes Absolute: 0.5 10*3/uL (ref 0.1–1.0)
Monocytes Relative: 10 %
Neutro Abs: 4.1 10*3/uL (ref 1.7–7.7)
Neutrophils Relative %: 77 %
Platelet Count: 231 10*3/uL (ref 150–400)
RBC: 3.03 MIL/uL — ABNORMAL LOW (ref 4.22–5.81)
RDW: 20.8 % — ABNORMAL HIGH (ref 11.5–15.5)
WBC Count: 5.3 10*3/uL (ref 4.0–10.5)
nRBC: 0 % (ref 0.0–0.2)

## 2018-10-24 MED ORDER — HEPARIN SOD (PORK) LOCK FLUSH 100 UNIT/ML IV SOLN
500.0000 [IU] | Freq: Once | INTRAVENOUS | Status: AC | PRN
  Administered 2018-10-24: 16:00:00 500 [IU]
  Filled 2018-10-24: qty 5

## 2018-10-24 MED ORDER — SODIUM CHLORIDE 0.9% FLUSH
10.0000 mL | INTRAVENOUS | Status: DC | PRN
  Administered 2018-10-24: 10 mL
  Filled 2018-10-24: qty 10

## 2018-10-24 MED ORDER — SODIUM CHLORIDE 0.9 % IV SOLN
Freq: Once | INTRAVENOUS | Status: AC
  Administered 2018-10-24: 14:00:00 via INTRAVENOUS
  Filled 2018-10-24: qty 250

## 2018-10-24 MED ORDER — DEXAMETHASONE SODIUM PHOSPHATE 10 MG/ML IJ SOLN
INTRAMUSCULAR | Status: AC
  Filled 2018-10-24: qty 1

## 2018-10-24 MED ORDER — DEXAMETHASONE SODIUM PHOSPHATE 10 MG/ML IJ SOLN
10.0000 mg | Freq: Once | INTRAMUSCULAR | Status: AC
  Administered 2018-10-24: 10 mg via INTRAVENOUS

## 2018-10-24 MED ORDER — SODIUM CHLORIDE 0.9 % IV SOLN
60.0000 mg/m2 | Freq: Once | INTRAVENOUS | Status: AC
  Administered 2018-10-24: 15:00:00 140 mg via INTRAVENOUS
  Filled 2018-10-24: qty 14

## 2018-10-24 MED ORDER — SODIUM CHLORIDE 0.9% FLUSH
10.0000 mL | Freq: Once | INTRAVENOUS | Status: AC
  Administered 2018-10-24: 10 mL
  Filled 2018-10-24: qty 10

## 2018-10-24 NOTE — Progress Notes (Signed)
Hematology and Oncology Follow Up Visit  Edward Bailey [id number] June 25, 1936 83 y.o. 10/24/2018 1:41 PM [name], MDGriffin, [name], MD   Principle Diagnosis: 83 year old man with prostate cancer that is currently castration-resistant with disease to the bone diagnosed in 2017.  He was initially diagnosed in 1999 with Gleason score 3+4 = 7 and a PSA of 4.     Prior Therapy:  He is status post prostatectomy and found to have stage T3b disease. He received salvage radiation therapy for a rise in his PSA.  He developed biochemical relapse and was treated with androgen depravation intermittently between 2010 to about 2015.  He developed castration resistant disease in 2015.  Ketoconazole at 200 mg twice a day and prednisone 5 mg daily. Therapy discontinued on 05/24/2016 because of progression of disease. Zytiga 1000 mg daily with prednisone at 5 mg daily started in November 2017. Xtandi 160 mg daily started in April 2019 therapy discontinued due to progression of disease and July 2019. [name] started in July 2019.  He completed 6 months of therapy in December 2019. Xtandi 160 mg resumed in August 2019 after brief discontinuation in July 2019.  Therapy discontinued permanently in January 2020 for progression of disease.  Current therapy: Taxotere chemotherapy started on August 21, 2018 with 75 mg/m.  He is here for cycle 4 of therapy.    Interim History:  Edward Bailey is here for a follow-up visit.  Since the last visit, he received the last cycle of chemotherapy without any major complications.  He denies nausea, fatigue or infusion related complications.  He denies any peripheral neuropathy.  His performance status and quality of life has remained unchanged.  He does report some fatigue and tiredness but no dyspnea on exertion or cough.  He denies any wheezing or recent fevers.  He denied any alteration mental status, neuropathy, confusion or dizziness.  Denies any headaches or lethargy.   Denies any night sweats, weight loss or changes in appetite.  Denied orthopnea, dyspnea on exertion or chest discomfort.  Denies shortness of breath, difficulty breathing hemoptysis or cough.  Denies any abdominal distention, nausea, early satiety or dyspepsia.  Denies any hematuria, frequency, dysuria or nocturia.  Denies any skin irritation, dryness or rash.  Denies any ecchymosis or petechiae.  Denies any lymphadenopathy or clotting.  Denies any heat or cold intolerance.  Denies any anxiety or depression.  Remaining review of system is negative.            Medications: I have reviewed the patient's current medications.  Current Outpatient Medications  Medication Sig Dispense Refill  . amLODipine (NORVASC) 5 MG tablet TK 1 T PO QD  3  . aspirin (ASPIRIN EC) 81 MG EC tablet Take 81 mg by mouth daily. Swallow whole.    Marland Kitchen azelastine (ASTELIN) 0.1 % nasal spray Place 1-2 sprays into both nostrils daily as needed. allergies  5  . cetirizine (ZYRTEC) 10 MG tablet Take 10 mg by mouth daily as needed for allergies. As needed    . fluticasone (FLONASE) 50 MCG/ACT nasal spray Place 1 spray into both nostrils daily as needed. Allergies  5  . levothyroxine (SYNTHROID, LEVOTHROID) 112 MCG tablet TK 1 T PO QD IN THE MORNING OES  12  . levothyroxine (SYNTHROID, LEVOTHROID) 125 MCG tablet Alternates with 112 mcg  9  . lidocaine-prilocaine (EMLA) cream Apply 1 application topically as needed. 30 g 0  . Multiple Vitamins-Minerals (MULTIVITAMIN PO) Take 1 capsule by mouth daily.    Marland Kitchen  prochlorperazine (COMPAZINE) 10 MG tablet Take 1 tablet (10 mg total) by mouth every 6 (six) hours as needed for nausea or vomiting. 30 tablet 0   No current facility-administered medications for this visit.      Allergies:  Allergies  Allergen Reactions  . Amoxicillin Rash    Has patient had a PCN reaction causing immediate rash, facial/tongue/throat swelling, SOB or lightheadedness with hypotension: No Has patient  had a PCN reaction causing severe rash involving mucus membranes or skin necrosis: No Has patient had a PCN reaction that required hospitalization No Has patient had a PCN reaction occurring within the last 10 years: Yes If all of the above answers are "NO", then may proceed with Cephalosporin use.     Past Medical History, Surgical history, Social history, and Family History were reviewed and updated.   Physical Exam:   Blood pressure (!) 124/57, pulse 89, temperature 97.6 F (36.4 C), temperature source Oral, resp. rate 18, height 6\' 2"  (1.88 m), weight 214 lb 12.8 oz (97.4 kg), SpO2 100 %.    ECOG: 1   General appearance: Alert, awake without any distress. Head: Atraumatic without abnormalities Oropharynx: Without any thrush or ulcers. Eyes: No scleral icterus. Lymph nodes: No lymphadenopathy noted in the cervical, supraclavicular, or axillary nodes Heart:regular rate and rhythm, without any murmurs or gallops.   Lung: Clear to auscultation without any rhonchi, wheezes or dullness to percussion. Abdomin: Soft, nontender without any shifting dullness or ascites. Musculoskeletal: No clubbing or cyanosis. Neurological: No motor or sensory deficits. Skin: No rashes or lesions.         Lab Results: Lab Results  Component Value Date   WBC 6.7 10/03/2018   HGB 9.5 (L) 10/03/2018   HCT 29.4 (L) 10/03/2018   MCV 95.1 10/03/2018   PLT 298 10/03/2018     Chemistry      Component Value Date/Time   NA 135 10/03/2018 1120   NA 139 06/07/2017 1057   K 4.6 10/03/2018 1120   K 3.6 06/07/2017 1057   CL 104 10/03/2018 1120   CO2 22 10/03/2018 1120   CO2 26 06/07/2017 1057   BUN 14 10/03/2018 1120   BUN 18.5 06/07/2017 1057   CREATININE 0.83 10/03/2018 1120   CREATININE 1.2 06/07/2017 1057      Component Value Date/Time   CALCIUM 8.6 (L) 10/03/2018 1120   CALCIUM 9.0 06/07/2017 1057   ALKPHOS 136 (H) 10/03/2018 1120   ALKPHOS 112 06/07/2017 1057   AST 15 10/03/2018  1120   AST 14 06/07/2017 1057   ALT 6 10/03/2018 1120   ALT 12 06/07/2017 1057   BILITOT 0.4 10/03/2018 1120   BILITOT 0.66 06/07/2017 1057       Results for Edward, Bailey (MRN [id number]) as of 10/24/2018 13:42  Ref. Range 09/12/2018 11:37 10/03/2018 11:20  Prostate Specific Ag, Serum Latest Ref Range: 0.0 - 4.0 ng/mL 587.0 (H) 557.0 (H)      Impression and Plan:  83 year old man with  1.  Advanced prostate cancer that is currently castration-resistant with disease to the bone.   He is currently on Taxotere chemotherapy with reasonable response in his PSA.  His PSA has declined to 557 from 713.  Risks and benefits of continuing chemotherapy versus treatment interruption was discussed today.  He is concerned about his immunity being compromised with increased risk of infection at this time.  He also understands with holding chemotherapy can also affect his cancer progression which can lead to potential complications.  After discussion today, he is agreeable to continue with chemotherapy understanding his risk of infection.  We have discussed measures to alleviate his risk of exposure including avoiding any contacts unless absolutely necessary.      2. Androgen depravation: He is on Lupron indefinitely which is to be continued at this time.  3.  Bone directed therapy: I recommended continuing Xgeva indefinitely.  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   4.  Anemia: Hemoglobin remained stable at this time does not require any transfusion.  5.  IV access: Port-A-Cath has been in use without any issues or complications.  6.  Antiemetics: Compazine is available to him to use as needed.  No issues with nausea or vomiting.  7.  Neutropenia prophylaxis: He will receive growth factor support after each cycle of therapy.  No issues with infection at this time.  8.  Prognosis and goals of care: Therapy remains palliative at this  time although aggressive therapy is warranted given his reasonable performance status.  9. Followup: We will be in 3 weeks for the next cycle of chemotherapy.   25 minutes was spent with the patient face-to-face today.  More than 50% of time was spent on reviewing his disease status, treatment options, complications related to chemotherapy and answering questions regarding future plan of care.  Zola Button, MD 4/3/20201:41 PM

## 2018-10-24 NOTE — Patient Instructions (Addendum)
[facility] Discharge Instructions for Patients Receiving Chemotherapy  Today you received the following chemotherapy agents: Docetaxel (TAXOTERE).  To help prevent nausea and vomiting after your treatment, we encourage you to take your nausea medication as directed.   If you develop nausea and vomiting that is not controlled by your nausea medication, call the clinic.   BELOW ARE SYMPTOMS THAT SHOULD BE REPORTED IMMEDIATELY:  *FEVER GREATER THAN 100.5 F  *CHILLS WITH OR WITHOUT FEVER  NAUSEA AND VOMITING THAT IS NOT CONTROLLED WITH YOUR NAUSEA MEDICATION  *UNUSUAL SHORTNESS OF BREATH  *UNUSUAL BRUISING OR BLEEDING  TENDERNESS IN MOUTH AND THROAT WITH OR WITHOUT PRESENCE OF ULCERS  *URINARY PROBLEMS  *BOWEL PROBLEMS  UNUSUAL RASH Items with * indicate a potential emergency and should be followed up as soon as possible.  Feel free to call the clinic should you have any questions or concerns. The clinic phone number is [phone].  Please show the CHEMO ALERT CARD at check-in to the Emergency Department and triage nurse.  Coronavirus (COVID-19) Are you at risk?  Are you at risk for the Coronavirus (COVID-19)?  To be considered HIGH RISK for Coronavirus (COVID-19), you have to meet the following criteria:  . Traveled to [place], [place], [place], [place] or [place]; or in the [place] to [place], [place], [place], or [place]; and have fever, cough, and shortness of breath within the last 2 weeks of travel OR . Been in close contact with a person diagnosed with COVID-19 within the last 2 weeks and have fever, cough, and shortness of breath . IF YOU DO NOT MEET THESE CRITERIA, YOU ARE CONSIDERED LOW RISK FOR COVID-19.  What to do if you are HIGH RISK for COVID-19?  . If you are having a medical emergency, call 911. . Seek medical care right away. Before you go to a doctor's office, urgent care or emergency department, call ahead and tell them  about your recent travel, contact with someone diagnosed with COVID-19, and your symptoms. You should receive instructions from your physician's office regarding next steps of care.  . When you arrive at healthcare provider, tell the healthcare staff immediately you have returned from visiting [place], [place], [place], [place] or [place]; or traveled in the [place] to [place], [place], [place], or [place]; in the last two weeks or you have been in close contact with a person diagnosed with COVID-19 in the last 2 weeks.   . Tell the health care staff about your symptoms: fever, cough and shortness of breath. . After you have been seen by a medical provider, you will be either: o Tested for (COVID-19) and discharged home on quarantine except to seek medical care if symptoms worsen, and asked to  - Stay home and avoid contact with others until you get your results (4-5 days)  - Avoid travel on public transportation if possible (such as bus, train, or airplane) or o Sent to the Emergency Department by EMS for evaluation, COVID-19 testing, and possible admission depending on your condition and test results.  What to do if you are LOW RISK for COVID-19?  Reduce your risk of any infection by using the same precautions used for avoiding the common cold or flu:  . Wash your hands often with soap and warm water for at least 20 seconds.  If soap and water are not readily available, use an alcohol-based hand sanitizer with at least 60% alcohol.  . If coughing or   sneezing, cover your mouth and nose by coughing or sneezing into the elbow areas of your shirt or coat, into a tissue or into your sleeve (not your hands). . Avoid shaking hands with others and consider head nods or verbal greetings only. . Avoid touching your eyes, nose, or mouth with unwashed hands.  . Avoid close contact with people who are sick. . Avoid places or events with large numbers of people in one location, like concerts or  sporting events. . Carefully consider travel plans you have or are making. . If you are planning any travel outside or inside the [place], visit the CDC's Travelers' Health webpage for the latest health notices. . If you have some symptoms but not all symptoms, continue to monitor at home and seek medical attention if your symptoms worsen. . If you are having a medical emergency, call 911.   ADDITIONAL HEALTHCARE OPTIONS FOR PATIENTS  [facility] / e-Visit: [email or web address]         [facility]: [phone]  [facility]: [phone]                   [facility]: [phone]    

## 2018-10-25 LAB — PROSTATE-SPECIFIC AG, SERUM (LABCORP): Prostate Specific Ag, Serum: 504 ng/mL — ABNORMAL HIGH (ref 0.0–4.0)

## 2018-10-27 ENCOUNTER — Other Ambulatory Visit: Payer: Self-pay

## 2018-10-27 ENCOUNTER — Telehealth: Payer: Self-pay | Admitting: Oncology

## 2018-10-27 ENCOUNTER — Inpatient Hospital Stay: Payer: Medicare Other

## 2018-10-27 ENCOUNTER — Telehealth: Payer: Self-pay

## 2018-10-27 DIAGNOSIS — Z5189 Encounter for other specified aftercare: Secondary | ICD-10-CM | POA: Diagnosis not present

## 2018-10-27 DIAGNOSIS — Z192 Hormone resistant malignancy status: Secondary | ICD-10-CM | POA: Diagnosis not present

## 2018-10-27 DIAGNOSIS — R9721 Rising PSA following treatment for malignant neoplasm of prostate: Secondary | ICD-10-CM | POA: Diagnosis not present

## 2018-10-27 DIAGNOSIS — C7951 Secondary malignant neoplasm of bone: Secondary | ICD-10-CM

## 2018-10-27 DIAGNOSIS — D649 Anemia, unspecified: Secondary | ICD-10-CM | POA: Diagnosis not present

## 2018-10-27 DIAGNOSIS — D6481 Anemia due to antineoplastic chemotherapy: Secondary | ICD-10-CM | POA: Diagnosis not present

## 2018-10-27 DIAGNOSIS — Z5111 Encounter for antineoplastic chemotherapy: Secondary | ICD-10-CM | POA: Diagnosis not present

## 2018-10-27 DIAGNOSIS — C61 Malignant neoplasm of prostate: Secondary | ICD-10-CM | POA: Diagnosis not present

## 2018-10-27 DIAGNOSIS — D630 Anemia in neoplastic disease: Secondary | ICD-10-CM | POA: Diagnosis not present

## 2018-10-27 MED ORDER — PEGFILGRASTIM-CBQV 6 MG/0.6ML ~~LOC~~ SOSY
6.0000 mg | PREFILLED_SYRINGE | Freq: Once | SUBCUTANEOUS | Status: AC
  Administered 2018-10-27: 6 mg via SUBCUTANEOUS

## 2018-10-27 MED ORDER — PEGFILGRASTIM-CBQV 6 MG/0.6ML ~~LOC~~ SOSY
PREFILLED_SYRINGE | SUBCUTANEOUS | Status: AC
  Filled 2018-10-27: qty 0.6

## 2018-10-27 NOTE — Telephone Encounter (Signed)
-----   Message from Wyatt Portela, MD sent at 10/27/2018 10:00 AM EDT ----- Please let him know his PSA is down.

## 2018-10-27 NOTE — Telephone Encounter (Signed)
Contacted patient and made aware of PSA results. 

## 2018-10-27 NOTE — Telephone Encounter (Signed)
Scheduled appt per 4/3 sch message. °

## 2018-10-27 NOTE — Patient Instructions (Signed)
Pegfilgrastim injection  What is this medicine?  PEGFILGRASTIM (PEG fil gra stim) is a long-acting granulocyte colony-stimulating factor that stimulates the growth of neutrophils, a type of white blood cell important in the body's fight against infection. It is used to reduce the incidence of fever and infection in patients with certain types of cancer who are receiving chemotherapy that affects the bone marrow, and to increase survival after being exposed to high doses of radiation.  This medicine may be used for other purposes; ask your health care provider or pharmacist if you have questions.  COMMON BRAND NAME(S): Fulphila, Neulasta, UDENYCA  What should I tell my health care provider before I take this medicine?  They need to know if you have any of these conditions:  -kidney disease  -latex allergy  -ongoing radiation therapy  -sickle cell disease  -skin reactions to acrylic adhesives (On-Body Injector only)  -an unusual or allergic reaction to pegfilgrastim, filgrastim, other medicines, foods, dyes, or preservatives  -pregnant or trying to get pregnant  -breast-feeding  How should I use this medicine?  This medicine is for injection under the skin. If you get this medicine at home, you will be taught how to prepare and give the pre-filled syringe or how to use the On-body Injector. Refer to the patient Instructions for Use for detailed instructions. Use exactly as directed. Tell your healthcare provider immediately if you suspect that the On-body Injector may not have performed as intended or if you suspect the use of the On-body Injector resulted in a missed or partial dose.  It is important that you put your used needles and syringes in a special sharps container. Do not put them in a trash can. If you do not have a sharps container, call your pharmacist or healthcare provider to get one.  Talk to your pediatrician regarding the use of this medicine in children. While this drug may be prescribed for  selected conditions, precautions do apply.  Overdosage: If you think you have taken too much of this medicine contact a poison control center or emergency room at once.  NOTE: This medicine is only for you. Do not share this medicine with others.  What if I miss a dose?  It is important not to miss your dose. Call your doctor or health care professional if you miss your dose. If you miss a dose due to an On-body Injector failure or leakage, a new dose should be administered as soon as possible using a single prefilled syringe for manual use.  What may interact with this medicine?  Interactions have not been studied.  Give your health care provider a list of all the medicines, herbs, non-prescription drugs, or dietary supplements you use. Also tell them if you smoke, drink alcohol, or use illegal drugs. Some items may interact with your medicine.  This list may not describe all possible interactions. Give your health care provider a list of all the medicines, herbs, non-prescription drugs, or dietary supplements you use. Also tell them if you smoke, drink alcohol, or use illegal drugs. Some items may interact with your medicine.  What should I watch for while using this medicine?  You may need blood work done while you are taking this medicine.  If you are going to need a MRI, CT scan, or other procedure, tell your doctor that you are using this medicine (On-Body Injector only).  What side effects may I notice from receiving this medicine?  Side effects that you should report to   your doctor or health care professional as soon as possible:  -allergic reactions like skin rash, itching or hives, swelling of the face, lips, or tongue  -back pain  -dizziness  -fever  -pain, redness, or irritation at site where injected  -pinpoint red spots on the skin  -red or dark-brown urine  -shortness of breath or breathing problems  -stomach or side pain, or pain at the shoulder  -swelling  -tiredness  -trouble passing urine or  change in the amount of urine  Side effects that usually do not require medical attention (report to your doctor or health care professional if they continue or are bothersome):  -bone pain  -muscle pain  This list may not describe all possible side effects. Call your doctor for medical advice about side effects. You may report side effects to FDA at 1-800-FDA-1088.  Where should I keep my medicine?  Keep out of the reach of children.  If you are using this medicine at home, you will be instructed on how to store it. Throw away any unused medicine after the expiration date on the label.  NOTE: This sheet is a summary. It may not cover all possible information. If you have questions about this medicine, talk to your doctor, pharmacist, or health care provider.   2019 Elsevier/Gold Standard (2017-10-14 16:57:08)

## 2018-11-06 ENCOUNTER — Telehealth: Payer: Self-pay

## 2018-11-06 NOTE — Telephone Encounter (Signed)
Neulasta Onpro Patient Outreach Note  Patient was contacted today in regards to switching G-CSF therapy to Neulasta Onpro ([email or web address]). Patient was educated on the purpose of this proposed change in therapy due to COVID-19 pandemic. Patient was educated about Neulasta Onpro on-body injector and patient will be provided with an educational video while in infusion on their next scheduled date if changed to Neulasta Onpro.   [x]  Patient agrees to change in therapy. Begin process to change to Neulasta Onpro therapy.  []  Patient does not agree to change in therapy. No change to Neulasta Onpro at this time.    Thank Rosalyn Charters  11/06/2018 11:47 AM

## 2018-11-10 DIAGNOSIS — D1801 Hemangioma of skin and subcutaneous tissue: Secondary | ICD-10-CM | POA: Diagnosis not present

## 2018-11-10 DIAGNOSIS — L570 Actinic keratosis: Secondary | ICD-10-CM | POA: Diagnosis not present

## 2018-11-10 DIAGNOSIS — L821 Other seborrheic keratosis: Secondary | ICD-10-CM | POA: Diagnosis not present

## 2018-11-10 DIAGNOSIS — D225 Melanocytic nevi of trunk: Secondary | ICD-10-CM | POA: Diagnosis not present

## 2018-11-12 ENCOUNTER — Ambulatory Visit (INDEPENDENT_AMBULATORY_CARE_PROVIDER_SITE_OTHER): Payer: Medicare Other | Admitting: Podiatry

## 2018-11-12 ENCOUNTER — Encounter: Payer: Self-pay | Admitting: Podiatry

## 2018-11-12 ENCOUNTER — Other Ambulatory Visit: Payer: Self-pay

## 2018-11-12 VITALS — Temp 96.8°F

## 2018-11-12 DIAGNOSIS — M79675 Pain in left toe(s): Secondary | ICD-10-CM

## 2018-11-12 DIAGNOSIS — B351 Tinea unguium: Secondary | ICD-10-CM | POA: Diagnosis not present

## 2018-11-12 DIAGNOSIS — M79674 Pain in right toe(s): Secondary | ICD-10-CM

## 2018-11-12 NOTE — Progress Notes (Signed)
Complaint:  Visit Type: Patient presents  to my office for  preventative foot care services. Complaint: Patient states" my nails have grown long and thick and become painful to walk and wear shoes" Patient is under going chemo... The patient presents for preventative foot care services.   Podiatric Exam: Vascular: dorsalis pedis and posterior tibial pulses are palpable bilateral. Capillary return is immediate. Temperature gradient is WNL. Skin turgor WNL  Sensorium: Normal Semmes Weinstein monofilament test. Normal tactile sensation bilaterally. Nail Exam: Pt has thick disfigured discolored nails with subungual debris noted bilateral entire nail hallux through fifth toenails Ulcer Exam: There is no evidence of ulcer or pre-ulcerative changes or infection. Orthopedic Exam: Muscle tone and strength are WNL. No limitations in general ROM. No crepitus or effusions noted. Foot type and digits show no abnormalities. Bony prominences are unremarkable. Skin: No Porokeratosis. No infection or ulcers  Diagnosis:  Onychomycosis, , Pain in right toe, pain in left toes  Treatment & Plan Procedures and Treatment: Consent by patient was obtained for treatment procedures.   Debridement of mycotic and hypertrophic toenails, 1 through 5 bilateral and clearing of subungual debris. No ulceration, no infection noted.  Return Visit-Office Procedure: Patient instructed to return to the office for a follow up visit 3 months for continued evaluation and treatment.    Gardiner Barefoot DPM

## 2018-11-14 ENCOUNTER — Inpatient Hospital Stay: Payer: Medicare Other

## 2018-11-14 ENCOUNTER — Other Ambulatory Visit: Payer: Self-pay

## 2018-11-14 ENCOUNTER — Inpatient Hospital Stay (HOSPITAL_BASED_OUTPATIENT_CLINIC_OR_DEPARTMENT_OTHER): Payer: Medicare Other | Admitting: Oncology

## 2018-11-14 ENCOUNTER — Telehealth: Payer: Self-pay | Admitting: Oncology

## 2018-11-14 VITALS — BP 134/63 | HR 94 | Temp 97.7°F | Resp 18 | Ht 74.0 in | Wt 218.6 lb

## 2018-11-14 DIAGNOSIS — C7951 Secondary malignant neoplasm of bone: Secondary | ICD-10-CM

## 2018-11-14 DIAGNOSIS — C61 Malignant neoplasm of prostate: Secondary | ICD-10-CM

## 2018-11-14 DIAGNOSIS — Z5111 Encounter for antineoplastic chemotherapy: Secondary | ICD-10-CM | POA: Diagnosis not present

## 2018-11-14 DIAGNOSIS — Z95828 Presence of other vascular implants and grafts: Secondary | ICD-10-CM

## 2018-11-14 DIAGNOSIS — D6481 Anemia due to antineoplastic chemotherapy: Secondary | ICD-10-CM

## 2018-11-14 DIAGNOSIS — Z5189 Encounter for other specified aftercare: Secondary | ICD-10-CM | POA: Diagnosis not present

## 2018-11-14 DIAGNOSIS — D630 Anemia in neoplastic disease: Secondary | ICD-10-CM | POA: Diagnosis not present

## 2018-11-14 DIAGNOSIS — D649 Anemia, unspecified: Secondary | ICD-10-CM | POA: Diagnosis not present

## 2018-11-14 LAB — CBC WITH DIFFERENTIAL (CANCER CENTER ONLY)
Abs Immature Granulocytes: 0.1 10*3/uL — ABNORMAL HIGH (ref 0.00–0.07)
Basophils Absolute: 0 10*3/uL (ref 0.0–0.1)
Basophils Relative: 1 %
Eosinophils Absolute: 0 10*3/uL (ref 0.0–0.5)
Eosinophils Relative: 0 %
HCT: 27 % — ABNORMAL LOW (ref 39.0–52.0)
Hemoglobin: 8.7 g/dL — ABNORMAL LOW (ref 13.0–17.0)
Immature Granulocytes: 2 %
Lymphocytes Relative: 12 %
Lymphs Abs: 0.6 10*3/uL — ABNORMAL LOW (ref 0.7–4.0)
MCH: 32.8 pg (ref 26.0–34.0)
MCHC: 32.2 g/dL (ref 30.0–36.0)
MCV: 101.9 fL — ABNORMAL HIGH (ref 80.0–100.0)
Monocytes Absolute: 0.5 10*3/uL (ref 0.1–1.0)
Monocytes Relative: 11 %
Neutro Abs: 3.3 10*3/uL (ref 1.7–7.7)
Neutrophils Relative %: 74 %
Platelet Count: 178 10*3/uL (ref 150–400)
RBC: 2.65 MIL/uL — ABNORMAL LOW (ref 4.22–5.81)
RDW: 21.2 % — ABNORMAL HIGH (ref 11.5–15.5)
WBC Count: 4.5 10*3/uL (ref 4.0–10.5)
nRBC: 0 % (ref 0.0–0.2)

## 2018-11-14 LAB — CMP (CANCER CENTER ONLY)
ALT: <6 U/L (ref 0–44)
AST: 12 U/L — ABNORMAL LOW (ref 15–41)
Albumin: 3.3 g/dL — ABNORMAL LOW (ref 3.5–5.0)
Alkaline Phosphatase: 91 U/L (ref 38–126)
Anion gap: 11 (ref 5–15)
BUN: 19 mg/dL (ref 8–23)
CO2: 18 mmol/L — ABNORMAL LOW (ref 22–32)
Calcium: 7.9 mg/dL — ABNORMAL LOW (ref 8.9–10.3)
Chloride: 106 mmol/L (ref 98–111)
Creatinine: 1.02 mg/dL (ref 0.61–1.24)
GFR, Est AFR Am: >60 mL/min (ref >60)
GFR, Estimated: >60 mL/min (ref >60)
Glucose, Bld: 124 mg/dL — ABNORMAL HIGH (ref 70–99)
Potassium: 4.1 mmol/L (ref 3.5–5.1)
Sodium: 135 mmol/L (ref 135–145)
Total Bilirubin: 0.3 mg/dL (ref 0.3–1.2)
Total Protein: 5.8 g/dL — ABNORMAL LOW (ref 6.5–8.1)

## 2018-11-14 MED ORDER — DEXAMETHASONE SODIUM PHOSPHATE 10 MG/ML IJ SOLN
10.0000 mg | Freq: Once | INTRAMUSCULAR | Status: AC
  Administered 2018-11-14: 10 mg via INTRAVENOUS

## 2018-11-14 MED ORDER — PEGFILGRASTIM 6 MG/0.6ML ~~LOC~~ PSKT
PREFILLED_SYRINGE | SUBCUTANEOUS | Status: AC
  Filled 2018-11-14: qty 0.6

## 2018-11-14 MED ORDER — SODIUM CHLORIDE 0.9% FLUSH
10.0000 mL | Freq: Once | INTRAVENOUS | Status: AC
  Administered 2018-11-14: 10 mL
  Filled 2018-11-14: qty 10

## 2018-11-14 MED ORDER — PEGFILGRASTIM 6 MG/0.6ML ~~LOC~~ PSKT
6.0000 mg | PREFILLED_SYRINGE | Freq: Once | SUBCUTANEOUS | Status: AC
  Administered 2018-11-14: 12:00:00 6 mg via SUBCUTANEOUS

## 2018-11-14 MED ORDER — HEPARIN SOD (PORK) LOCK FLUSH 100 UNIT/ML IV SOLN
500.0000 [IU] | Freq: Once | INTRAVENOUS | Status: AC | PRN
  Administered 2018-11-14: 500 [IU]
  Filled 2018-11-14: qty 5

## 2018-11-14 MED ORDER — SODIUM CHLORIDE 0.9% FLUSH
10.0000 mL | INTRAVENOUS | Status: DC | PRN
  Administered 2018-11-14: 12:00:00 10 mL
  Filled 2018-11-14: qty 10

## 2018-11-14 MED ORDER — SODIUM CHLORIDE 0.9 % IV SOLN
60.0000 mg/m2 | Freq: Once | INTRAVENOUS | Status: AC
  Administered 2018-11-14: 11:00:00 140 mg via INTRAVENOUS
  Filled 2018-11-14: qty 14

## 2018-11-14 MED ORDER — DEXAMETHASONE SODIUM PHOSPHATE 10 MG/ML IJ SOLN
INTRAMUSCULAR | Status: AC
  Filled 2018-11-14: qty 1

## 2018-11-14 MED ORDER — SODIUM CHLORIDE 0.9 % IV SOLN
Freq: Once | INTRAVENOUS | Status: AC
  Administered 2018-11-14: 10:00:00 via INTRAVENOUS
  Filled 2018-11-14: qty 250

## 2018-11-14 NOTE — Patient Instructions (Signed)
[facility] Discharge Instructions for Patients Receiving Chemotherapy  Today you received the following chemotherapy agents Docetaxel (TAXOTERE).  To help prevent nausea and vomiting after your treatment, we encourage you to take your nausea medication as directed.   If you develop nausea and vomiting that is not controlled by your nausea medication, call the clinic.   BELOW ARE SYMPTOMS THAT SHOULD BE REPORTED IMMEDIATELY:  *FEVER GREATER THAN 100.5 F  *CHILLS WITH OR WITHOUT FEVER  NAUSEA AND VOMITING THAT IS NOT CONTROLLED WITH YOUR NAUSEA MEDICATION  *UNUSUAL SHORTNESS OF BREATH  *UNUSUAL BRUISING OR BLEEDING  TENDERNESS IN MOUTH AND THROAT WITH OR WITHOUT PRESENCE OF ULCERS  *URINARY PROBLEMS  *BOWEL PROBLEMS  UNUSUAL RASH Items with * indicate a potential emergency and should be followed up as soon as possible.  Feel free to call the clinic should you have any questions or concerns. The clinic phone number is [phone].  Please show the [facility] at check-in to the Emergency Department and triage nurse.

## 2018-11-14 NOTE — Progress Notes (Signed)
Hematology and Oncology Follow Up Visit  Edward Bailey [id number] 20-Mar-1936 83 y.o. 11/14/2018 8:16 AM [name], MDGriffin, [name], MD   Principle Diagnosis: 83 year old man with castration-resistant prostate cancer with disease to the bone diagnosed in 2017.  He was was found to have Gleason score 3+4 = 7 and a PSA of 4 at the time of diagnosis in 1999.   Prior Therapy:  He is status post prostatectomy and found to have stage T3b disease. He received salvage radiation therapy for a rise in his PSA.  He developed biochemical relapse and was treated with androgen depravation intermittently between 2010 to about 2015.  He developed castration resistant disease in 2015.  Ketoconazole at 200 mg twice a day and prednisone 5 mg daily. Therapy discontinued on 05/24/2016 because of progression of disease. Zytiga 1000 mg daily with prednisone at 5 mg daily started in November 2017. Xtandi 160 mg daily started in April 2019 therapy discontinued due to progression of disease and July 2019. [name] started in July 2019.  He completed 6 months of therapy in December 2019. Xtandi 160 mg resumed in August 2019 after brief discontinuation in July 2019.  Therapy discontinued permanently in January 2020 for progression of disease.  Current therapy: Taxotere chemotherapy started on August 21, 2018 with 75 mg/m.  He is here for cycle 5 of therapy.    Interim History:  Mr. Edward Bailey returns today for a repeat evaluation.  Since the last visit, he reports no major complications related to chemotherapy.  He denies any nausea, vomiting or diarrhea.  He denies any infusion related complications.  He does report some fatigue and tiredness but no dyspnea on exertion or cough.  He denies any recent hospitalizations or illnesses.  He denies any bone pain or pathological fractures.  He denied headaches, blurry vision, syncope or seizures.  Denies any fevers, chills or sweats.  Denied chest pain, palpitation,  orthopnea or leg edema.  Denied cough, wheezing or hemoptysis.  Denied nausea, vomiting or abdominal pain.  Denies any constipation or diarrhea.  Denies any frequency urgency or hesitancy.  Denies any arthralgias or myalgias.  Denies any skin rashes or lesions.  Denies any bleeding or clotting tendency.  Denies any easy bruising.  Denies any hair or nail changes.  Denies any anxiety or depression.  Remaining review of system is negative.             Medications: I have reviewed the patient's current medications.  Current Outpatient Medications  Medication Sig Dispense Refill  . amLODipine (NORVASC) 5 MG tablet TK 1 T PO QD  3  . aspirin (ASPIRIN EC) 81 MG EC tablet Take 81 mg by mouth daily. Swallow whole.    Marland Kitchen azelastine (ASTELIN) 0.1 % nasal spray Place 1-2 sprays into both nostrils daily as needed. allergies  5  . cetirizine (ZYRTEC) 10 MG tablet Take 10 mg by mouth daily as needed for allergies. As needed    . fluticasone (FLONASE) 50 MCG/ACT nasal spray Place 1 spray into both nostrils daily as needed. Allergies  5  . levothyroxine (SYNTHROID, LEVOTHROID) 112 MCG tablet TK 1 T PO QD IN THE MORNING OES  12  . levothyroxine (SYNTHROID, LEVOTHROID) 125 MCG tablet Alternates with 112 mcg  9  . lidocaine-prilocaine (EMLA) cream Apply 1 application topically as needed. 30 g 0  . Multiple Vitamins-Minerals (MULTIVITAMIN PO) Take 1 capsule by mouth daily.    . prochlorperazine (COMPAZINE) 10 MG tablet Take 1 tablet (10 mg  total) by mouth every 6 (six) hours as needed for nausea or vomiting. 30 tablet 0   No current facility-administered medications for this visit.      Allergies:  Allergies  Allergen Reactions  . Amoxicillin Rash    Has patient had a PCN reaction causing immediate rash, facial/tongue/throat swelling, SOB or lightheadedness with hypotension: No Has patient had a PCN reaction causing severe rash involving mucus membranes or skin necrosis: No Has patient had a PCN  reaction that required hospitalization No Has patient had a PCN reaction occurring within the last 10 years: Yes If all of the above answers are "NO", then may proceed with Cephalosporin use.     Past Medical History, Surgical history, Social history, and Family History were reviewed and updated.   Physical Exam:  Blood pressure 134/63, pulse 94, temperature 97.7 F (36.5 C), temperature source Oral, resp. rate 18, height 6\' 2"  (1.88 m), weight 218 lb 9.6 oz (99.2 kg), SpO2 100 %.      ECOG: 1    General appearance: Comfortable appearing without any discomfort Head: Normocephalic without any trauma Oropharynx: Mucous membranes are moist and pink without any thrush or ulcers. Eyes: Pupils are equal and round reactive to light. Lymph nodes: No cervical, supraclavicular, inguinal or axillary lymphadenopathy.   Heart:regular rate and rhythm.  S1 and S2 without leg edema. Lung: Clear without any rhonchi or wheezes.  No dullness to percussion. Abdomin: Soft, nontender, nondistended with good bowel sounds.  No hepatosplenomegaly. Musculoskeletal: No joint deformity or effusion.  Full range of motion noted. Neurological: No deficits noted on motor, sensory and deep tendon reflex exam. Skin: No petechial rash or dryness.  Appeared moist.           Lab Results: Lab Results  Component Value Date   WBC 5.3 10/24/2018   HGB 9.5 (L) 10/24/2018   HCT 29.3 (L) 10/24/2018   MCV 96.7 10/24/2018   PLT 231 10/24/2018     Chemistry      Component Value Date/Time   NA 134 (L) 10/24/2018 1310   NA 139 06/07/2017 1057   K 4.2 10/24/2018 1310   K 3.6 06/07/2017 1057   CL 103 10/24/2018 1310   CO2 23 10/24/2018 1310   CO2 26 06/07/2017 1057   BUN 19 10/24/2018 1310   BUN 18.5 06/07/2017 1057   CREATININE 0.84 10/24/2018 1310   CREATININE 1.2 06/07/2017 1057      Component Value Date/Time   CALCIUM 8.4 (L) 10/24/2018 1310   CALCIUM 9.0 06/07/2017 1057   ALKPHOS 113  10/24/2018 1310   ALKPHOS 112 06/07/2017 1057   AST 15 10/24/2018 1310   AST 14 06/07/2017 1057   ALT 8 10/24/2018 1310   ALT 12 06/07/2017 1057   BILITOT 0.5 10/24/2018 1310   BILITOT 0.66 06/07/2017 1057       Results for Edward, BAKKEN (MRN [id number]) as of 11/14/2018 08:18  Ref. Range 08/21/2018 11:28 09/12/2018 11:37 10/03/2018 11:20 10/24/2018 13:10  Prostate Specific Ag, Serum Latest Ref Range: 0.0 - 4.0 ng/mL 713.0 (H) 587.0 (H) 557.0 (H) 504.0 (H)       Impression and Plan:  83 year old man with  1.  Castration-resistant prostate cancer with disease to the bone with multiple therapies outlined above.  Marland Kitchen   He continues to tolerate Taxotere chemotherapy with few complications.  His PSA is showing excellent response to treatment with decline down to 504 from 713 range.  He does report some fatigue but otherwise hemotherapy  has been well-tolerated.  Risks and benefits of continuing this treatment was discussed today.  Long-term complications were reiterated.  At this time he is agreeable to continue with a goal of 10 cycles of therapy depending on his tolerance.      2. Androgen depravation: I recommended continuing androgen deprivation indefinitely and he is currently receiving that at [facility].  3.  Bone directed therapy: He is at risk of developing pathological fractures and I recommended continuing Xgeva which she is currently receiving [facility].   4.  Anemia: Related to malignancy and chemotherapy.  His hemoglobin slightly lower but does not require transfusion at this time.  5.  IV access: Port-A-Cath remains in place without any issues.  6.  Antiemetics: No nausea or vomiting reported.  Compazine is available to him.  7.  Neutropenia prophylaxis: He will continue to receive Neulasta onpro given his risk of neutropenic sepsis.  8.  Prognosis and goals of care: His disease is incurable but aggressive therapy is warranted given his performance  status remains adequate.  9. Followup: In 3 weeks for his next evaluation prior to next cycle of chemotherapy.   25 minutes was spent with the patient face-to-face today.  More than 50% of time was dedicated to reviewing his disease status, treatment options and complications related to therapy.  Zola Button, MD 4/24/20208:16 AM

## 2018-11-14 NOTE — Telephone Encounter (Signed)
Scheduled appt per 4/24 sch message. °

## 2018-11-15 ENCOUNTER — Inpatient Hospital Stay: Payer: Medicare Other

## 2018-11-15 LAB — PROSTATE-SPECIFIC AG, SERUM (LABCORP): Prostate Specific Ag, Serum: 480 ng/mL — ABNORMAL HIGH (ref 0.0–4.0)

## 2018-11-17 ENCOUNTER — Telehealth: Payer: Self-pay

## 2018-11-17 NOTE — Telephone Encounter (Signed)
Attempted to call patient to let him know that per Dr. Alen Blew his PSA is declining. Called [phone] and each time the phone would ring once followed by a busy signal. Will continue to attempt to reach patient.

## 2018-11-18 ENCOUNTER — Telehealth: Payer: Self-pay

## 2018-11-18 NOTE — Telephone Encounter (Signed)
Contacted patient and made him aware of PSA result. Patient verbalized understanding and had no other questions or concerns.

## 2018-11-26 DIAGNOSIS — C61 Malignant neoplasm of prostate: Secondary | ICD-10-CM | POA: Diagnosis not present

## 2018-11-26 DIAGNOSIS — C7951 Secondary malignant neoplasm of bone: Secondary | ICD-10-CM | POA: Diagnosis not present

## 2018-12-05 ENCOUNTER — Inpatient Hospital Stay: Payer: Medicare Other

## 2018-12-05 ENCOUNTER — Other Ambulatory Visit: Payer: Self-pay

## 2018-12-05 ENCOUNTER — Inpatient Hospital Stay: Payer: Medicare Other | Attending: Oncology

## 2018-12-05 ENCOUNTER — Inpatient Hospital Stay (HOSPITAL_BASED_OUTPATIENT_CLINIC_OR_DEPARTMENT_OTHER): Payer: Medicare Other | Admitting: Oncology

## 2018-12-05 VITALS — BP 123/61 | HR 82 | Temp 97.6°F | Resp 18 | Ht 74.0 in | Wt 216.9 lb

## 2018-12-05 DIAGNOSIS — C61 Malignant neoplasm of prostate: Secondary | ICD-10-CM

## 2018-12-05 DIAGNOSIS — C7951 Secondary malignant neoplasm of bone: Secondary | ICD-10-CM

## 2018-12-05 DIAGNOSIS — Z5111 Encounter for antineoplastic chemotherapy: Secondary | ICD-10-CM | POA: Diagnosis not present

## 2018-12-05 DIAGNOSIS — Z923 Personal history of irradiation: Secondary | ICD-10-CM | POA: Insufficient documentation

## 2018-12-05 DIAGNOSIS — D649 Anemia, unspecified: Secondary | ICD-10-CM | POA: Insufficient documentation

## 2018-12-05 DIAGNOSIS — Z5189 Encounter for other specified aftercare: Secondary | ICD-10-CM | POA: Insufficient documentation

## 2018-12-05 DIAGNOSIS — Z79899 Other long term (current) drug therapy: Secondary | ICD-10-CM | POA: Insufficient documentation

## 2018-12-05 DIAGNOSIS — Z7951 Long term (current) use of inhaled steroids: Secondary | ICD-10-CM | POA: Diagnosis not present

## 2018-12-05 DIAGNOSIS — Z7982 Long term (current) use of aspirin: Secondary | ICD-10-CM | POA: Insufficient documentation

## 2018-12-05 DIAGNOSIS — Z9079 Acquired absence of other genital organ(s): Secondary | ICD-10-CM | POA: Insufficient documentation

## 2018-12-05 DIAGNOSIS — Z95828 Presence of other vascular implants and grafts: Secondary | ICD-10-CM

## 2018-12-05 LAB — CBC WITH DIFFERENTIAL (CANCER CENTER ONLY)
Abs Immature Granulocytes: 0.06 10*3/uL (ref 0.00–0.07)
Basophils Absolute: 0.1 10*3/uL (ref 0.0–0.1)
Basophils Relative: 1 %
Eosinophils Absolute: 0 10*3/uL (ref 0.0–0.5)
Eosinophils Relative: 1 %
HCT: 27.1 % — ABNORMAL LOW (ref 39.0–52.0)
Hemoglobin: 8.8 g/dL — ABNORMAL LOW (ref 13.0–17.0)
Immature Granulocytes: 1 %
Lymphocytes Relative: 10 %
Lymphs Abs: 0.5 10*3/uL — ABNORMAL LOW (ref 0.7–4.0)
MCH: 33.7 pg (ref 26.0–34.0)
MCHC: 32.5 g/dL (ref 30.0–36.0)
MCV: 103.8 fL — ABNORMAL HIGH (ref 80.0–100.0)
Monocytes Absolute: 0.5 10*3/uL (ref 0.1–1.0)
Monocytes Relative: 10 %
Neutro Abs: 3.8 10*3/uL (ref 1.7–7.7)
Neutrophils Relative %: 77 %
Platelet Count: 170 10*3/uL (ref 150–400)
RBC: 2.61 MIL/uL — ABNORMAL LOW (ref 4.22–5.81)
RDW: 19.6 % — ABNORMAL HIGH (ref 11.5–15.5)
WBC Count: 5 10*3/uL (ref 4.0–10.5)
nRBC: 0 % (ref 0.0–0.2)

## 2018-12-05 LAB — CMP (CANCER CENTER ONLY)
ALT: 10 U/L (ref 0–44)
AST: 15 U/L (ref 15–41)
Albumin: 3.5 g/dL (ref 3.5–5.0)
Alkaline Phosphatase: 82 U/L (ref 38–126)
Anion gap: 7 (ref 5–15)
BUN: 16 mg/dL (ref 8–23)
CO2: 24 mmol/L (ref 22–32)
Calcium: 8.3 mg/dL — ABNORMAL LOW (ref 8.9–10.3)
Chloride: 104 mmol/L (ref 98–111)
Creatinine: 0.87 mg/dL (ref 0.61–1.24)
GFR, Est AFR Am: >60 mL/min (ref >60)
GFR, Estimated: >60 mL/min (ref >60)
Glucose, Bld: 100 mg/dL — ABNORMAL HIGH (ref 70–99)
Potassium: 4.2 mmol/L (ref 3.5–5.1)
Sodium: 135 mmol/L (ref 135–145)
Total Bilirubin: 0.4 mg/dL (ref 0.3–1.2)
Total Protein: 5.9 g/dL — ABNORMAL LOW (ref 6.5–8.1)

## 2018-12-05 MED ORDER — SODIUM CHLORIDE 0.9 % IV SOLN
60.0000 mg/m2 | Freq: Once | INTRAVENOUS | Status: AC
  Administered 2018-12-05: 14:00:00 140 mg via INTRAVENOUS
  Filled 2018-12-05: qty 14

## 2018-12-05 MED ORDER — SODIUM CHLORIDE 0.9 % IV SOLN
Freq: Once | INTRAVENOUS | Status: AC
  Administered 2018-12-05: 14:00:00 via INTRAVENOUS
  Filled 2018-12-05: qty 250

## 2018-12-05 MED ORDER — SODIUM CHLORIDE 0.9% FLUSH
10.0000 mL | INTRAVENOUS | Status: DC | PRN
  Administered 2018-12-05: 16:00:00 10 mL
  Filled 2018-12-05: qty 10

## 2018-12-05 MED ORDER — HEPARIN SOD (PORK) LOCK FLUSH 100 UNIT/ML IV SOLN
500.0000 [IU] | Freq: Once | INTRAVENOUS | Status: AC | PRN
  Administered 2018-12-05: 16:00:00 500 [IU]
  Filled 2018-12-05: qty 5

## 2018-12-05 MED ORDER — PEGFILGRASTIM 6 MG/0.6ML ~~LOC~~ PSKT
PREFILLED_SYRINGE | SUBCUTANEOUS | Status: AC
  Filled 2018-12-05: qty 0.6

## 2018-12-05 MED ORDER — SODIUM CHLORIDE 0.9% FLUSH
10.0000 mL | Freq: Once | INTRAVENOUS | Status: AC
  Administered 2018-12-05: 10 mL
  Filled 2018-12-05: qty 10

## 2018-12-05 MED ORDER — PEGFILGRASTIM 6 MG/0.6ML ~~LOC~~ PSKT
6.0000 mg | PREFILLED_SYRINGE | Freq: Once | SUBCUTANEOUS | Status: AC
  Administered 2018-12-05: 16:00:00 6 mg via SUBCUTANEOUS

## 2018-12-05 MED ORDER — DEXAMETHASONE SODIUM PHOSPHATE 10 MG/ML IJ SOLN
INTRAMUSCULAR | Status: AC
  Filled 2018-12-05: qty 1

## 2018-12-05 MED ORDER — DEXAMETHASONE SODIUM PHOSPHATE 10 MG/ML IJ SOLN
10.0000 mg | Freq: Once | INTRAMUSCULAR | Status: AC
  Administered 2018-12-05: 10 mg via INTRAVENOUS

## 2018-12-05 NOTE — Patient Instructions (Signed)
[facility] Discharge Instructions for Patients Receiving Chemotherapy  Today you received the following chemotherapy agents: Taxotere  To help prevent nausea and vomiting after your treatment, we encourage you to take your nausea medication as directed.    If you develop nausea and vomiting that is not controlled by your nausea medication, call the clinic.   BELOW ARE SYMPTOMS THAT SHOULD BE REPORTED IMMEDIATELY:  *FEVER GREATER THAN 100.5 F  *CHILLS WITH OR WITHOUT FEVER  NAUSEA AND VOMITING THAT IS NOT CONTROLLED WITH YOUR NAUSEA MEDICATION  *UNUSUAL SHORTNESS OF BREATH  *UNUSUAL BRUISING OR BLEEDING  TENDERNESS IN MOUTH AND THROAT WITH OR WITHOUT PRESENCE OF ULCERS  *URINARY PROBLEMS  *BOWEL PROBLEMS  UNUSUAL RASH Items with * indicate a potential emergency and should be followed up as soon as possible.  Feel free to call the clinic should you have any questions or concerns. The clinic phone number is [phone].  Please show the CHEMO ALERT CARD at check-in to the Emergency Department and triage nurse.   

## 2018-12-05 NOTE — Progress Notes (Signed)
Hematology and Oncology Follow Up Visit  Edward Bailey [id number] 08/08/1935 83 y.o. 12/05/2018 1:14 PM [name], MDGriffin, [name], MD   Principle Diagnosis: 83 year old man with advanced prostate cancer with disease to the bone diagnosed in 2017.  He has castration-resistant after presenting with Gleason score 3+4 = 7 and a PSA of 4 in 1999.   Prior Therapy:  He is status post prostatectomy and found to have stage T3b disease. He received salvage radiation therapy for a rise in his PSA.  He developed biochemical relapse and was treated with androgen depravation intermittently between 2010 to about 2015.  He developed castration resistant disease in 2015.  Ketoconazole at 200 mg twice a day and prednisone 5 mg daily. Therapy discontinued on 05/24/2016 because of progression of disease. Zytiga 1000 mg daily with prednisone at 5 mg daily started in November 2017. Xtandi 160 mg daily started in April 2019 therapy discontinued due to progression of disease and July 2019. [name] started in July 2019.  He completed 6 months of therapy in December 2019. Xtandi 160 mg resumed in August 2019 after brief discontinuation in July 2019.  Therapy discontinued permanently in January 2020 for progression of disease.  Current therapy: Taxotere chemotherapy started on August 21, 2018 with 75 mg/m.  He is here for cycle 6 of therapy.    Interim History:  Edward Bailey is here for a repeat evaluation.  Since the last visit, he reports no major changes in his health.  He tolerated chemotherapy without any major complications.  He denies any nausea, vomiting or diarrhea.  He denies any worsening neuropathy.  His appetite remained reasonable although he lost 2 pounds.  He does report some mild fatigue that is manageable at this time.  Patient denied any alteration mental status, neuropathy, confusion or dizziness.  Denies any headaches or lethargy.  Denies any night sweats, weight loss or changes in  appetite.  Denied orthopnea, dyspnea on exertion or chest discomfort.  Denies shortness of breath, difficulty breathing hemoptysis or cough.  Denies any abdominal distention, nausea, early satiety or dyspepsia.  Denies any hematuria, frequency, dysuria or nocturia.  Denies any skin irritation, dryness or rash.  Denies any ecchymosis or petechiae.  Denies any lymphadenopathy or clotting.  Denies any heat or cold intolerance.  Denies any anxiety or depression.  Remaining review of system is negative.              Medications: I have reviewed the patient's current medications.  Current Outpatient Medications  Medication Sig Dispense Refill  . amLODipine (NORVASC) 5 MG tablet TK 1 T PO QD  3  . aspirin (ASPIRIN EC) 81 MG EC tablet Take 81 mg by mouth daily. Swallow whole.    Marland Kitchen azelastine (ASTELIN) 0.1 % nasal spray Place 1-2 sprays into both nostrils daily as needed. allergies  5  . cetirizine (ZYRTEC) 10 MG tablet Take 10 mg by mouth daily as needed for allergies. As needed    . fluticasone (FLONASE) 50 MCG/ACT nasal spray Place 1 spray into both nostrils daily as needed. Allergies  5  . levothyroxine (SYNTHROID, LEVOTHROID) 112 MCG tablet TK 1 T PO QD IN THE MORNING OES  12  . levothyroxine (SYNTHROID, LEVOTHROID) 125 MCG tablet Alternates with 112 mcg  9  . lidocaine-prilocaine (EMLA) cream Apply 1 application topically as needed. 30 g 0  . Multiple Vitamins-Minerals (MULTIVITAMIN PO) Take 1 capsule by mouth daily.    . prochlorperazine (COMPAZINE) 10 MG tablet Take 1  tablet (10 mg total) by mouth every 6 (six) hours as needed for nausea or vomiting. 30 tablet 0   No current facility-administered medications for this visit.      Allergies:  Allergies  Allergen Reactions  . Amoxicillin Rash    Has patient had a PCN reaction causing immediate rash, facial/tongue/throat swelling, SOB or lightheadedness with hypotension: No Has patient had a PCN reaction causing severe rash involving  mucus membranes or skin necrosis: No Has patient had a PCN reaction that required hospitalization No Has patient had a PCN reaction occurring within the last 10 years: Yes If all of the above answers are "NO", then may proceed with Cephalosporin use.     Past Medical History, Surgical history, Social history, and Family History were reviewed and updated.   Physical Exam:  Blood pressure 123/61, pulse 82, temperature 97.6 F (36.4 C), temperature source Oral, resp. rate 18, height 6\' 2"  (1.88 m), weight 216 lb 14.4 oz (98.4 kg), SpO2 100 %.      ECOG: 1   General appearance: Alert, awake without any distress. Head: Atraumatic without abnormalities Oropharynx: Without any thrush or ulcers. Eyes: No scleral icterus. Lymph nodes: No lymphadenopathy noted in the cervical, supraclavicular, or axillary nodes Heart:regular rate and rhythm, without any murmurs or gallops.   Lung: Clear to auscultation without any rhonchi, wheezes or dullness to percussion. Abdomin: Soft, nontender without any shifting dullness or ascites. Musculoskeletal: No clubbing or cyanosis. Neurological: No motor or sensory deficits. Skin: No rashes or lesions.            Lab Results: Lab Results  Component Value Date   WBC 5.0 12/05/2018   HGB 8.8 (L) 12/05/2018   HCT 27.1 (L) 12/05/2018   MCV 103.8 (H) 12/05/2018   PLT 170 12/05/2018     Chemistry      Component Value Date/Time   NA 135 11/14/2018 0807   NA 139 06/07/2017 1057   K 4.1 11/14/2018 0807   K 3.6 06/07/2017 1057   CL 106 11/14/2018 0807   CO2 18 (L) 11/14/2018 0807   CO2 26 06/07/2017 1057   BUN 19 11/14/2018 0807   BUN 18.5 06/07/2017 1057   CREATININE 1.02 11/14/2018 0807   CREATININE 1.2 06/07/2017 1057      Component Value Date/Time   CALCIUM 7.9 (L) 11/14/2018 0807   CALCIUM 9.0 06/07/2017 1057   ALKPHOS 91 11/14/2018 0807   ALKPHOS 112 06/07/2017 1057   AST 12 (L) 11/14/2018 0807   AST 14 06/07/2017 1057   ALT  <6 11/14/2018 0807   ALT 12 06/07/2017 1057   BILITOT 0.3 11/14/2018 0807   BILITOT 0.66 06/07/2017 1057      Results for Edward Bailey (MRN [id number]) as of 12/05/2018 13:06  Ref. Range 10/24/2018 13:10 11/14/2018 08:07  Prostate Specific Ag, Serum Latest Ref Range: 0.0 - 4.0 ng/mL 504.0 (H) 480.0 (H)         Impression and Plan:  83 year old man with  1.  Advanced prostate cancer with disease to the bone that is currently castration-resistant.   He remains on Taxotere chemotherapy with reasonable PSA response.  He has tolerated his therapy without any major complications at this time.  Risks and benefits of continuing this treatment was reviewed and he is agreeable to continue.  His PSA continues to show steady decline with few complications.  Long-term issues associated with continuing treatment including nausea, fatigue and myelosuppression.      2. Androgen depravation: I  recommended continuing this indefinitely which she is currently receiving under the care of [facility].  3.  Bone directed therapy: He is receiving Xgeva under the care of [facility].   4.  Anemia: His hemoglobin remains relatively stable without any need for transfusion.  His anemia is related to malignancy and chemotherapy.  5.  IV access: Port-A-Cath continues to be accessed without any difficulties.  6.  Antiemetics: Manageable with the current antiemetics going Compazine.  7.  Neutropenia prophylaxis: He is currently on Neulasta onpro and will continue after each cycle.  Risk of bleeding sepsis is high.  8.  Prognosis and goals of care: Therapy remains palliative and aggressive therapy is warranted at this time.  9. Followup: We will be in 3 weeks for repeat chemotherapy cycle.   25 minutes was spent with the patient face-to-face today.  More than 50% of time was spent on reviewing his disease status, treatment options and discussing long-term prognosis.  Zola Button,  MD 5/15/20201:14 PM

## 2018-12-06 LAB — PROSTATE-SPECIFIC AG, SERUM (LABCORP): Prostate Specific Ag, Serum: 490 ng/mL — ABNORMAL HIGH (ref 0.0–4.0)

## 2018-12-08 ENCOUNTER — Ambulatory Visit: Payer: Medicare Other

## 2018-12-08 ENCOUNTER — Telehealth: Payer: Self-pay

## 2018-12-08 ENCOUNTER — Telehealth: Payer: Self-pay | Admitting: Oncology

## 2018-12-08 NOTE — Telephone Encounter (Signed)
Tried to reach regarding addition to schedule

## 2018-12-08 NOTE — Telephone Encounter (Signed)
Patient made aware of PSA and verbalized understanding.

## 2018-12-08 NOTE — Telephone Encounter (Signed)
-----   Message from Wyatt Portela, MD sent at 12/08/2018  8:14 AM EDT ----- Please let him know his PSA slightly up. No changes for now.

## 2018-12-26 ENCOUNTER — Inpatient Hospital Stay: Payer: Medicare Other

## 2018-12-26 ENCOUNTER — Other Ambulatory Visit: Payer: Self-pay

## 2018-12-26 ENCOUNTER — Inpatient Hospital Stay: Payer: Medicare Other | Attending: Oncology

## 2018-12-26 ENCOUNTER — Inpatient Hospital Stay (HOSPITAL_BASED_OUTPATIENT_CLINIC_OR_DEPARTMENT_OTHER): Payer: Medicare Other | Admitting: Oncology

## 2018-12-26 VITALS — BP 119/62 | HR 85 | Temp 97.6°F | Resp 18 | Ht 74.0 in | Wt 217.0 lb

## 2018-12-26 DIAGNOSIS — C7951 Secondary malignant neoplasm of bone: Secondary | ICD-10-CM | POA: Insufficient documentation

## 2018-12-26 DIAGNOSIS — D6481 Anemia due to antineoplastic chemotherapy: Secondary | ICD-10-CM | POA: Diagnosis not present

## 2018-12-26 DIAGNOSIS — Z5189 Encounter for other specified aftercare: Secondary | ICD-10-CM | POA: Diagnosis not present

## 2018-12-26 DIAGNOSIS — C61 Malignant neoplasm of prostate: Secondary | ICD-10-CM

## 2018-12-26 DIAGNOSIS — Z95828 Presence of other vascular implants and grafts: Secondary | ICD-10-CM

## 2018-12-26 DIAGNOSIS — Z5111 Encounter for antineoplastic chemotherapy: Secondary | ICD-10-CM | POA: Diagnosis not present

## 2018-12-26 LAB — CMP (CANCER CENTER ONLY)
ALT: 6 U/L (ref 0–44)
AST: 14 U/L — ABNORMAL LOW (ref 15–41)
Albumin: 3.4 g/dL — ABNORMAL LOW (ref 3.5–5.0)
Alkaline Phosphatase: 71 U/L (ref 38–126)
Anion gap: 9 (ref 5–15)
BUN: 11 mg/dL (ref 8–23)
CO2: 21 mmol/L — ABNORMAL LOW (ref 22–32)
Calcium: 8 mg/dL — ABNORMAL LOW (ref 8.9–10.3)
Chloride: 105 mmol/L (ref 98–111)
Creatinine: 0.85 mg/dL (ref 0.61–1.24)
GFR, Est AFR Am: >60 mL/min (ref >60)
GFR, Estimated: >60 mL/min (ref >60)
Glucose, Bld: 97 mg/dL (ref 70–99)
Potassium: 4.2 mmol/L (ref 3.5–5.1)
Sodium: 135 mmol/L (ref 135–145)
Total Bilirubin: 0.4 mg/dL (ref 0.3–1.2)
Total Protein: 5.8 g/dL — ABNORMAL LOW (ref 6.5–8.1)

## 2018-12-26 LAB — CBC WITH DIFFERENTIAL (CANCER CENTER ONLY)
Abs Immature Granulocytes: 0.07 10*3/uL (ref 0.00–0.07)
Basophils Absolute: 0 10*3/uL (ref 0.0–0.1)
Basophils Relative: 1 %
Eosinophils Absolute: 0 10*3/uL (ref 0.0–0.5)
Eosinophils Relative: 1 %
HCT: 26.7 % — ABNORMAL LOW (ref 39.0–52.0)
Hemoglobin: 8.6 g/dL — ABNORMAL LOW (ref 13.0–17.0)
Immature Granulocytes: 1 %
Lymphocytes Relative: 8 %
Lymphs Abs: 0.4 10*3/uL — ABNORMAL LOW (ref 0.7–4.0)
MCH: 33.7 pg (ref 26.0–34.0)
MCHC: 32.2 g/dL (ref 30.0–36.0)
MCV: 104.7 fL — ABNORMAL HIGH (ref 80.0–100.0)
Monocytes Absolute: 0.5 10*3/uL (ref 0.1–1.0)
Monocytes Relative: 10 %
Neutro Abs: 4.1 10*3/uL (ref 1.7–7.7)
Neutrophils Relative %: 79 %
Platelet Count: 163 10*3/uL (ref 150–400)
RBC: 2.55 MIL/uL — ABNORMAL LOW (ref 4.22–5.81)
RDW: 17.7 % — ABNORMAL HIGH (ref 11.5–15.5)
WBC Count: 5.2 10*3/uL (ref 4.0–10.5)
nRBC: 0 % (ref 0.0–0.2)

## 2018-12-26 MED ORDER — PEGFILGRASTIM 6 MG/0.6ML ~~LOC~~ PSKT
PREFILLED_SYRINGE | SUBCUTANEOUS | Status: AC
  Filled 2018-12-26: qty 0.6

## 2018-12-26 MED ORDER — SODIUM CHLORIDE 0.9 % IV SOLN
60.0000 mg/m2 | Freq: Once | INTRAVENOUS | Status: AC
  Administered 2018-12-26: 14:00:00 140 mg via INTRAVENOUS
  Filled 2018-12-26: qty 14

## 2018-12-26 MED ORDER — SODIUM CHLORIDE 0.9 % IV SOLN
Freq: Once | INTRAVENOUS | Status: AC
  Administered 2018-12-26: 13:00:00 via INTRAVENOUS
  Filled 2018-12-26: qty 250

## 2018-12-26 MED ORDER — SODIUM CHLORIDE 0.9% FLUSH
10.0000 mL | Freq: Once | INTRAVENOUS | Status: AC
  Administered 2018-12-26: 12:00:00 10 mL
  Filled 2018-12-26: qty 10

## 2018-12-26 MED ORDER — DEXAMETHASONE SODIUM PHOSPHATE 10 MG/ML IJ SOLN
INTRAMUSCULAR | Status: AC
  Filled 2018-12-26: qty 1

## 2018-12-26 MED ORDER — HEPARIN SOD (PORK) LOCK FLUSH 100 UNIT/ML IV SOLN
500.0000 [IU] | Freq: Once | INTRAVENOUS | Status: AC | PRN
  Administered 2018-12-26: 500 [IU]
  Filled 2018-12-26: qty 5

## 2018-12-26 MED ORDER — DEXAMETHASONE SODIUM PHOSPHATE 10 MG/ML IJ SOLN
10.0000 mg | Freq: Once | INTRAMUSCULAR | Status: AC
  Administered 2018-12-26: 13:00:00 10 mg via INTRAVENOUS

## 2018-12-26 MED ORDER — SODIUM CHLORIDE 0.9% FLUSH
10.0000 mL | INTRAVENOUS | Status: DC | PRN
  Administered 2018-12-26: 10 mL
  Filled 2018-12-26: qty 10

## 2018-12-26 MED ORDER — PEGFILGRASTIM 6 MG/0.6ML ~~LOC~~ PSKT
6.0000 mg | PREFILLED_SYRINGE | Freq: Once | SUBCUTANEOUS | Status: AC
  Administered 2018-12-26: 6 mg via SUBCUTANEOUS

## 2018-12-26 NOTE — Patient Instructions (Signed)
[facility] Discharge Instructions for Patients Receiving Chemotherapy  Today you received the following chemotherapy agents: Taxotere  To help prevent nausea and vomiting after your treatment, we encourage you to take your nausea medication as directed.    If you develop nausea and vomiting that is not controlled by your nausea medication, call the clinic.   BELOW ARE SYMPTOMS THAT SHOULD BE REPORTED IMMEDIATELY:  *FEVER GREATER THAN 100.5 F  *CHILLS WITH OR WITHOUT FEVER  NAUSEA AND VOMITING THAT IS NOT CONTROLLED WITH YOUR NAUSEA MEDICATION  *UNUSUAL SHORTNESS OF BREATH  *UNUSUAL BRUISING OR BLEEDING  TENDERNESS IN MOUTH AND THROAT WITH OR WITHOUT PRESENCE OF ULCERS  *URINARY PROBLEMS  *BOWEL PROBLEMS  UNUSUAL RASH Items with * indicate a potential emergency and should be followed up as soon as possible.  Feel free to call the clinic should you have any questions or concerns. The clinic phone number is [phone].  Please show the CHEMO ALERT CARD at check-in to the Emergency Department and triage nurse.   

## 2018-12-26 NOTE — Progress Notes (Signed)
Hematology and Oncology Follow Up Visit  Edward Bailey [id number] Oct 06, 1935 83 y.o. 12/26/2018 11:49 AM Edward Bailey, MDGriffin, Edward Reichmann, MD   Principle Diagnosis: 83 year old man with castration-resistant prostate cancer with disease to the bone diagnosed in 2017.  He was initially diagnosed with Gleason score 3+4 = 7 and a PSA of 4 in 1999.   Prior Therapy:  He is status post prostatectomy and found to have stage T3b disease. He received salvage radiation therapy for a rise in his PSA.  He developed biochemical relapse and was treated with androgen depravation intermittently between 2010 to about 2015.  He developed castration resistant disease in 2015.  Ketoconazole at 200 mg twice a day and prednisone 5 mg daily. Therapy discontinued on 05/24/2016 because of progression of disease. Zytiga 1000 mg daily with prednisone at 5 mg daily started in November 2017. Xtandi 160 mg daily started in April 2019 therapy discontinued due to progression of disease and July 2019. [name] started in July 2019.  He completed 6 months of therapy in December 2019. Xtandi 160 mg resumed in August 2019 after brief discontinuation in July 2019.  Therapy discontinued permanently in January 2020 for progression of disease.  Current therapy: Taxotere chemotherapy started on August 21, 2018 with 75 mg/m.  He is here for cycle 7 of therapy.    Interim History:  Mr. Edward Bailey is here for a follow-up.  Since last visit, he reports no major changes in his health.  He tolerated last cycle of therapy without any major complaints.  He denies any worsening nausea, fatigue or neuropathy.  He does report some weakness that is generalized but still ambulates without any difficulties.  He denies any chest pain or palpitation.  Denies any hospitalization or falls.  He denied headaches, blurry vision, syncope or seizures.  Denies any fevers, chills or sweats.  Denied chest pain, palpitation, orthopnea or leg edema.  Denied  cough, wheezing or hemoptysis.  Denied nausea, vomiting or abdominal pain.  Denies any constipation or diarrhea.  Denies any frequency urgency or hesitancy.  Denies any arthralgias or myalgias.  Denies any skin rashes or lesions.  Denies any bleeding or clotting tendency.  Denies any easy bruising.  Denies any hair or nail changes.  Denies any anxiety or depression.  Remaining review of system is negative.    Medications: I have reviewed the patient's current medications.  Current Outpatient Medications  Medication Sig Dispense Refill  . amLODipine (NORVASC) 5 MG tablet TK 1 T PO QD  3  . aspirin (ASPIRIN EC) 81 MG EC tablet Take 81 mg by mouth daily. Swallow whole.    Marland Kitchen azelastine (ASTELIN) 0.1 % nasal spray Place 1-2 sprays into both nostrils daily as needed. allergies  5  . cetirizine (ZYRTEC) 10 MG tablet Take 10 mg by mouth daily as needed for allergies. As needed    . fluticasone (FLONASE) 50 MCG/ACT nasal spray Place 1 spray into both nostrils daily as needed. Allergies  5  . levothyroxine (SYNTHROID, LEVOTHROID) 112 MCG tablet TK 1 T PO QD IN THE MORNING OES  12  . levothyroxine (SYNTHROID, LEVOTHROID) 125 MCG tablet Alternates with 112 mcg  9  . lidocaine-prilocaine (EMLA) cream Apply 1 application topically as needed. 30 g 0  . Multiple Vitamins-Minerals (MULTIVITAMIN PO) Take 1 capsule by mouth daily.    . prochlorperazine (COMPAZINE) 10 MG tablet Take 1 tablet (10 mg total) by mouth every 6 (six) hours as needed for nausea or vomiting. 30 tablet  0   No current facility-administered medications for this visit.      Allergies:  Allergies  Allergen Reactions  . Amoxicillin Rash    Has patient had a PCN reaction causing immediate rash, facial/tongue/throat swelling, SOB or lightheadedness with hypotension: No Has patient had a PCN reaction causing severe rash involving mucus membranes or skin necrosis: No Has patient had a PCN reaction that required hospitalization No Has patient  had a PCN reaction occurring within the last 10 years: Yes If all of the above answers are "NO", then may proceed with Cephalosporin use.     Past Medical History, Surgical history, Social history, and Family History were reviewed and updated.   Physical Exam:       ECOG: 1    General appearance: Comfortable appearing without any discomfort Head: Normocephalic without any trauma Oropharynx: Mucous membranes are moist and pink without any thrush or ulcers. Eyes: Pupils are equal and round reactive to light. Lymph nodes: No cervical, supraclavicular, inguinal or axillary lymphadenopathy.   Heart:regular rate and rhythm.  S1 and S2 without leg edema. Lung: Clear without any rhonchi or wheezes.  No dullness to percussion. Abdomin: Soft, nontender, nondistended with good bowel sounds.  No hepatosplenomegaly. Musculoskeletal: No joint deformity or effusion.  Full range of motion noted. Neurological: No deficits noted on motor, sensory and deep tendon reflex exam. Skin: No petechial rash or dryness.  Appeared moist.            Lab Results: Lab Results  Component Value Date   WBC 5.0 12/05/2018   HGB 8.8 (L) 12/05/2018   HCT 27.1 (L) 12/05/2018   MCV 103.8 (H) 12/05/2018   PLT 170 12/05/2018     Chemistry      Component Value Date/Time   NA 135 12/05/2018 1250   NA 139 06/07/2017 1057   K 4.2 12/05/2018 1250   K 3.6 06/07/2017 1057   CL 104 12/05/2018 1250   CO2 24 12/05/2018 1250   CO2 26 06/07/2017 1057   BUN 16 12/05/2018 1250   BUN 18.5 06/07/2017 1057   CREATININE 0.87 12/05/2018 1250   CREATININE 1.2 06/07/2017 1057      Component Value Date/Time   CALCIUM 8.3 (L) 12/05/2018 1250   CALCIUM 9.0 06/07/2017 1057   ALKPHOS 82 12/05/2018 1250   ALKPHOS 112 06/07/2017 1057   AST 15 12/05/2018 1250   AST 14 06/07/2017 1057   ALT 10 12/05/2018 1250   ALT 12 06/07/2017 1057   BILITOT 0.4 12/05/2018 1250   BILITOT 0.66 06/07/2017 1057           Results for Edward Bailey, Edward Bailey (MRN [id number]) as of 12/26/2018 11:50  Ref. Range 11/14/2018 08:07 12/05/2018 12:50  Prostate Specific Ag, Serum Latest Ref Range: 0.0 - 4.0 ng/mL 480.0 (H) 490.0 (H)      Impression and Plan:  83 year old man with  1.  Castration-resistant prostate cancer with disease to the bone diagnosed in 2017.    He has tolerated Taxotere without any major complications with reasonable clinical response.  His PSA showed clinical response initially and currently stable around 490.  Risks and benefits of of continuing this treatment long-term was reviewed.  Potential complications were reiterated.  These include fatigue, tiredness, mild suppression and neutropenia.  He is agreeable to continue at this time.  The plan is to complete 10 cycles of therapy and we discussed subsequent plan after continuing 10 cycles.  Alternative options were also reviewed and different salvage therapy were  also mentioned.      2. Androgen depravation: I recommended continuing this indefinitely and he has no objections.  He will receive that under the care of [facility].  3.  Bone directed therapy: He is receiving this under the care of [facility] I recommended continuing.   4.  Anemia: Related to his chemotherapy and malignancy.  No transfusion is needed.  5.  IV access: Port-A-Cath remains in use without any issues.  6.  Antiemetics: No nausea or vomiting reported.  Compazine is available to him.  7.  Neutropenia prophylaxis: He has high risk of developing neutropenia and neutropenic fever.  I recommended continuing Neulasta onpro after each cycle.   8.  Prognosis and goals of care: His disease is incurable but aggressive therapy is warranted given his reasonable performance status.  9. Followup: In 3 weeks for the next cycle of therapy.   25 minutes was spent with the patient face-to-face today.  More than 50% of time was dedicated to reviewing his disease  status, complications related therapy, and answering questions regarding future plan of care.   Zola Button, MD 6/5/202011:49 AM

## 2018-12-26 NOTE — Patient Instructions (Signed)

## 2018-12-27 LAB — PROSTATE-SPECIFIC AG, SERUM (LABCORP): Prostate Specific Ag, Serum: 582 ng/mL — ABNORMAL HIGH (ref 0.0–4.0)

## 2018-12-29 ENCOUNTER — Telehealth: Payer: Self-pay | Admitting: Oncology

## 2018-12-29 ENCOUNTER — Telehealth: Payer: Self-pay

## 2018-12-29 NOTE — Telephone Encounter (Signed)
Spoke to patient and let him know that per Dr. Alen Blew the PSA is 69 which is up but no changes for now. Patient verbalized understanding.

## 2018-12-29 NOTE — Telephone Encounter (Signed)
Scheduled appt per sch msg. Called and left msg.  °

## 2018-12-29 NOTE — Telephone Encounter (Signed)
-----   Message from Wyatt Portela, MD sent at 12/29/2018  8:31 AM EDT ----- Please let him know his PSA is up. No changes for now.

## 2018-12-30 DIAGNOSIS — C7951 Secondary malignant neoplasm of bone: Secondary | ICD-10-CM | POA: Diagnosis not present

## 2019-01-16 ENCOUNTER — Inpatient Hospital Stay (HOSPITAL_BASED_OUTPATIENT_CLINIC_OR_DEPARTMENT_OTHER): Payer: Medicare Other | Admitting: Oncology

## 2019-01-16 ENCOUNTER — Inpatient Hospital Stay: Payer: Medicare Other

## 2019-01-16 ENCOUNTER — Other Ambulatory Visit: Payer: Self-pay

## 2019-01-16 ENCOUNTER — Other Ambulatory Visit: Payer: Self-pay | Admitting: Oncology

## 2019-01-16 VITALS — BP 111/64 | HR 90 | Temp 98.0°F | Resp 18 | Ht 74.0 in | Wt 216.2 lb

## 2019-01-16 DIAGNOSIS — Z5111 Encounter for antineoplastic chemotherapy: Secondary | ICD-10-CM | POA: Diagnosis not present

## 2019-01-16 DIAGNOSIS — D6481 Anemia due to antineoplastic chemotherapy: Secondary | ICD-10-CM | POA: Diagnosis not present

## 2019-01-16 DIAGNOSIS — C61 Malignant neoplasm of prostate: Secondary | ICD-10-CM

## 2019-01-16 DIAGNOSIS — Z5189 Encounter for other specified aftercare: Secondary | ICD-10-CM | POA: Diagnosis not present

## 2019-01-16 DIAGNOSIS — C7951 Secondary malignant neoplasm of bone: Secondary | ICD-10-CM | POA: Diagnosis not present

## 2019-01-16 DIAGNOSIS — Z95828 Presence of other vascular implants and grafts: Secondary | ICD-10-CM

## 2019-01-16 DIAGNOSIS — H5203 Hypermetropia, bilateral: Secondary | ICD-10-CM | POA: Diagnosis not present

## 2019-01-16 LAB — CMP (CANCER CENTER ONLY)
ALT: <6 U/L (ref 0–44)
AST: 14 U/L — ABNORMAL LOW (ref 15–41)
Albumin: 3.6 g/dL (ref 3.5–5.0)
Alkaline Phosphatase: 73 U/L (ref 38–126)
Anion gap: 10 (ref 5–15)
BUN: 13 mg/dL (ref 8–23)
CO2: 20 mmol/L — ABNORMAL LOW (ref 22–32)
Calcium: 7.9 mg/dL — ABNORMAL LOW (ref 8.9–10.3)
Chloride: 105 mmol/L (ref 98–111)
Creatinine: 0.87 mg/dL (ref 0.61–1.24)
GFR, Est AFR Am: >60 mL/min (ref >60)
GFR, Estimated: >60 mL/min (ref >60)
Glucose, Bld: 107 mg/dL — ABNORMAL HIGH (ref 70–99)
Potassium: 4.1 mmol/L (ref 3.5–5.1)
Sodium: 135 mmol/L (ref 135–145)
Total Bilirubin: 0.5 mg/dL (ref 0.3–1.2)
Total Protein: 6 g/dL — ABNORMAL LOW (ref 6.5–8.1)

## 2019-01-16 LAB — CBC WITH DIFFERENTIAL (CANCER CENTER ONLY)
Abs Immature Granulocytes: 0.06 10*3/uL (ref 0.00–0.07)
Basophils Absolute: 0 10*3/uL (ref 0.0–0.1)
Basophils Relative: 1 %
Eosinophils Absolute: 0 10*3/uL (ref 0.0–0.5)
Eosinophils Relative: 1 %
HCT: 26.2 % — ABNORMAL LOW (ref 39.0–52.0)
Hemoglobin: 8.5 g/dL — ABNORMAL LOW (ref 13.0–17.0)
Immature Granulocytes: 1 %
Lymphocytes Relative: 8 %
Lymphs Abs: 0.5 10*3/uL — ABNORMAL LOW (ref 0.7–4.0)
MCH: 33.2 pg (ref 26.0–34.0)
MCHC: 32.4 g/dL (ref 30.0–36.0)
MCV: 102.3 fL — ABNORMAL HIGH (ref 80.0–100.0)
Monocytes Absolute: 0.6 10*3/uL (ref 0.1–1.0)
Monocytes Relative: 10 %
Neutro Abs: 4.5 10*3/uL (ref 1.7–7.7)
Neutrophils Relative %: 79 %
Platelet Count: 150 10*3/uL (ref 150–400)
RBC: 2.56 MIL/uL — ABNORMAL LOW (ref 4.22–5.81)
RDW: 17.3 % — ABNORMAL HIGH (ref 11.5–15.5)
WBC Count: 5.7 10*3/uL (ref 4.0–10.5)
nRBC: 0 % (ref 0.0–0.2)

## 2019-01-16 MED ORDER — SODIUM CHLORIDE 0.9 % IV SOLN
Freq: Once | INTRAVENOUS | Status: AC
  Administered 2019-01-16: 12:00:00 via INTRAVENOUS
  Filled 2019-01-16: qty 250

## 2019-01-16 MED ORDER — DEXAMETHASONE SODIUM PHOSPHATE 10 MG/ML IJ SOLN
10.0000 mg | Freq: Once | INTRAMUSCULAR | Status: AC
  Administered 2019-01-16: 10 mg via INTRAVENOUS

## 2019-01-16 MED ORDER — PEGFILGRASTIM 6 MG/0.6ML ~~LOC~~ PSKT
PREFILLED_SYRINGE | SUBCUTANEOUS | Status: AC
  Filled 2019-01-16: qty 0.6

## 2019-01-16 MED ORDER — HEPARIN SOD (PORK) LOCK FLUSH 100 UNIT/ML IV SOLN
500.0000 [IU] | Freq: Once | INTRAVENOUS | Status: AC | PRN
  Administered 2019-01-16: 500 [IU]
  Filled 2019-01-16: qty 5

## 2019-01-16 MED ORDER — SODIUM CHLORIDE 0.9% FLUSH
10.0000 mL | Freq: Once | INTRAVENOUS | Status: AC
  Administered 2019-01-16: 10 mL
  Filled 2019-01-16: qty 10

## 2019-01-16 MED ORDER — PEGFILGRASTIM 6 MG/0.6ML ~~LOC~~ PSKT
6.0000 mg | PREFILLED_SYRINGE | Freq: Once | SUBCUTANEOUS | Status: AC
  Administered 2019-01-16: 6 mg via SUBCUTANEOUS

## 2019-01-16 MED ORDER — SODIUM CHLORIDE 0.9 % IV SOLN
60.0000 mg/m2 | Freq: Once | INTRAVENOUS | Status: AC
  Administered 2019-01-16: 140 mg via INTRAVENOUS
  Filled 2019-01-16: qty 14

## 2019-01-16 MED ORDER — DEXAMETHASONE SODIUM PHOSPHATE 10 MG/ML IJ SOLN
INTRAMUSCULAR | Status: AC
  Filled 2019-01-16: qty 1

## 2019-01-16 MED ORDER — SODIUM CHLORIDE 0.9% FLUSH
10.0000 mL | INTRAVENOUS | Status: DC | PRN
  Administered 2019-01-16: 10 mL
  Filled 2019-01-16: qty 10

## 2019-01-16 NOTE — Progress Notes (Signed)
Hematology and Oncology Follow Up Visit  Edward Bailey [id number] 12/26/1935 83 y.o. 01/16/2019 11:53 AM [name], MDGriffin, [name], MD   Principle Diagnosis: 83 year old man with advanced prostate cancer with disease to the bone diagnosed in 2017.  He has castration-resistant disease after presenting with Gleason score 3+4 = 7 and a PSA of 4 in 1999.   Prior Therapy:  He is status post prostatectomy and found to have stage T3b disease. He received salvage radiation therapy for a rise in his PSA.  He developed biochemical relapse and was treated with androgen depravation intermittently between 2010 to about 2015.  He developed castration resistant disease in 2015.  Ketoconazole at 200 mg twice a day and prednisone 5 mg daily. Therapy discontinued on 05/24/2016 because of progression of disease. Zytiga 1000 mg daily with prednisone at 5 mg daily started in November 2017. Xtandi 160 mg daily started in April 2019 therapy discontinued due to progression of disease and July 2019. [name] started in July 2019.  He completed 6 months of therapy in December 2019. Xtandi 160 mg resumed in August 2019 after brief discontinuation in July 2019.  Therapy discontinued permanently in January 2020 for progression of disease.  Current therapy: Taxotere chemotherapy started on August 21, 2018 with 75 mg/m.  He is here for cycle 8 of therapy.    Interim History:  Mr. Edward Bailey presents today for a repeat evaluation.  Since last visit, he reports no major changes in his health.  He tolerated the last cycle of chemotherapy without any major complications.  He does report some fatigue and tiredness but no new complaints.  He denies any nausea, vomiting or neuropathy.  He denies any worsening diarrhea.  Continues to ambulate without any recent difficulties.  He did sustain a fall while he was sleeping and had minor trauma to his face.   Patient denied any alteration mental status, neuropathy, confusion or  dizziness.  Denies any headaches or lethargy.  Denies any night sweats, weight loss or changes in appetite.  Denied orthopnea, dyspnea on exertion or chest discomfort.  Denies shortness of breath, difficulty breathing hemoptysis or cough.  Denies any abdominal distention, nausea, early satiety or dyspepsia.  Denies any hematuria, frequency, dysuria or nocturia.  Denies any skin irritation, dryness or rash.  Denies any ecchymosis or petechiae.  Denies any lymphadenopathy or clotting.  Denies any heat or cold intolerance.  Denies any anxiety or depression.  Remaining review of system is negative.     Medications: I have reviewed the patient's current medications.  Current Outpatient Medications  Medication Sig Dispense Refill  . amLODipine (NORVASC) 5 MG tablet TK 1 T PO QD  3  . aspirin (ASPIRIN EC) 81 MG EC tablet Take 81 mg by mouth daily. Swallow whole.    Marland Kitchen azelastine (ASTELIN) 0.1 % nasal spray Place 1-2 sprays into both nostrils daily as needed. allergies  5  . cetirizine (ZYRTEC) 10 MG tablet Take 10 mg by mouth daily as needed for allergies. As needed    . fluticasone (FLONASE) 50 MCG/ACT nasal spray Place 1 spray into both nostrils daily as needed. Allergies  5  . levothyroxine (SYNTHROID, LEVOTHROID) 112 MCG tablet TK 1 T PO QD IN THE MORNING OES  12  . levothyroxine (SYNTHROID, LEVOTHROID) 125 MCG tablet Alternates with 112 mcg  9  . lidocaine-prilocaine (EMLA) cream Apply 1 application topically as needed. 30 g 0  . Multiple Vitamins-Minerals (MULTIVITAMIN PO) Take 1 capsule by mouth daily.    Marland Kitchen  prochlorperazine (COMPAZINE) 10 MG tablet Take 1 tablet (10 mg total) by mouth every 6 (six) hours as needed for nausea or vomiting. 30 tablet 0   No current facility-administered medications for this visit.      Allergies:  Allergies  Allergen Reactions  . Amoxicillin Rash    Has patient had a PCN reaction causing immediate rash, facial/tongue/throat swelling, SOB or lightheadedness  with hypotension: No Has patient had a PCN reaction causing severe rash involving mucus membranes or skin necrosis: No Has patient had a PCN reaction that required hospitalization No Has patient had a PCN reaction occurring within the last 10 years: Yes If all of the above answers are "NO", then may proceed with Cephalosporin use.     Past Medical History, Surgical history, Social history, and Family History were reviewed and updated.   Physical Exam:     Blood pressure 111/64, pulse 90, temperature 98 F (36.7 C), temperature source Oral, resp. rate 18, height 6\' 2"  (1.88 m), weight 216 lb 3.2 oz (98.1 kg), SpO2 100 %.    ECOG: 1    General appearance: Alert, awake without any distress. Head: Atraumatic without abnormalities Oropharynx: Without any thrush or ulcers. Eyes: No scleral icterus. Lymph nodes: No lymphadenopathy noted in the cervical, supraclavicular, or axillary nodes Heart:regular rate and rhythm, without any murmurs or gallops.   Lung: Clear to auscultation without any rhonchi, wheezes or dullness to percussion. Abdomin: Soft, nontender without any shifting dullness or ascites. Musculoskeletal: No clubbing or cyanosis. Neurological: No motor or sensory deficits. Skin: No rashes or lesions. Psychiatric: Mood and affect appeared normal.           Lab Results: Lab Results  Component Value Date   WBC 5.7 01/16/2019   HGB 8.5 (L) 01/16/2019   HCT 26.2 (L) 01/16/2019   MCV 102.3 (H) 01/16/2019   PLT 150 01/16/2019     Chemistry      Component Value Date/Time   NA 135 12/26/2018 1137   NA 139 06/07/2017 1057   K 4.2 12/26/2018 1137   K 3.6 06/07/2017 1057   CL 105 12/26/2018 1137   CO2 21 (L) 12/26/2018 1137   CO2 26 06/07/2017 1057   BUN 11 12/26/2018 1137   BUN 18.5 06/07/2017 1057   CREATININE 0.85 12/26/2018 1137   CREATININE 1.2 06/07/2017 1057      Component Value Date/Time   CALCIUM 8.0 (L) 12/26/2018 1137   CALCIUM 9.0  06/07/2017 1057   ALKPHOS 71 12/26/2018 1137   ALKPHOS 112 06/07/2017 1057   AST 14 (L) 12/26/2018 1137   AST 14 06/07/2017 1057   ALT 6 12/26/2018 1137   ALT 12 06/07/2017 1057   BILITOT 0.4 12/26/2018 1137   BILITOT 0.66 06/07/2017 1057       Results for TAWAN, DEGROOTE (MRN [id number]) as of 01/16/2019 11:54  Ref. Range 12/05/2018 12:50 12/26/2018 11:37  Prostate Specific Ag, Serum Latest Ref Range: 0.0 - 4.0 ng/mL 490.0 (H) 582.0 (H)        Impression and Plan:  83 year old man with  1.  Advanced prostate cancer with disease to the bone that is currently castration-resistant since 2017.   Continues to be on Taxotere chemotherapy without any major complaints.  His PSA is showing slight increase after initial decline from 713.  Complication associated with chemotherapy and alternative regimens reviewed.  After discussion today we have agreed to continue to proceed with the eighth cycle today and consideration for eye cycle in 3  weeks.  Alternative options would include Jevtana chemotherapy and supportive care.  The time being he is willing to continue.      2. Androgen depravation: He continues to receive Lupron which I recommended continuing indefinitely.  He is receiving at [facility].  3.  Bone directed therapy: He is on Xgeva and I recommended continuing at this time.  [facility] is receiving [facility] in [facility].   4.  Anemia: His hemoglobin remained stable and does not require any transfusion or intervention.  Anemia is related to chemotherapy and malignancy.  5.  IV access: Port-A-Cath currently in use without any issues.  6.  Antiemetics: Compazine is available to him without any recent nausea or vomiting.  7.  Neutropenia prophylaxis:  Neulasta onpro will continue after each cycle of chemotherapy.   8.  Prognosis and goals of care: Repeat remains palliative at this time aggressive measures are warranted given his reasonable performance status.  9. Followup:  Will be in 3 weeks for evaluation prior to his next cycle of therapy.   25 minutes was spent with the patient face-to-face today.  More than 50% of time was spent on reviewing his disease status, treatment options and answering questions regarding future plan of care.  Zola Button, MD 6/26/202011:53 AM

## 2019-01-16 NOTE — Patient Instructions (Signed)
[facility] Discharge Instructions for Patients Receiving Chemotherapy  Today you received the following chemotherapy agents: Taxotere  To help prevent nausea and vomiting after your treatment, we encourage you to take your nausea medication as directed.    If you develop nausea and vomiting that is not controlled by your nausea medication, call the clinic.   BELOW ARE SYMPTOMS THAT SHOULD BE REPORTED IMMEDIATELY:  *FEVER GREATER THAN 100.5 F  *CHILLS WITH OR WITHOUT FEVER  NAUSEA AND VOMITING THAT IS NOT CONTROLLED WITH YOUR NAUSEA MEDICATION  *UNUSUAL SHORTNESS OF BREATH  *UNUSUAL BRUISING OR BLEEDING  TENDERNESS IN MOUTH AND THROAT WITH OR WITHOUT PRESENCE OF ULCERS  *URINARY PROBLEMS  *BOWEL PROBLEMS  UNUSUAL RASH Items with * indicate a potential emergency and should be followed up as soon as possible.  Feel free to call the clinic should you have any questions or concerns. The clinic phone number is [phone].  Please show the CHEMO ALERT CARD at check-in to the Emergency Department and triage nurse.   

## 2019-01-17 LAB — PROSTATE-SPECIFIC AG, SERUM (LABCORP): Prostate Specific Ag, Serum: 667 ng/mL — ABNORMAL HIGH (ref 0.0–4.0)

## 2019-01-19 ENCOUNTER — Telehealth: Payer: Self-pay | Admitting: *Deleted

## 2019-01-19 NOTE — Telephone Encounter (Signed)
-----   Message from Wyatt Portela, MD sent at 01/19/2019 10:30 AM EDT ----- Please let him know his PSA. It is up from last time. I will address it further with him next visit.

## 2019-01-19 NOTE — Telephone Encounter (Signed)
TCT patient regarding PSA results from last week.  Spoke with patient and informed him of results. Advised that Dr. Alen Blew would discuss it with him further at his next appt in July. Pt voiced understanding and appreciated the call.

## 2019-01-27 DIAGNOSIS — C61 Malignant neoplasm of prostate: Secondary | ICD-10-CM | POA: Diagnosis not present

## 2019-01-27 DIAGNOSIS — C7951 Secondary malignant neoplasm of bone: Secondary | ICD-10-CM | POA: Diagnosis not present

## 2019-02-06 ENCOUNTER — Inpatient Hospital Stay: Payer: Medicare Other

## 2019-02-06 ENCOUNTER — Inpatient Hospital Stay: Payer: Medicare Other | Attending: Oncology | Admitting: Oncology

## 2019-02-06 ENCOUNTER — Other Ambulatory Visit: Payer: Self-pay

## 2019-02-06 VITALS — BP 124/60 | HR 82 | Temp 98.7°F | Resp 18 | Ht 74.0 in | Wt 212.8 lb

## 2019-02-06 DIAGNOSIS — C61 Malignant neoplasm of prostate: Secondary | ICD-10-CM | POA: Diagnosis not present

## 2019-02-06 DIAGNOSIS — Z5189 Encounter for other specified aftercare: Secondary | ICD-10-CM | POA: Insufficient documentation

## 2019-02-06 DIAGNOSIS — Z95828 Presence of other vascular implants and grafts: Secondary | ICD-10-CM

## 2019-02-06 DIAGNOSIS — C7951 Secondary malignant neoplasm of bone: Secondary | ICD-10-CM | POA: Diagnosis not present

## 2019-02-06 DIAGNOSIS — Z5111 Encounter for antineoplastic chemotherapy: Secondary | ICD-10-CM | POA: Insufficient documentation

## 2019-02-06 DIAGNOSIS — D630 Anemia in neoplastic disease: Secondary | ICD-10-CM | POA: Diagnosis not present

## 2019-02-06 LAB — CBC WITH DIFFERENTIAL (CANCER CENTER ONLY)
Abs Immature Granulocytes: 0.06 10*3/uL (ref 0.00–0.07)
Basophils Absolute: 0 10*3/uL (ref 0.0–0.1)
Basophils Relative: 1 %
Eosinophils Absolute: 0 10*3/uL (ref 0.0–0.5)
Eosinophils Relative: 0 %
HCT: 25.7 % — ABNORMAL LOW (ref 39.0–52.0)
Hemoglobin: 8.4 g/dL — ABNORMAL LOW (ref 13.0–17.0)
Immature Granulocytes: 1 %
Lymphocytes Relative: 8 %
Lymphs Abs: 0.4 10*3/uL — ABNORMAL LOW (ref 0.7–4.0)
MCH: 33.6 pg (ref 26.0–34.0)
MCHC: 32.7 g/dL (ref 30.0–36.0)
MCV: 102.8 fL — ABNORMAL HIGH (ref 80.0–100.0)
Monocytes Absolute: 0.4 10*3/uL (ref 0.1–1.0)
Monocytes Relative: 8 %
Neutro Abs: 4.3 10*3/uL (ref 1.7–7.7)
Neutrophils Relative %: 82 %
Platelet Count: 119 10*3/uL — ABNORMAL LOW (ref 150–400)
RBC: 2.5 MIL/uL — ABNORMAL LOW (ref 4.22–5.81)
RDW: 18.5 % — ABNORMAL HIGH (ref 11.5–15.5)
WBC Count: 5.3 10*3/uL (ref 4.0–10.5)
nRBC: 0 % (ref 0.0–0.2)

## 2019-02-06 LAB — CMP (CANCER CENTER ONLY)
ALT: 9 U/L (ref 0–44)
AST: 22 U/L (ref 15–41)
Albumin: 3.5 g/dL (ref 3.5–5.0)
Alkaline Phosphatase: 62 U/L (ref 38–126)
Anion gap: 8 (ref 5–15)
BUN: 17 mg/dL (ref 8–23)
CO2: 21 mmol/L — ABNORMAL LOW (ref 22–32)
Calcium: 7.9 mg/dL — ABNORMAL LOW (ref 8.9–10.3)
Chloride: 105 mmol/L (ref 98–111)
Creatinine: 0.83 mg/dL (ref 0.61–1.24)
GFR, Est AFR Am: >60 mL/min (ref >60)
GFR, Estimated: >60 mL/min (ref >60)
Glucose, Bld: 105 mg/dL — ABNORMAL HIGH (ref 70–99)
Potassium: 4 mmol/L (ref 3.5–5.1)
Sodium: 134 mmol/L — ABNORMAL LOW (ref 135–145)
Total Bilirubin: 0.5 mg/dL (ref 0.3–1.2)
Total Protein: 5.9 g/dL — ABNORMAL LOW (ref 6.5–8.1)

## 2019-02-06 MED ORDER — SODIUM CHLORIDE 0.9 % IV SOLN
Freq: Once | INTRAVENOUS | Status: AC
  Administered 2019-02-06: 14:00:00 via INTRAVENOUS
  Filled 2019-02-06: qty 250

## 2019-02-06 MED ORDER — HEPARIN SOD (PORK) LOCK FLUSH 100 UNIT/ML IV SOLN
500.0000 [IU] | Freq: Once | INTRAVENOUS | Status: AC | PRN
  Administered 2019-02-06: 500 [IU]
  Filled 2019-02-06: qty 5

## 2019-02-06 MED ORDER — PEGFILGRASTIM 6 MG/0.6ML ~~LOC~~ PSKT
6.0000 mg | PREFILLED_SYRINGE | Freq: Once | SUBCUTANEOUS | Status: AC
  Administered 2019-02-06: 6 mg via SUBCUTANEOUS

## 2019-02-06 MED ORDER — SODIUM CHLORIDE 0.9 % IV SOLN
60.0000 mg/m2 | Freq: Once | INTRAVENOUS | Status: AC
  Administered 2019-02-06: 15:00:00 140 mg via INTRAVENOUS
  Filled 2019-02-06: qty 14

## 2019-02-06 MED ORDER — SODIUM CHLORIDE 0.9% FLUSH
10.0000 mL | Freq: Once | INTRAVENOUS | Status: AC
  Administered 2019-02-06: 10 mL
  Filled 2019-02-06: qty 10

## 2019-02-06 MED ORDER — PEGFILGRASTIM 6 MG/0.6ML ~~LOC~~ PSKT
PREFILLED_SYRINGE | SUBCUTANEOUS | Status: AC
  Filled 2019-02-06: qty 0.6

## 2019-02-06 MED ORDER — DEXAMETHASONE SODIUM PHOSPHATE 10 MG/ML IJ SOLN
10.0000 mg | Freq: Once | INTRAMUSCULAR | Status: AC
  Administered 2019-02-06: 10 mg via INTRAVENOUS

## 2019-02-06 MED ORDER — SODIUM CHLORIDE 0.9% FLUSH
10.0000 mL | INTRAVENOUS | Status: DC | PRN
  Administered 2019-02-06: 10 mL
  Filled 2019-02-06: qty 10

## 2019-02-06 MED ORDER — DEXAMETHASONE SODIUM PHOSPHATE 10 MG/ML IJ SOLN
INTRAMUSCULAR | Status: AC
  Filled 2019-02-06: qty 1

## 2019-02-06 NOTE — Progress Notes (Signed)
Hematology and Oncology Follow Up Visit  Edward Bailey [id number] May 22, 1936 83 y.o. 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  He presented with  Gleason score 3+4 = 7 and a PSA of 4 in 1999 at the time of diagnosis.   Prior Therapy:  He is status post prostatectomy and found to have stage T3b disease. He received salvage radiation therapy for a rise in his PSA.  He developed biochemical relapse and was treated with androgen depravation intermittently between 2010 to about 2015.  He developed castration resistant disease in 2015.  Ketoconazole at 200 mg twice a day and prednisone 5 mg daily. Therapy discontinued on 05/24/2016 because of progression of disease. Zytiga 1000 mg daily with prednisone at 5 mg daily started in November 2017. Xtandi 160 mg daily started in April 2019 therapy discontinued due to progression of disease and July 2019. [name] started in July 2019.  He completed 6 months of therapy in December 2019. Xtandi 160 mg resumed in August 2019 after brief discontinuation in July 2019.  Therapy discontinued permanently in January 2020 for progression of disease.  Current therapy: Taxotere chemotherapy started on August 21, 2018 with 75 mg/m.  He is here for cycle 9 of therapy.    Interim History:  Edward Bailey is here for a follow-up.  Since last visit, he received the last cycle of chemotherapy without any major complications.  He does report some mild fatigue and tiredness but no other complaints.  He denies any peripheral neuropathy or excessive pain.  He denies any pathological fractures or falls.  His appetite remained reasonable although lost weight.  His performance status slowly declining.  He denied headaches, blurry vision, syncope or seizures.  Denies any fevers, chills or sweats.  Denied chest pain, palpitation, orthopnea or leg  edema.  Denied cough, wheezing or hemoptysis.  Denied nausea, vomiting or abdominal pain.  Denies any constipation or diarrhea.  Denies any frequency urgency or hesitancy.  Denies any arthralgias or myalgias.  Denies any skin rashes or lesions.  Denies any bleeding or clotting tendency.  Denies any easy bruising.  Denies any hair or nail changes.  Denies any anxiety or depression.  Remaining review of system is negative.        Medications: Reviewed without any changes at this time. Current Outpatient Medications  Medication Sig Dispense Refill  . amLODipine (NORVASC) 5 MG tablet TK 1 T PO QD  3  . aspirin (ASPIRIN EC) 81 MG EC tablet Take 81 mg by mouth daily. Swallow whole.    Marland Kitchen azelastine (ASTELIN) 0.1 % nasal spray Place 1-2 sprays into both nostrils daily as needed. allergies  5  . cetirizine (ZYRTEC) 10 MG tablet Take 10 mg by mouth daily as needed for allergies. As needed    . fluticasone (FLONASE) 50 MCG/ACT nasal spray Place 1 spray into both nostrils daily as needed. Allergies  5  . levothyroxine (SYNTHROID, LEVOTHROID) 112 MCG tablet TK 1 T PO QD IN THE MORNING OES  12  . levothyroxine (SYNTHROID, LEVOTHROID) 125 MCG tablet Alternates with 112 mcg  9  . lidocaine-prilocaine (EMLA) cream Apply 1 application topically as needed. 30 g 0  . Multiple Vitamins-Minerals (MULTIVITAMIN PO) Take 1 capsule by mouth daily.    . prochlorperazine (COMPAZINE) 10 MG tablet Take 1 tablet (10 mg total) by mouth every 6 (six) hours as needed for nausea  or vomiting. 30 tablet 0   No current facility-administered medications for this visit.      Allergies:  Allergies  Allergen Reactions  . Amoxicillin Rash    Has patient had a PCN reaction causing immediate rash, facial/tongue/throat swelling, SOB or lightheadedness with hypotension: No Has patient had a PCN reaction causing severe rash involving mucus membranes or skin necrosis: No Has patient had a PCN reaction that required hospitalization  No Has patient had a PCN reaction occurring within the last 10 years: Yes If all of the above answers are "NO", then may proceed with Cephalosporin use.     Past Medical History, Surgical history, Social history, and Family History reviewed without any changes.   Physical Exam:   Blood pressure 124/60, pulse 82, temperature 98.7 F (37.1 C), temperature source Oral, resp. rate 18, height 6\' 2"  (1.88 m), weight 212 lb 12.8 oz (96.5 kg), SpO2 100 %.       ECOG: 1    General appearance: Comfortable appearing without any discomfort Head: Normocephalic without any trauma Oropharynx: Mucous membranes are moist and pink without any thrush or ulcers. Eyes: Pupils are equal and round reactive to light. Lymph nodes: No cervical, supraclavicular, inguinal or axillary lymphadenopathy.   Heart:regular rate and rhythm.  S1 and S2 without leg edema. Lung: Clear without any rhonchi or wheezes.  No dullness to percussion. Abdomin: Soft, nontender, nondistended with good bowel sounds.  No hepatosplenomegaly. Musculoskeletal: No joint deformity or effusion.  Full range of motion noted. Neurological: No deficits noted on motor, sensory and deep tendon reflex exam. Skin: No petechial rash or dryness.  Appeared moist.             Lab Results: Lab Results  Component Value Date   WBC 5.7 01/16/2019   HGB 8.5 (L) 01/16/2019   HCT 26.2 (L) 01/16/2019   MCV 102.3 (H) 01/16/2019   PLT 150 01/16/2019     Chemistry      Component Value Date/Time   NA 135 01/16/2019 1129   NA 139 06/07/2017 1057   K 4.1 01/16/2019 1129   K 3.6 06/07/2017 1057   CL 105 01/16/2019 1129   CO2 20 (L) 01/16/2019 1129   CO2 26 06/07/2017 1057   BUN 13 01/16/2019 1129   BUN 18.5 06/07/2017 1057   CREATININE 0.87 01/16/2019 1129   CREATININE 1.2 06/07/2017 1057      Component Value Date/Time   CALCIUM 7.9 (L) 01/16/2019 1129   CALCIUM 9.0 06/07/2017 1057   ALKPHOS 73 01/16/2019 1129   ALKPHOS 112  06/07/2017 1057   AST 14 (L) 01/16/2019 1129   AST 14 06/07/2017 1057   ALT <6 01/16/2019 1129   ALT 12 06/07/2017 1057   BILITOT 0.5 01/16/2019 1129   BILITOT 0.66 06/07/2017 1057        Results for Edward Bailey (MRN [id number]) as of 02/06/2019 13:06  Ref. Range 12/05/2018 12:50 12/26/2018 11:37 01/16/2019 11:29  Prostate Specific Ag, Serum Latest Ref Range: 0.0 - 4.0 ng/mL 490.0 (H) 582.0 (H) 667.0 (H)        Impression and Plan:  83 year old man with  1.  Castration-resistant prostate cancer with disease to the bone noted in 2017.    He is currently on Taxotere with few complications including fatigue tiredness and a reasonable response in his PSA.  His PSA nadir down to 490 but currently is up to 667.  Risks and benefits of continuing chemotherapy to complete 10 cycles versus discontinuation after  cycle 9 was reviewed.  Repeat imaging studies will be needed to update his staging work-up.  After discussion today, we will proceed with cycle 9 and obtain staging work-up and 3 to 4 weeks and a treatment holiday after that.      2. Androgen depravation: I recommended continuing androgen deprivation indefinitely which she continues to receive at [facility].  3.  Bone directed therapy: He is currently on Xgeva under the care of [facility] which I recommended continuing.   4.  Anemia: Related to malignancy and chemotherapy.  He has not required any transfusion at this time we will continue to monitor his counts periodically.  5.  IV access: Port-A-Cath remains accessed and in use without any issues.  6.  Antiemetics: No nausea or vomiting reported at this time antiemetics are available to him.  7.  Neutropenia prophylaxis: He is at risk of developing neutropenic sepsis infection.  He will continue to receive Neulasta onpro after each cycle..   8.  Prognosis and goals of care: Therapy remains palliative although aggressive measures are warranted given his  reasonable performance status for the time being.  Overall life expectancy was discussed today as well as end-of-life care issues which is likely needs to address in the near future.  Anticipate life expectancy double-digit months probably less depending on his disease status after imaging studies.  9. Followup: We will be in 3 to 4 weeks after repeat staging work-up.   25 minutes was spent with the patient face-to-face today.  More than 50% of time was dedicated to reviewing his disease status, discussing laboratory data, alternative treatment options answering questions regarding future plan of care.  Zola Button, MD 7/17/20201:03 PM

## 2019-02-06 NOTE — Patient Instructions (Signed)
Coronavirus (COVID-19) Are you at risk?  Are you at risk for the Coronavirus (COVID-19)?  To be considered HIGH RISK for Coronavirus (COVID-19), you have to meet the following criteria:  . Traveled to [place], [place], [place], [place] or [place]; or in the [place] to [place], [place], [place], or [place]; and have fever, cough, and shortness of breath within the last 2 weeks of travel OR . Been in close contact with a person diagnosed with COVID-19 within the last 2 weeks and have fever, cough, and shortness of breath . IF YOU DO NOT MEET THESE CRITERIA, YOU ARE CONSIDERED LOW RISK FOR COVID-19.  What to do if you are HIGH RISK for COVID-19?  . If you are having a medical emergency, call 911. . Seek medical care right away. Before you go to a doctor's office, urgent care or emergency department, call ahead and tell them about your recent travel, contact with someone diagnosed with COVID-19, and your symptoms. You should receive instructions from your physician's office regarding next steps of care.  . When you arrive at healthcare provider, tell the healthcare staff immediately you have returned from visiting [place], [place], [place], [place] or [place]; or traveled in the [place] to [place], [place], [place], or [place]; in the last two weeks or you have been in close contact with a person diagnosed with COVID-19 in the last 2 weeks.   . Tell the health care staff about your symptoms: fever, cough and shortness of breath. . After you have been seen by a medical provider, you will be either: o Tested for (COVID-19) and discharged home on quarantine except to seek medical care if symptoms worsen, and asked to  - Stay home and avoid contact with others until you get your results (4-5 days)  - Avoid travel on public transportation if possible (such as bus, train, or airplane) or o Sent to the Emergency Department by EMS for evaluation, COVID-19 testing, and possible  admission depending on your condition and test results.  What to do if you are LOW RISK for COVID-19?  Reduce your risk of any infection by using the same precautions used for avoiding the common cold or flu:  . Wash your hands often with soap and warm water for at least 20 seconds.  If soap and water are not readily available, use an alcohol-based hand sanitizer with at least 60% alcohol.  . If coughing or sneezing, cover your mouth and nose by coughing or sneezing into the elbow areas of your shirt or coat, into a tissue or into your sleeve (not your hands). . Avoid shaking hands with others and consider head nods or verbal greetings only. . Avoid touching your eyes, nose, or mouth with unwashed hands.  . Avoid close contact with people who are sick. . Avoid places or events with large numbers of people in one location, like concerts or sporting events. . Carefully consider travel plans you have or are making. . If you are planning any travel outside or inside the [place], visit the CDC's Travelers' Health webpage for the latest health notices. . If you have some symptoms but not all symptoms, continue to monitor at home and seek medical attention if your symptoms worsen. . If you are having a medical emergency, call 911.   ADDITIONAL HEALTHCARE OPTIONS FOR PATIENTS  [facility] / e-Visit: [email or web address]         [facility]: [phone]  Onset   Urgent Care: [place] [facility]: [place] Discharge Instructions for Patients Receiving Chemotherapy  Today you received the following chemotherapy agents Taxotere  To help prevent nausea and vomiting after your treatment, we encourage you to take your nausea medication as directed.    If you develop nausea and vomiting that is not controlled by your nausea medication, call the clinic.   BELOW ARE  SYMPTOMS THAT SHOULD BE REPORTED IMMEDIATELY:  *FEVER GREATER THAN 100.5 F  *CHILLS WITH OR WITHOUT FEVER  NAUSEA AND VOMITING THAT IS NOT CONTROLLED WITH YOUR NAUSEA MEDICATION  *UNUSUAL SHORTNESS OF BREATH  *UNUSUAL BRUISING OR BLEEDING  TENDERNESS IN MOUTH AND THROAT WITH OR WITHOUT PRESENCE OF ULCERS  *URINARY PROBLEMS  *BOWEL PROBLEMS  UNUSUAL RASH Items with * indicate a potential emergency and should be followed up as soon as possible.  Feel free to call the clinic should you have any questions or concerns. The clinic phone number is [phone].  Please show the [place] at check-in to the Emergency Department and triage nurse.

## 2019-02-07 LAB — PROSTATE-SPECIFIC AG, SERUM (LABCORP): Prostate Specific Ag, Serum: 661 ng/mL — ABNORMAL HIGH (ref 0.0–4.0)

## 2019-02-09 ENCOUNTER — Telehealth: Payer: Self-pay

## 2019-02-09 NOTE — Telephone Encounter (Signed)
-----   Message from Wyatt Portela, MD sent at 02/09/2019  8:21 AM EDT ----- Please let him know his PSA is down some.

## 2019-02-09 NOTE — Telephone Encounter (Signed)
Contacted patient and made aware of PSA results. 

## 2019-02-11 ENCOUNTER — Other Ambulatory Visit: Payer: Self-pay

## 2019-02-11 ENCOUNTER — Ambulatory Visit (INDEPENDENT_AMBULATORY_CARE_PROVIDER_SITE_OTHER): Payer: Medicare Other | Admitting: Podiatry

## 2019-02-11 ENCOUNTER — Encounter: Payer: Self-pay | Admitting: Podiatry

## 2019-02-11 VITALS — Temp 97.9°F

## 2019-02-11 DIAGNOSIS — B351 Tinea unguium: Secondary | ICD-10-CM

## 2019-02-11 DIAGNOSIS — M79675 Pain in left toe(s): Secondary | ICD-10-CM

## 2019-02-11 DIAGNOSIS — M79674 Pain in right toe(s): Secondary | ICD-10-CM

## 2019-02-11 DIAGNOSIS — G629 Polyneuropathy, unspecified: Secondary | ICD-10-CM

## 2019-02-11 NOTE — Progress Notes (Signed)
Complaint:  Visit Type: Patient presents  to my office for  preventative foot care services. Complaint: Patient states" my nails have grown long and thick and become painful to walk and wear shoes" Patient is under going chemo... The patient presents for preventative foot care services.   Podiatric Exam: Vascular: dorsalis pedis and posterior tibial pulses are palpable bilateral. Capillary return is immediate. Temperature gradient is WNL. Skin turgor WNL  Sensorium: Diminished Semmes Weinstein monofilament test. Normal tactile sensation bilaterally. Nail Exam: Pt has thick disfigured discolored nails with subungual debris noted bilateral entire nail hallux through fifth toenails Ulcer Exam: There is no evidence of ulcer or pre-ulcerative changes or infection. Orthopedic Exam: Muscle tone and strength are WNL. No limitations in general ROM. No crepitus or effusions noted. Foot type and digits show no abnormalities. Bony prominences are unremarkable. Skin: No Porokeratosis. No infection or ulcers  Diagnosis:  Onychomycosis, , Pain in right toe, pain in left toes  Treatment & Plan Procedures and Treatment: Consent by patient was obtained for treatment procedures.   Debridement of mycotic and hypertrophic toenails, 1 through 5 bilateral and clearing of subungual debris. No ulceration, no infection noted. Patient desires diabetic socks. 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 Return Visit-Office Procedure: Patient instructed to return to the office for a follow up visit 3 months for continued evaluation and treatment.    Gardiner Barefoot DPM

## 2019-02-19 ENCOUNTER — Telehealth: Payer: Self-pay

## 2019-02-19 NOTE — Telephone Encounter (Signed)
-----   Message from Wyatt Portela, MD sent at 02/19/2019  2:05 PM EDT ----- Regarding: RE: patient concerns No intervention is needed.  Just ask him to monitor and will evaluate him next week.  Thanks ----- Message ----- From: Scot Dock, RN Sent: 02/19/2019   1:52 PM EDT To: Wyatt Portela, MD Subject: patient concerns                               He stated that he has numbness and tingling of his "jaw" from his nose to his chin for the past 2 weeks, has not gotten worse or better. He can speak clearly and has not impacted intake. He has no other symptoms - no SOB, CP, etc, no pain. He has scans scheduled on 8/4 and then MD follow up on 8/6.

## 2019-02-19 NOTE — Telephone Encounter (Signed)
Called patient back and made him aware that Dr. Alen Blew will evaluate him and address concerns on the 8/6 MD visit. Patient verbalized understanding and was agreeable with the plan.

## 2019-02-20 ENCOUNTER — Telehealth: Payer: Self-pay | Admitting: Podiatry

## 2019-02-20 NOTE — Telephone Encounter (Signed)
Pt's wife is requesting the diabetic socks for her husband. She is requesting the low length and the size is 12 1/2. Can you please bring some to the [place] office from [place] office on Tuesday. Thank you.

## 2019-02-23 NOTE — Telephone Encounter (Signed)
I'm needing a size 12 1/2 in the mini crew diabetic socks. Please bring to [place].

## 2019-02-23 NOTE — Telephone Encounter (Signed)
Per Dr. Prudence Davidson, diabetic socks were given to [facility] on Friday(08/02//2020)

## 2019-02-24 ENCOUNTER — Encounter (HOSPITAL_COMMUNITY): Payer: Self-pay

## 2019-02-24 ENCOUNTER — Ambulatory Visit (HOSPITAL_COMMUNITY)
Admission: RE | Admit: 2019-02-24 | Discharge: 2019-02-24 | Disposition: A | Discharge status: Home / Self Care | Payer: Medicare Other | Source: Ambulatory Visit | Attending: Oncology | Admitting: Oncology

## 2019-02-24 ENCOUNTER — Inpatient Hospital Stay: Payer: Medicare Other | Attending: Oncology

## 2019-02-24 ENCOUNTER — Other Ambulatory Visit: Payer: Self-pay

## 2019-02-24 ENCOUNTER — Telehealth: Payer: Self-pay

## 2019-02-24 ENCOUNTER — Inpatient Hospital Stay: Payer: Medicare Other

## 2019-02-24 ENCOUNTER — Encounter (HOSPITAL_COMMUNITY)
Admission: RE | Admit: 2019-02-24 | Discharge: 2019-02-24 | Disposition: A | Discharge status: Home / Self Care | Payer: Medicare Other | Source: Ambulatory Visit | Attending: Oncology | Admitting: Oncology

## 2019-02-24 DIAGNOSIS — C61 Malignant neoplasm of prostate: Secondary | ICD-10-CM

## 2019-02-24 DIAGNOSIS — R5383 Other fatigue: Secondary | ICD-10-CM | POA: Insufficient documentation

## 2019-02-24 DIAGNOSIS — D630 Anemia in neoplastic disease: Secondary | ICD-10-CM | POA: Insufficient documentation

## 2019-02-24 DIAGNOSIS — Z9079 Acquired absence of other genital organ(s): Secondary | ICD-10-CM | POA: Insufficient documentation

## 2019-02-24 DIAGNOSIS — Z923 Personal history of irradiation: Secondary | ICD-10-CM | POA: Diagnosis not present

## 2019-02-24 DIAGNOSIS — G629 Polyneuropathy, unspecified: Secondary | ICD-10-CM | POA: Diagnosis not present

## 2019-02-24 DIAGNOSIS — Z7982 Long term (current) use of aspirin: Secondary | ICD-10-CM | POA: Diagnosis not present

## 2019-02-24 DIAGNOSIS — Z9221 Personal history of antineoplastic chemotherapy: Secondary | ICD-10-CM | POA: Insufficient documentation

## 2019-02-24 DIAGNOSIS — Z79899 Other long term (current) drug therapy: Secondary | ICD-10-CM | POA: Insufficient documentation

## 2019-02-24 DIAGNOSIS — Z95828 Presence of other vascular implants and grafts: Secondary | ICD-10-CM

## 2019-02-24 DIAGNOSIS — C7951 Secondary malignant neoplasm of bone: Secondary | ICD-10-CM | POA: Diagnosis not present

## 2019-02-24 DIAGNOSIS — Z8546 Personal history of malignant neoplasm of prostate: Secondary | ICD-10-CM | POA: Diagnosis not present

## 2019-02-24 LAB — CMP (CANCER CENTER ONLY)
ALT: 8 U/L (ref 0–44)
AST: 28 U/L (ref 15–41)
Albumin: 3.6 g/dL (ref 3.5–5.0)
Alkaline Phosphatase: 56 U/L (ref 38–126)
Anion gap: 8 (ref 5–15)
BUN: 15 mg/dL (ref 8–23)
CO2: 21 mmol/L — ABNORMAL LOW (ref 22–32)
Calcium: 8.8 mg/dL — ABNORMAL LOW (ref 8.9–10.3)
Chloride: 104 mmol/L (ref 98–111)
Creatinine: 0.89 mg/dL (ref 0.61–1.24)
GFR, Est AFR Am: >60 mL/min (ref >60)
GFR, Estimated: >60 mL/min (ref >60)
Glucose, Bld: 104 mg/dL — ABNORMAL HIGH (ref 70–99)
Potassium: 4.4 mmol/L (ref 3.5–5.1)
Sodium: 133 mmol/L — ABNORMAL LOW (ref 135–145)
Total Bilirubin: 0.4 mg/dL (ref 0.3–1.2)
Total Protein: 5.9 g/dL — ABNORMAL LOW (ref 6.5–8.1)

## 2019-02-24 LAB — CBC WITH DIFFERENTIAL (CANCER CENTER ONLY)
Abs Immature Granulocytes: 0.18 10*3/uL — ABNORMAL HIGH (ref 0.00–0.07)
Basophils Absolute: 0 10*3/uL (ref 0.0–0.1)
Basophils Relative: 1 %
Eosinophils Absolute: 0 10*3/uL (ref 0.0–0.5)
Eosinophils Relative: 1 %
HCT: 25.2 % — ABNORMAL LOW (ref 39.0–52.0)
Hemoglobin: 8.1 g/dL — ABNORMAL LOW (ref 13.0–17.0)
Immature Granulocytes: 4 %
Lymphocytes Relative: 9 %
Lymphs Abs: 0.4 10*3/uL — ABNORMAL LOW (ref 0.7–4.0)
MCH: 33.5 pg (ref 26.0–34.0)
MCHC: 32.1 g/dL (ref 30.0–36.0)
MCV: 104.1 fL — ABNORMAL HIGH (ref 80.0–100.0)
Monocytes Absolute: 0.3 10*3/uL (ref 0.1–1.0)
Monocytes Relative: 8 %
Neutro Abs: 3.4 10*3/uL (ref 1.7–7.7)
Neutrophils Relative %: 77 %
Platelet Count: 105 10*3/uL — ABNORMAL LOW (ref 150–400)
RBC: 2.42 MIL/uL — ABNORMAL LOW (ref 4.22–5.81)
RDW: 18.8 % — ABNORMAL HIGH (ref 11.5–15.5)
WBC Count: 4.3 10*3/uL (ref 4.0–10.5)
nRBC: 0 % (ref 0.0–0.2)

## 2019-02-24 MED ORDER — SODIUM CHLORIDE 0.9% FLUSH
10.0000 mL | Freq: Once | INTRAVENOUS | Status: AC
  Administered 2019-02-24: 10 mL
  Filled 2019-02-24: qty 10

## 2019-02-24 MED ORDER — IOHEXOL 300 MG/ML  SOLN
100.0000 mL | Freq: Once | INTRAMUSCULAR | Status: AC | PRN
  Administered 2019-02-24: 14:00:00 100 mL via INTRAVENOUS

## 2019-02-24 MED ORDER — SODIUM CHLORIDE (PF) 0.9 % IJ SOLN
INTRAMUSCULAR | Status: AC
  Filled 2019-02-24: qty 50

## 2019-02-24 MED ORDER — HEPARIN SOD (PORK) LOCK FLUSH 100 UNIT/ML IV SOLN
500.0000 [IU] | Freq: Once | INTRAVENOUS | Status: AC
  Administered 2019-02-24: 14:00:00 500 [IU] via INTRAVENOUS

## 2019-02-24 MED ORDER — HEPARIN SOD (PORK) LOCK FLUSH 100 UNIT/ML IV SOLN
INTRAVENOUS | Status: AC
  Administered 2019-02-24: 14:00:00 500 [IU] via INTRAVENOUS
  Filled 2019-02-24: qty 5

## 2019-02-24 MED ORDER — IOHEXOL 300 MG/ML  SOLN
30.0000 mL | Freq: Once | INTRAMUSCULAR | Status: AC | PRN
  Administered 2019-02-24: 12:00:00 30 mL via ORAL

## 2019-02-24 MED ORDER — TECHNETIUM TC 99M MEDRONATE IV KIT
21.0000 | PACK | Freq: Once | INTRAVENOUS | Status: AC | PRN
  Administered 2019-02-24: 21 via INTRAVENOUS

## 2019-02-24 NOTE — Telephone Encounter (Signed)
Called pt's wife to let her know that socks for her husband will be at the check in/check out desk in our [place] office for her to pick up when she is able to.

## 2019-02-24 NOTE — Patient Instructions (Signed)

## 2019-02-24 NOTE — Telephone Encounter (Signed)
Received a call from patient spouse requesting that the follow up MD visit on 8/6 be a phone visit instead of face to face. Per Dr. Alen Blew okay to change visit to phone visit. Patient and spouse made aware and visit notes updated.

## 2019-02-25 LAB — PROSTATE-SPECIFIC AG, SERUM (LABCORP): Prostate Specific Ag, Serum: 664 ng/mL — ABNORMAL HIGH (ref 0.0–4.0)

## 2019-02-26 ENCOUNTER — Inpatient Hospital Stay (HOSPITAL_BASED_OUTPATIENT_CLINIC_OR_DEPARTMENT_OTHER): Payer: Medicare Other | Admitting: Oncology

## 2019-02-26 DIAGNOSIS — C7951 Secondary malignant neoplasm of bone: Secondary | ICD-10-CM | POA: Diagnosis not present

## 2019-02-26 DIAGNOSIS — C61 Malignant neoplasm of prostate: Secondary | ICD-10-CM | POA: Diagnosis not present

## 2019-02-26 NOTE — Progress Notes (Signed)
Hematology and Oncology Follow Up for Telemedicine Visits  Edward Bailey [id number] 10-25-1935 83 y.o. 02/26/2019 12:32 PM Edward Bailey, MDGriffin, Edward Reichmann, MD   I connected with Edward Bailey good on 02/26/19 at 11:45 AM EDT by telephone visit and verified that I am speaking with the correct person using two identifiers.  Attempts to enable face-to-face video visit has failed due to technology failure.   I discussed the limitations, risks, security and privacy concerns of performing an evaluation and management service by telemedicine and the availability of in-person appointments. I also discussed with the patient that there may be a patient responsible charge related to this service. The patient expressed understanding and agreed to proceed.  Other persons participating in the visit and their role in the encounter: [name]  Patient's location: Home Provider's location: Office    Principle Diagnosis: 83 year old with advanced Prostate cancer and disease to the bone that is currently castration resistant.  His was diagnosed in 2017 with initial diagnosis of Gleason score 3+4 = 7 in 1999.   Prior Therapy: He is status post prostatectomy and found to have stage T3b disease. He received salvage radiation therapy for a rise in his PSA.  He developed biochemical relapse and was treated with androgen depravation intermittently between 2010 to about 2015.  He developed castration resistant disease in 2015.  Ketoconazole at 200 mg twice a day and prednisone 5 mg daily. Therapy discontinued on 05/24/2016 because of progression of disease. Zytiga 1000 mg daily with prednisone at 5 mg daily started in November 2017. Xtandi 160 mg daily started in April 2019 therapy discontinued due to progression of disease and July 2019. [name] started in July 2019.  He completed 6 months of therapy in December 2019. Xtandi 160 mg resumed in August 2019 after brief discontinuation in July 2019.  Therapy  discontinued permanently in January 2020 for progression of disease. Taxotere chemotherapy started on August 21, 2018 and receiving 75 mg per metered square.  He completed 9 cycles of therapy on February 06, 2019.  Current therapy: Treatment break.  Interim History: Mr. Edward Bailey reports no major changes in his health.  He continues to recover slowly from his last cycle of chemotherapy with slow improvement in his fatigue.  He remains quite fatigued however.  He does report some numbness around his lips although he denies any drooling or dysphasia.  He denies any peripheral neuropathy in his upper or lower extremities.  His appetite and performance status remains reasonable.    Medications: updated today on review. Current Outpatient Medications  Medication Sig Dispense Refill  . amLODipine (NORVASC) 5 MG tablet TK 1 T PO QD  3  . aspirin (ASPIRIN EC) 81 MG EC tablet Take 81 mg by mouth daily. Swallow whole.    Marland Kitchen azelastine (ASTELIN) 0.1 % nasal spray Place 1-2 sprays into both nostrils daily as needed. allergies  5  . cetirizine (ZYRTEC) 10 MG tablet Take 10 mg by mouth daily as needed for allergies. As needed    . fluticasone (FLONASE) 50 MCG/ACT nasal spray Place 1 spray into both nostrils daily as needed. Allergies  5  . levothyroxine (SYNTHROID, LEVOTHROID) 112 MCG tablet TK 1 T PO QD IN THE MORNING OES  12  . levothyroxine (SYNTHROID, LEVOTHROID) 125 MCG tablet Alternates with 112 mcg  9  . lidocaine-prilocaine (EMLA) cream Apply 1 application topically as needed. 30 g 0  . Multiple Vitamins-Minerals (MULTIVITAMIN PO) Take 1 capsule by mouth daily.    Marland Kitchen  prochlorperazine (COMPAZINE) 10 MG tablet Take 1 tablet (10 mg total) by mouth every 6 (six) hours as needed for nausea or vomiting. 30 tablet 0   No current facility-administered medications for this visit.      Allergies:  Allergies  Allergen Reactions  . Amoxicillin Rash    Has patient had a PCN reaction causing immediate rash,  facial/tongue/throat swelling, SOB or lightheadedness with hypotension: No Has patient had a PCN reaction causing severe rash involving mucus membranes or skin necrosis: No Has patient had a PCN reaction that required hospitalization No Has patient had a PCN reaction occurring within the last 10 years: Yes If all of the above answers are "NO", then may proceed with Cephalosporin use.     Past Medical History, Surgical history, Social history, and Family History updated today without any changes.    Lab Results: Lab Results  Component Value Date   WBC 4.3 02/24/2019   HGB 8.1 (L) 02/24/2019   HCT 25.2 (L) 02/24/2019   MCV 104.1 (H) 02/24/2019   PLT 105 (L) 02/24/2019     Chemistry      Component Value Date/Time   NA 133 (L) 02/24/2019 1028   NA 139 06/07/2017 1057   K 4.4 02/24/2019 1028   K 3.6 06/07/2017 1057   CL 104 02/24/2019 1028   CO2 21 (L) 02/24/2019 1028   CO2 26 06/07/2017 1057   BUN 15 02/24/2019 1028   BUN 18.5 06/07/2017 1057   CREATININE 0.89 02/24/2019 1028   CREATININE 1.2 06/07/2017 1057      Component Value Date/Time   CALCIUM 8.8 (L) 02/24/2019 1028   CALCIUM 9.0 06/07/2017 1057   ALKPHOS 56 02/24/2019 1028   ALKPHOS 112 06/07/2017 1057   AST 28 02/24/2019 1028   AST 14 06/07/2017 1057   ALT 8 02/24/2019 1028   ALT 12 06/07/2017 1057   BILITOT 0.4 02/24/2019 1028   BILITOT 0.66 06/07/2017 1057       Radiological Studies: Nm Bone Scan Whole Body  Result Date: 02/24/2019 CLINICAL DATA:  Metastatic prostate cancer. EXAM: NUCLEAR MEDICINE WHOLE BODY BONE SCAN TECHNIQUE: Whole body anterior and posterior images were obtained approximately 3 hours after intravenous injection of radiopharmaceutical. RADIOPHARMACEUTICALS:  Twenty-one mCi Technetium-48m MDP IV COMPARISON:  August 05, 2018 FINDINGS: Foci of increased radiotracer uptake are seen throughout the bilateral anterior and posterior ribs, bilateral clavicles, sternum, lumbar spine, bilateral  humeri, calvarium.Compared to the prior study however, there is less prominent abnormal radiotracer activity within the axial skeleton. IMPRESSION: Compared to the prior study, there is less prominent abnormal radiotracer activity throughout the axial skeleton. Electronically Signed   By: Fidela Salisbury M.D.   On: 02/24/2019 20:14   Ct Abdomen Pelvis W Contrast  Result Date: 02/24/2019 CLINICAL DATA:  History of metastatic prostate cancer. Elevated PSA. EXAM: CT ABDOMEN AND PELVIS WITH CONTRAST TECHNIQUE: Multidetector CT imaging of the abdomen and pelvis was performed using the standard protocol following bolus administration of intravenous contrast. CONTRAST:  152mL OMNIPAQUE IOHEXOL 300 MG/ML  SOLN COMPARISON:  CT scan 08/05/2018 FINDINGS: Lower chest: No acute pulmonary findings. No worrisome pulmonary nodules. The heart is within normal limits in size for age. No pericardial effusion. Stable coronary artery and aortic calcifications. Hepatobiliary: No focal hepatic lesions or intrahepatic biliary dilatation. The gallbladder is normal. No common bile duct dilatation. The portal and hepatic veins are normal. Pancreas: No mass, inflammation or ductal dilatation. Spleen: Normal size.  No focal lesions. Adrenals/Urinary Tract: The adrenal glands  and kidneys are unremarkable and stable. The bladder is normal. Stomach/Bowel: The stomach, duodenum, small bowel and colon are unremarkable. No acute inflammatory changes, mass lesions or obstructive findings. Vascular/Lymphatic: Stable atherosclerotic calcifications involving the aorta and iliac arteries and branch vessels but no aneurysm. The major venous structures are patent. No mesenteric or retroperitoneal mass or adenopathy. No pelvic adenopathy. No inguinal adenopathy. Reproductive: Status post prostatectomy and pelvic lymph node dissection. Other: No pelvic mass or free pelvic fluid collections. No inguinal mass or adenopathy. Musculoskeletal: Definite  interval increase and metastatic lesions involving the spine and pelvis. Stable large lesion in the right pedicle at T12 with there is a new or enlarging sclerotic lesion in the right side of the vertebral body anteriorly on image number 29 which measures 11.5 mm. Enlarging T12 lesion in the left aspect of the vertebral body anteriorly. This measures 16.5 mm on image number 36 and previously measured 11 mm. Enlarging lesion in the left aspect of the L5 vertebral body which measures 17.5 mm and previously measured 10 mm. Smaller lesions anteriorly in the vertebral body are also slightly larger. Sclerotic lesion in the right iliac bone on image number 82 measures 19.5 x 13 mm and previously measured 18 x 11 mm. 13 x 10.5 mm lesion in the left ischial tuberosity region previously measured 10.5 x 6 mm. IMPRESSION: 1. Progressive osseous metastatic lesions as detailed above. No pathologic fracture or canal compromise. 2. Status post prostatectomy. No evidence for recurrent tumor in the pelvis and no adenopathy in the abdomen. 3. No acute abdominal/pelvic findings. Electronically Signed   By: Marijo Sanes M.D.   On: 02/24/2019 15:47     Impression and Plan:  83 year old man with  1.    Advanced prostate cancer with disease to the bone that is currently castration-resistant since 2017.   He completed 9 cycles of Taxotere chemotherapy and opted for a treatment break given accumulation of toxicity.  His laboratory testing as well as CT scan and bone scan obtained on 02/24/2019 were personally reviewed and discussed with the patient today and his wife.  He appears to have PSA stabilization although there is some improvement in his bone disease by bone scan criteria.  The natural course of this disease as well as treatment options were reviewed.  At this time he has exhausted most treatment options at this time and any future treatment options are limited.  I recommended a period of observation and surveillance  for the time being and supportive care.  Repeat chemotherapy or PARP better would be an option if he harbors appropriate mutation.  He is agreeable to proceed with this plan we will continue to monitor him closely.      2.  Anemia: His hemoglobin is declined slightly related to chemotherapy and anticipate improvement moving forward.  No transfusion is needed at this time.   3.  IV access: Port-A-Cath will be flushed periodically.   4.  Prognosis and goals of care: This was reiterated again and discussed with the patient and his wife.  His disease is incurable and has received multiple therapies.  I anticipate he has limited life expectancy although his performance status remains adequate and my desire aggressive measures.  5. Followup:  In 4 weeks for repeat evaluation.    I discussed the assessment and treatment plan with the patient. The patient was provided an opportunity to ask questions and all were answered. The patient agreed with the plan and demonstrated an understanding of the instructions.  The patient was advised to call back or seek an in-person evaluation if the symptoms worsen or if the condition fails to improve as anticipated.  I provided 25 minutes of face-to-face video visit time that has failed due to technical difficulties and converted into a phone call.  During this encounter, and > 50% was dedicated to reviewing his disease status, reviewing imaging studies, treatment options and complications related to his current therapy.  Zola Button, MD 02/26/2019 12:32 PM

## 2019-02-27 ENCOUNTER — Telehealth: Payer: Self-pay | Admitting: Oncology

## 2019-02-27 NOTE — Telephone Encounter (Signed)
Called per 8/06 sch message - unable to reach pt or leave message - mailed appt with appt date and time

## 2019-03-04 DIAGNOSIS — C7951 Secondary malignant neoplasm of bone: Secondary | ICD-10-CM | POA: Diagnosis not present

## 2019-03-04 DIAGNOSIS — C61 Malignant neoplasm of prostate: Secondary | ICD-10-CM | POA: Diagnosis not present

## 2019-03-10 ENCOUNTER — Telehealth: Payer: Self-pay

## 2019-03-10 NOTE — Telephone Encounter (Signed)
Called patient and made him aware that Dr. Alen Blew can see him Monday 8/24 at 9:30. Verbalized understanding and scheduling message sent.

## 2019-03-10 NOTE — Telephone Encounter (Signed)
-----   Message from Wyatt Portela, MD sent at 03/10/2019 12:32 PM EDT ----- I can see him on 8/24 at 9:30 without labs. Thanks.  ----- Message ----- From: Tami Lin, RN Sent: 03/10/2019  11:14 AM EDT To: Wyatt Portela, MD  Mr. Pennisi wants to know if you can see him sooner than 04/02/19. He said he is having "new pain near the bottom of the spine" and he wants to discuss Bone scan and CT scan results. He is also requesting an in person visit instead of phone.  Lanelle Bal

## 2019-03-16 ENCOUNTER — Inpatient Hospital Stay: Payer: Medicare Other

## 2019-03-16 ENCOUNTER — Inpatient Hospital Stay (HOSPITAL_BASED_OUTPATIENT_CLINIC_OR_DEPARTMENT_OTHER): Payer: Medicare Other | Admitting: Oncology

## 2019-03-16 ENCOUNTER — Other Ambulatory Visit: Payer: Self-pay

## 2019-03-16 VITALS — BP 125/58 | HR 109 | Temp 98.3°F | Resp 18 | Ht 74.0 in | Wt 202.6 lb

## 2019-03-16 DIAGNOSIS — Z923 Personal history of irradiation: Secondary | ICD-10-CM | POA: Diagnosis not present

## 2019-03-16 DIAGNOSIS — D630 Anemia in neoplastic disease: Secondary | ICD-10-CM | POA: Diagnosis not present

## 2019-03-16 DIAGNOSIS — C61 Malignant neoplasm of prostate: Secondary | ICD-10-CM

## 2019-03-16 DIAGNOSIS — G629 Polyneuropathy, unspecified: Secondary | ICD-10-CM | POA: Diagnosis not present

## 2019-03-16 DIAGNOSIS — C7951 Secondary malignant neoplasm of bone: Secondary | ICD-10-CM

## 2019-03-16 DIAGNOSIS — Z9079 Acquired absence of other genital organ(s): Secondary | ICD-10-CM | POA: Diagnosis not present

## 2019-03-16 DIAGNOSIS — R5383 Other fatigue: Secondary | ICD-10-CM | POA: Diagnosis not present

## 2019-03-16 DIAGNOSIS — Z79899 Other long term (current) drug therapy: Secondary | ICD-10-CM | POA: Diagnosis not present

## 2019-03-16 DIAGNOSIS — Z7982 Long term (current) use of aspirin: Secondary | ICD-10-CM | POA: Diagnosis not present

## 2019-03-16 DIAGNOSIS — Z9221 Personal history of antineoplastic chemotherapy: Secondary | ICD-10-CM | POA: Diagnosis not present

## 2019-03-16 LAB — CBC WITH DIFFERENTIAL (CANCER CENTER ONLY)
Abs Immature Granulocytes: 0.05 10*3/uL (ref 0.00–0.07)
Basophils Absolute: 0 10*3/uL (ref 0.0–0.1)
Basophils Relative: 0 %
Eosinophils Absolute: 0.1 10*3/uL (ref 0.0–0.5)
Eosinophils Relative: 2 %
HCT: 24.4 % — ABNORMAL LOW (ref 39.0–52.0)
Hemoglobin: 7.9 g/dL — ABNORMAL LOW (ref 13.0–17.0)
Immature Granulocytes: 1 %
Lymphocytes Relative: 7 %
Lymphs Abs: 0.3 10*3/uL — ABNORMAL LOW (ref 0.7–4.0)
MCH: 32.2 pg (ref 26.0–34.0)
MCHC: 32.4 g/dL (ref 30.0–36.0)
MCV: 99.6 fL (ref 80.0–100.0)
Monocytes Absolute: 0.3 10*3/uL (ref 0.1–1.0)
Monocytes Relative: 7 %
Neutro Abs: 3.7 10*3/uL (ref 1.7–7.7)
Neutrophils Relative %: 83 %
Platelet Count: 148 10*3/uL — ABNORMAL LOW (ref 150–400)
RBC: 2.45 MIL/uL — ABNORMAL LOW (ref 4.22–5.81)
RDW: 17.4 % — ABNORMAL HIGH (ref 11.5–15.5)
WBC Count: 4.5 10*3/uL (ref 4.0–10.5)
nRBC: 0 % (ref 0.0–0.2)

## 2019-03-16 LAB — CMP (CANCER CENTER ONLY)
ALT: 8 U/L (ref 0–44)
AST: 38 U/L (ref 15–41)
Albumin: 3.4 g/dL — ABNORMAL LOW (ref 3.5–5.0)
Alkaline Phosphatase: 70 U/L (ref 38–126)
Anion gap: 11 (ref 5–15)
BUN: 16 mg/dL (ref 8–23)
CO2: 21 mmol/L — ABNORMAL LOW (ref 22–32)
Calcium: 8.6 mg/dL — ABNORMAL LOW (ref 8.9–10.3)
Chloride: 102 mmol/L (ref 98–111)
Creatinine: 1 mg/dL (ref 0.61–1.24)
GFR, Est AFR Am: >60 mL/min (ref >60)
GFR, Estimated: >60 mL/min (ref >60)
Glucose, Bld: 100 mg/dL — ABNORMAL HIGH (ref 70–99)
Potassium: 4.2 mmol/L (ref 3.5–5.1)
Sodium: 134 mmol/L — ABNORMAL LOW (ref 135–145)
Total Bilirubin: 0.4 mg/dL (ref 0.3–1.2)
Total Protein: 6.2 g/dL — ABNORMAL LOW (ref 6.5–8.1)

## 2019-03-16 LAB — IRON AND TIBC
Iron: 37 ug/dL — ABNORMAL LOW (ref 42–163)
Saturation Ratios: 16 % — ABNORMAL LOW (ref 20–55)
TIBC: 224 ug/dL (ref 202–409)
UIBC: 187 ug/dL (ref 117–376)

## 2019-03-16 LAB — FERRITIN: Ferritin: 1209 ng/mL — ABNORMAL HIGH (ref 24–336)

## 2019-03-16 NOTE — Progress Notes (Signed)
Hematology and Oncology Follow Up Visit  Edward Bailey [id number] 1935-12-23 83 y.o. 03/16/2019 9:30 AM Edward Bailey, MDGriffin, Edward Reichmann, MD   Principle Diagnosis: 83 year old man with advanced prostate cancer with disease to the bone diagnosed in 2017.  He has castration-resistant after diagnosed with Gleason score 3+4 = 7 and a PSA of 4 in 1999.   Prior Therapy:  He is status post prostatectomy and found to have stage T3b disease. He received salvage radiation therapy for a rise in his PSA.  He developed biochemical relapse and was treated with androgen depravation intermittently between 2010 to about 2015.  He developed castration resistant disease in 2015.  Ketoconazole at 200 mg twice a day and prednisone 5 mg daily. Therapy discontinued on 05/24/2016 because of progression of disease. Zytiga 1000 mg daily with prednisone at 5 mg daily started in November 2017. Xtandi 160 mg daily started in April 2019 therapy discontinued due to progression of disease and July 2019. [name] started in July 2019.  He completed 6 months of therapy in December 2019. Xtandi 160 mg resumed in August 2019 after brief discontinuation in July 2019.  Therapy discontinued permanently in January 2020 for progression of disease. Taxotere chemotherapy started on August 21, 2018 with 75 mg/m.  He completed 9 cycles of therapy in July 2020.  Current therapy: Active surveillance in a treatment break.    Interim History:  Mr. Edward Bailey returns today for a repeat evaluation.  Since the last visit, he reports no major changes or complaints at this time.  His lower back pain has actually improved since he made this appointment.  He denies any other bone pain or pathological fractures.  He denies any changes in his appetite.  He denies any poor p.o. intake with weight being relatively stable.  He still has some issues with fatigue and tiredness as well as neuropathy which is unchanged since discontinuation of  chemotherapy.   He denied any alteration mental status, neuropathy, confusion or dizziness.  Denies any headaches or lethargy.  Denies any night sweats, weight loss or changes in appetite.  Denied orthopnea, dyspnea on exertion or chest discomfort.  Denies shortness of breath, difficulty breathing hemoptysis or cough.  Denies any abdominal distention, nausea, early satiety or dyspepsia.  Denies any hematuria, frequency, dysuria or nocturia.  Denies any skin irritation, dryness or rash.  Denies any ecchymosis or petechiae.  Denies any lymphadenopathy or clotting.  Denies any heat or cold intolerance.  Denies any anxiety or depression.  Remaining review of system is negative.             Medications: Updated today without any change. Current Outpatient Medications  Medication Sig Dispense Refill  . amLODipine (NORVASC) 5 MG tablet TK 1 T PO QD  3  . aspirin (ASPIRIN EC) 81 MG EC tablet Take 81 mg by mouth daily. Swallow whole.    Marland Kitchen azelastine (ASTELIN) 0.1 % nasal spray Place 1-2 sprays into both nostrils daily as needed. allergies  5  . cetirizine (ZYRTEC) 10 MG tablet Take 10 mg by mouth daily as needed for allergies. As needed    . fluticasone (FLONASE) 50 MCG/ACT nasal spray Place 1 spray into both nostrils daily as needed. Allergies  5  . levothyroxine (SYNTHROID, LEVOTHROID) 112 MCG tablet TK 1 T PO QD IN THE MORNING OES  12  . levothyroxine (SYNTHROID, LEVOTHROID) 125 MCG tablet Alternates with 112 mcg  9  . lidocaine-prilocaine (EMLA) cream Apply 1 application topically as needed. [place]  g 0  . Multiple Vitamins-Minerals (MULTIVITAMIN PO) Take 1 capsule by mouth daily.    . prochlorperazine (COMPAZINE) 10 MG tablet Take 1 tablet (10 mg total) by mouth every 6 (six) hours as needed for nausea or vomiting. 30 tablet 0   No current facility-administered medications for this visit.      Allergies:  Allergies  Allergen Reactions  . Amoxicillin Rash    Has patient had a PCN reaction  causing immediate rash, facial/tongue/throat swelling, SOB or lightheadedness with hypotension: No Has patient had a PCN reaction causing severe rash involving mucus membranes or skin necrosis: No Has patient had a PCN reaction that required hospitalization No Has patient had a PCN reaction occurring within the last 10 years: Yes If all of the above answers are "NO", then may proceed with Cephalosporin use.     Past Medical History, Surgical history, Social history, and Family History updated remained without change.   Physical Exam: Blood pressure (!) 125/58, pulse (!) 109, temperature 98.3 F (36.8 C), temperature source Temporal, resp. rate 18, height 6\' 2"  (1.88 m), weight 202 lb 9.6 oz (91.9 kg), SpO2 100 %.    ECOG: 1   General appearance: Alert, awake without any distress. Head: Atraumatic without abnormalities Oropharynx: Without any thrush or ulcers. Eyes: No scleral icterus. Lymph nodes: No lymphadenopathy noted in the cervical, supraclavicular, or axillary nodes Heart:regular rate and rhythm, without any murmurs or gallops.   Lung: Clear to auscultation without any rhonchi, wheezes or dullness to percussion. Abdomin: Soft, nontender without any shifting dullness or ascites. Musculoskeletal: No clubbing or cyanosis. Neurological: No motor or sensory deficits. Skin: No rashes or lesions.             Lab Results: Lab Results  Component Value Date   WBC 4.3 02/24/2019   HGB 8.1 (L) 02/24/2019   HCT 25.2 (L) 02/24/2019   MCV 104.1 (H) 02/24/2019   PLT 105 (L) 02/24/2019     Chemistry      Component Value Date/Time   NA 133 (L) 02/24/2019 1028   NA 139 06/07/2017 1057   K 4.4 02/24/2019 1028   K 3.6 06/07/2017 1057   CL 104 02/24/2019 1028   CO2 21 (L) 02/24/2019 1028   CO2 26 06/07/2017 1057   BUN 15 02/24/2019 1028   BUN 18.5 06/07/2017 1057   CREATININE 0.89 02/24/2019 1028   CREATININE 1.2 06/07/2017 1057      Component Value Date/Time    CALCIUM 8.8 (L) 02/24/2019 1028   CALCIUM 9.0 06/07/2017 1057   ALKPHOS 56 02/24/2019 1028   ALKPHOS 112 06/07/2017 1057   AST 28 02/24/2019 1028   AST 14 06/07/2017 1057   ALT 8 02/24/2019 1028   ALT 12 06/07/2017 1057   BILITOT 0.4 02/24/2019 1028   BILITOT 0.66 06/07/2017 1057        Results for Edward Bailey, Edward Bailey (MRN [id number]) as of 03/16/2019 09:32  Ref. Range 01/16/2019 11:29 02/06/2019 12:33 02/24/2019 10:28  Prostate Specific Ag, Serum Latest Ref Range: 0.0 - 4.0 ng/mL 667.0 (H) 661.0 (H) 664.0 (H)    IMPRESSION: Compared to the prior study, there is less prominent abnormal radiotracer activity throughout the axial skeleton.   IMPRESSION: 1. Progressive osseous metastatic lesions as detailed above. No pathologic fracture or canal compromise. 2. Status post prostatectomy. No evidence for recurrent tumor in the pelvis and no adenopathy in the abdomen. 3. No acute abdominal/pelvic findings.    Impression and Plan:  83 year old man with  1.  Advanced prostate cancer with disease to the bones since 2017.  He has castration-resistant disease and status post therapy as outlined above.   Imaging studies including CT scan and bone scan obtained on 02/24/2019 were personally reviewed and discussed with the patient again today.  His disease is relatively stable but continues to have large bulk of metastasis predominantly in the bone.  Risks and benefits of additional chemotherapy without in the form of Taxotere or Jevtana versus proceeding with supportive care only potentially hospice was discussed.  We also discussed the option of obtaining next generation sequencing to find actionable mutation specifically with a PARP inhibitor.  After discussion today, he is okay with continuing active surveillance for the time being.  He is willing to proceed with guardant 360 tumor analysis after discussing the risks and benefits of such study.  Consent form was obtained and PARP  inhibitor would be reasonable if he harbors an appropriate mutation.     2. Androgen depravation: He has been receiving it under the care of [facility].  3.  Bone directed therapy: He continues to receive that under the care of Dr. Diona Fanti.  No pathological fractures noted.   4.  Anemia: Due to malignancy and chemotherapy.  Hemoglobin is relatively stable at this time.  I will repeat hemoglobin and iron studies today and replace as needed.  5.  IV access: Port-A-Cath remains in use without any issues.  This will be flushed periodically.  6.  Prognosis and goals of care: This was discussed today in details with the patient as well as the natural course of his cancer.  He understands his disease is incurable and would likely cause him further debilitation in the immediate future.  Given his age and declining performance status he would qualify for hospice as well if he opted for no further treatment.  7. Followup: Will be on September 10 for repeat evaluation.   25 minutes was spent with the patient face-to-face today.  More than 50% of time was spent on updating his disease status including imaging studies, laboratory data, treatment options and prognosis for future plan of care.  Zola Button, MD 8/24/20209:30 AM

## 2019-03-17 LAB — PROSTATE-SPECIFIC AG, SERUM (LABCORP): Prostate Specific Ag, Serum: 1069 ng/mL — ABNORMAL HIGH (ref 0.0–4.0)

## 2019-03-20 ENCOUNTER — Encounter: Payer: Self-pay | Admitting: Oncology

## 2019-03-22 ENCOUNTER — Encounter: Payer: Self-pay | Admitting: Oncology

## 2019-03-24 ENCOUNTER — Other Ambulatory Visit: Payer: Medicare Other | Admitting: Internal Medicine

## 2019-03-24 ENCOUNTER — Other Ambulatory Visit: Payer: Self-pay

## 2019-03-24 DIAGNOSIS — Z515 Encounter for palliative care: Secondary | ICD-10-CM

## 2019-03-24 DIAGNOSIS — R200 Anesthesia of skin: Secondary | ICD-10-CM | POA: Diagnosis not present

## 2019-03-24 DIAGNOSIS — R202 Paresthesia of skin: Secondary | ICD-10-CM | POA: Diagnosis not present

## 2019-03-24 DIAGNOSIS — R630 Anorexia: Secondary | ICD-10-CM | POA: Diagnosis not present

## 2019-03-24 NOTE — Progress Notes (Addendum)
Sept 1st, 2020 [facility] Consult Note Telephone: [phone]  Fax: [phone]  Due to the current COVID-19 infection/crises, the family prefer, and have given their verbal consent for, a provider visit via telemedicine. HIPPA policies of confidentially were discussed. Video-audio (telehealth) contact was unable to be done due technical barriers from the patient's side.  PATIENT NAME: Edward Bailey DOB: 1935/09/18 MRN: [id number] [address] [address] Pt home #:  [phone] Cell:  [phone]  PRIMARY CARE PROVIDER:   [name], MD [address] [place], [place] [address]  REFERRING PROVIDER:   Dr. Franchot Gallo ([facility] [phone]) Dr. Zola Button (Oncology) RESPONSIBLE PARTY:   Joycelyn Schmid (wife) (H) [phone], [phone]) [phone]. (son) [facility] [email or web address]  ASSESSMENT / RECOMMENDATIONS:  1. Advance Care Planning: A. Advance Care Directives: Discussed today; patient desires DNR. I completed the form and uploaded into [facility]. We discussed the sections of the MOST form in some detail. He wished to further discuss with his spouse [name]. I will mail him a blank copy of the form along with patient educational material regarding this document and will review and perhaps complete at our next visit in October.  B. Goals of Care: Per Dr. Hazeline Junker note, patient understands his disease is incurable an would likely cause him further debilitation in the immediate future. Would qualify for hospice if he opted for no further treatment. Has f/u Sept 10th Dr. Alen Blew for repeat evaluation. Continuing active surveillance for the time being.  Plan to proceed with guardant 360 tumor analysis.  PARP inhibitor would be reasonable if he harbors an appropriate mutation.  2.Cognitive / Functional status: Able to ambulate about the home with occasional use of his cane. No  falls, but c/o feeling cloudy headed. Increased somnolence so napping frequently through the day as well as frequent naps. Estimates total awake time at 8 hours/day. Diminished appetite over last few weeks. Skipping some meals, others consumes 25%. Patient's current weight 202 lbs. At a height of 6'2" his BMI is 26kg/m2. He has been Ensure qd and plans to increase to tid. Denies nausea. Has developed numbness and tingling to his chin which is irritating. 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 Tendency towards constipation (BM q 5-6d); currently taking a stool softener. He hasn't been taking his Norvasc. Denies pain. He has h/o anemia; last Hgb 8.1. -hold Norvasc for now; check BP qd to bid and I'll f/u on the readings with them -consider appetite stimulant and nutritionist consult through [place] 1-2 tabs qd to bid; adjust to effect -restart Claritin and Flonase nasal spray (trial for a week), as reports cloudy headedness reminiscent of when his allergies act up   3. Family Supports: Lives with supportive spouse [name]".  4. Follow up Palliative Care Visit: 04/27/2019 @ noon Telehealth/audio visit. Plan to f/u on mailed MOST form, BP, fatigue/weakness (anemia/decreased appetite/thyroid/hypotension/allergies?) I spent 60 minutes providing this consultation, from 1pm to 2pm. More than 50% of the time in this consultation was spent coordinating communication.   HISTORY OF PRESENT ILLNESS:  Edward Bailey is a 83 y.o. male with prostate cancer (dx 1999; resection ,chemo; continues monthly Xgeva q mo and Depot Lurpon q 4 mo depending on testosterone level) with bony metastasis (dx 2017; spine and pelvis), anemia (Hbg 8.1), GERD, HTN, hyper/hypothyroid, and allergic rhinitis.  Palliative Care was asked to help address goals of care.  CODE STATUS: DNR; discussed and agreed upon today.   PPS: 60%  HOSPICE ELIGIBILITY/DIAGNOSIS: yes, pending deferral  of further chemo therapy  PAST MEDICAL HISTORY:  Past Medical History:  Diagnosis Date  . GERD (gastroesophageal reflux disease)   . Hyperthyroidism   . Prostate cancer [facility])    prostate cancer radiation and surgery 15 years ago, Dr. Maceo Pro    SOCIAL HX:  Social History   Tobacco Use  . Smoking status: Never Smoker  . Smokeless tobacco: Never Used  Substance Use Topics  . Alcohol use: Yes    Comment: occasional    ALLERGIES:  Allergies  Allergen Reactions  . Amoxicillin Rash    Has patient had a PCN reaction causing immediate rash, facial/tongue/throat swelling, SOB or lightheadedness with hypotension: No Has patient had a PCN reaction causing severe rash involving mucus membranes or skin necrosis: No Has patient had a PCN reaction that required hospitalization No Has patient had a PCN reaction occurring within the last 10 years: Yes If all of the above answers are "NO", then may proceed with Cephalosporin use.      PERTINENT MEDICATIONS:  Outpatient Encounter Medications as of 03/24/2019  Medication Sig  . amLODipine (NORVASC) 5 MG tablet TK 1 T PO QD  . aspirin (ASPIRIN EC) 81 MG EC tablet Take 81 mg by mouth daily. Swallow whole.  Marland Kitchen azelastine (ASTELIN) 0.1 % nasal spray Place 1-2 sprays into both nostrils daily as needed. allergies  . cetirizine (ZYRTEC) 10 MG tablet Take 10 mg by mouth daily as needed for allergies. As needed  . fluticasone (FLONASE) 50 MCG/ACT nasal spray Place 1 spray into both nostrils daily as needed. Allergies  . levothyroxine (SYNTHROID, LEVOTHROID) 112 MCG tablet TK 1 T PO QD IN THE MORNING OES  . levothyroxine (SYNTHROID, LEVOTHROID) 125 MCG tablet Alternates with 112 mcg  . lidocaine-prilocaine (EMLA) cream Apply 1 application topically as needed.  . Multiple Vitamins-Minerals (MULTIVITAMIN PO) Take 1 capsule by mouth daily.  . prochlorperazine (COMPAZINE) 10 MG tablet Take 1 tablet (10 mg total) by mouth every 6 (six) hours as needed for  nausea or vomiting.   No facility-administered encounter medications on file as of 03/24/2019.     PHYSICAL EXAM:   PE exam deferred d/t telephonic/audio only nature of telehealth visit. Patient is alert and oriented. Speech soft. No dyspnea with conversation.  Julianne Handler, NP

## 2019-03-25 ENCOUNTER — Encounter: Payer: Self-pay | Admitting: Oncology

## 2019-03-26 ENCOUNTER — Encounter (HOSPITAL_COMMUNITY): Payer: Self-pay

## 2019-03-26 ENCOUNTER — Telehealth: Payer: Self-pay

## 2019-03-26 ENCOUNTER — Inpatient Hospital Stay (HOSPITAL_COMMUNITY)
Admission: EM | Admit: 2019-03-26 | Discharge: 2019-03-29 | DRG: 644 | Disposition: A | Discharge status: Home Health | Payer: Medicare Other | Attending: Family Medicine | Admitting: Family Medicine

## 2019-03-26 ENCOUNTER — Emergency Department (HOSPITAL_COMMUNITY): Payer: Medicare Other

## 2019-03-26 ENCOUNTER — Other Ambulatory Visit: Payer: Self-pay

## 2019-03-26 ENCOUNTER — Encounter: Payer: Self-pay | Admitting: Internal Medicine

## 2019-03-26 ENCOUNTER — Telehealth: Payer: Self-pay | Admitting: Internal Medicine

## 2019-03-26 DIAGNOSIS — Z20828 Contact with and (suspected) exposure to other viral communicable diseases: Secondary | ICD-10-CM | POA: Diagnosis not present

## 2019-03-26 DIAGNOSIS — W19XXXA Unspecified fall, initial encounter: Secondary | ICD-10-CM | POA: Diagnosis present

## 2019-03-26 DIAGNOSIS — I10 Essential (primary) hypertension: Secondary | ICD-10-CM | POA: Diagnosis not present

## 2019-03-26 DIAGNOSIS — Y92009 Unspecified place in unspecified non-institutional (private) residence as the place of occurrence of the external cause: Secondary | ICD-10-CM | POA: Diagnosis not present

## 2019-03-26 DIAGNOSIS — D630 Anemia in neoplastic disease: Secondary | ICD-10-CM | POA: Diagnosis not present

## 2019-03-26 DIAGNOSIS — Z79899 Other long term (current) drug therapy: Secondary | ICD-10-CM | POA: Diagnosis not present

## 2019-03-26 DIAGNOSIS — W07XXXA Fall from chair, initial encounter: Secondary | ICD-10-CM | POA: Diagnosis present

## 2019-03-26 DIAGNOSIS — Z79890 Hormone replacement therapy (postmenopausal): Secondary | ICD-10-CM | POA: Diagnosis not present

## 2019-03-26 DIAGNOSIS — A419 Sepsis, unspecified organism: Secondary | ICD-10-CM | POA: Diagnosis present

## 2019-03-26 DIAGNOSIS — Y939 Activity, unspecified: Secondary | ICD-10-CM

## 2019-03-26 DIAGNOSIS — Z9089 Acquired absence of other organs: Secondary | ICD-10-CM | POA: Diagnosis not present

## 2019-03-26 DIAGNOSIS — C61 Malignant neoplasm of prostate: Secondary | ICD-10-CM | POA: Diagnosis not present

## 2019-03-26 DIAGNOSIS — Z95828 Presence of other vascular implants and grafts: Secondary | ICD-10-CM

## 2019-03-26 DIAGNOSIS — Z9221 Personal history of antineoplastic chemotherapy: Secondary | ICD-10-CM

## 2019-03-26 DIAGNOSIS — D649 Anemia, unspecified: Secondary | ICD-10-CM | POA: Diagnosis present

## 2019-03-26 DIAGNOSIS — K5900 Constipation, unspecified: Secondary | ICD-10-CM | POA: Diagnosis not present

## 2019-03-26 DIAGNOSIS — Z6826 Body mass index (BMI) 26.0-26.9, adult: Secondary | ICD-10-CM | POA: Diagnosis not present

## 2019-03-26 DIAGNOSIS — Z9181 History of falling: Secondary | ICD-10-CM

## 2019-03-26 DIAGNOSIS — Z923 Personal history of irradiation: Secondary | ICD-10-CM

## 2019-03-26 DIAGNOSIS — I493 Ventricular premature depolarization: Secondary | ICD-10-CM | POA: Diagnosis not present

## 2019-03-26 DIAGNOSIS — R630 Anorexia: Secondary | ICD-10-CM | POA: Diagnosis present

## 2019-03-26 DIAGNOSIS — Z880 Allergy status to penicillin: Secondary | ICD-10-CM | POA: Diagnosis not present

## 2019-03-26 DIAGNOSIS — J3489 Other specified disorders of nose and nasal sinuses: Secondary | ICD-10-CM | POA: Diagnosis not present

## 2019-03-26 DIAGNOSIS — C7951 Secondary malignant neoplasm of bone: Secondary | ICD-10-CM | POA: Diagnosis present

## 2019-03-26 DIAGNOSIS — G319 Degenerative disease of nervous system, unspecified: Secondary | ICD-10-CM | POA: Diagnosis not present

## 2019-03-26 DIAGNOSIS — E222 Syndrome of inappropriate secretion of antidiuretic hormone: Secondary | ICD-10-CM | POA: Diagnosis not present

## 2019-03-26 DIAGNOSIS — G939 Disorder of brain, unspecified: Secondary | ICD-10-CM | POA: Diagnosis not present

## 2019-03-26 DIAGNOSIS — E871 Hypo-osmolality and hyponatremia: Secondary | ICD-10-CM | POA: Diagnosis not present

## 2019-03-26 DIAGNOSIS — R000 Tachycardia, unspecified: Secondary | ICD-10-CM | POA: Diagnosis not present

## 2019-03-26 DIAGNOSIS — G4733 Obstructive sleep apnea (adult) (pediatric): Secondary | ICD-10-CM | POA: Diagnosis not present

## 2019-03-26 DIAGNOSIS — R5383 Other fatigue: Secondary | ICD-10-CM

## 2019-03-26 DIAGNOSIS — S50312A Abrasion of left elbow, initial encounter: Secondary | ICD-10-CM | POA: Diagnosis not present

## 2019-03-26 DIAGNOSIS — Z8042 Family history of malignant neoplasm of prostate: Secondary | ICD-10-CM | POA: Diagnosis not present

## 2019-03-26 DIAGNOSIS — Z7951 Long term (current) use of inhaled steroids: Secondary | ICD-10-CM

## 2019-03-26 DIAGNOSIS — R531 Weakness: Secondary | ICD-10-CM | POA: Diagnosis not present

## 2019-03-26 DIAGNOSIS — E039 Hypothyroidism, unspecified: Secondary | ICD-10-CM | POA: Diagnosis present

## 2019-03-26 DIAGNOSIS — R509 Fever, unspecified: Secondary | ICD-10-CM | POA: Diagnosis not present

## 2019-03-26 LAB — CBC WITH DIFFERENTIAL/PLATELET
Abs Immature Granulocytes: 0.05 10*3/uL (ref 0.00–0.07)
Basophils Absolute: 0 10*3/uL (ref 0.0–0.1)
Basophils Relative: 0 %
Eosinophils Absolute: 0 10*3/uL (ref 0.0–0.5)
Eosinophils Relative: 0 %
HCT: 22.4 % — ABNORMAL LOW (ref 39.0–52.0)
Hemoglobin: 7.4 g/dL — ABNORMAL LOW (ref 13.0–17.0)
Immature Granulocytes: 1 %
Lymphocytes Relative: 3 %
Lymphs Abs: 0.2 10*3/uL — ABNORMAL LOW (ref 0.7–4.0)
MCH: 31.4 pg (ref 26.0–34.0)
MCHC: 33 g/dL (ref 30.0–36.0)
MCV: 94.9 fL (ref 80.0–100.0)
Monocytes Absolute: 0.8 10*3/uL (ref 0.1–1.0)
Monocytes Relative: 11 %
Neutro Abs: 6.4 10*3/uL (ref 1.7–7.7)
Neutrophils Relative %: 85 %
Platelets: 148 10*3/uL — ABNORMAL LOW (ref 150–400)
RBC: 2.36 MIL/uL — ABNORMAL LOW (ref 4.22–5.81)
RDW: 17.6 % — ABNORMAL HIGH (ref 11.5–15.5)
WBC: 7.5 10*3/uL (ref 4.0–10.5)
nRBC: 0 % (ref 0.0–0.2)

## 2019-03-26 LAB — URINALYSIS, ROUTINE W REFLEX MICROSCOPIC
Bacteria, UA: NONE SEEN
Bilirubin Urine: NEGATIVE
Glucose, UA: NEGATIVE mg/dL
Hgb urine dipstick: NEGATIVE
Ketones, ur: NEGATIVE mg/dL
Leukocytes,Ua: NEGATIVE
Nitrite: NEGATIVE
Protein, ur: 30 mg/dL — AB
Specific Gravity, Urine: 1.016 (ref 1.005–1.030)
pH: 6 (ref 5.0–8.0)

## 2019-03-26 LAB — LACTIC ACID, PLASMA
Lactic Acid, Venous: 1.9 mmol/L (ref 0.5–1.9)
Lactic Acid, Venous: 1 mmol/L (ref 0.5–1.9)

## 2019-03-26 LAB — COMPREHENSIVE METABOLIC PANEL
ALT: 11 U/L (ref 0–44)
AST: 53 U/L — ABNORMAL HIGH (ref 15–41)
Albumin: 2.8 g/dL — ABNORMAL LOW (ref 3.5–5.0)
Alkaline Phosphatase: 64 U/L (ref 38–126)
Anion gap: 10 (ref 5–15)
BUN: 18 mg/dL (ref 8–23)
CO2: 22 mmol/L (ref 22–32)
Calcium: 8.2 mg/dL — ABNORMAL LOW (ref 8.9–10.3)
Chloride: 93 mmol/L — ABNORMAL LOW (ref 98–111)
Creatinine, Ser: 0.9 mg/dL (ref 0.61–1.24)
GFR calc Af Amer: >60 mL/min (ref >60)
GFR calc non Af Amer: >60 mL/min (ref >60)
Glucose, Bld: 147 mg/dL — ABNORMAL HIGH (ref 70–99)
Potassium: 4.5 mmol/L (ref 3.5–5.1)
Sodium: 125 mmol/L — ABNORMAL LOW (ref 135–145)
Total Bilirubin: 0.5 mg/dL (ref 0.3–1.2)
Total Protein: 5.6 g/dL — ABNORMAL LOW (ref 6.5–8.1)

## 2019-03-26 LAB — ABO/RH: ABO/RH(D): O POS

## 2019-03-26 LAB — PROTIME-INR
INR: 1.3 — ABNORMAL HIGH (ref 0.8–1.2)
Prothrombin Time: 15.6 seconds — ABNORMAL HIGH (ref 11.4–15.2)

## 2019-03-26 LAB — SARS CORONAVIRUS 2 BY RT PCR (HOSPITAL ORDER, PERFORMED IN ~~LOC~~ HOSPITAL LAB): SARS Coronavirus 2: NEGATIVE

## 2019-03-26 LAB — TSH: TSH: 4.756 u[IU]/mL — ABNORMAL HIGH (ref 0.350–4.500)

## 2019-03-26 LAB — APTT: aPTT: 36 seconds (ref 24–36)

## 2019-03-26 LAB — T4, FREE: Free T4: 1.46 ng/dL — ABNORMAL HIGH (ref 0.61–1.12)

## 2019-03-26 MED ORDER — MEGESTROL ACETATE 400 MG/10ML PO SUSP: 400.0000 mg | Freq: Every day | ORAL | 0 refills | Status: DC

## 2019-03-26 MED ORDER — ACETAMINOPHEN 500 MG PO TABS
500.0000 mg | ORAL_TABLET | Freq: Four times a day (QID) | ORAL | Status: DC | PRN
  Administered 2019-03-26: 22:00:00 500 mg via ORAL
  Filled 2019-03-26: qty 1

## 2019-03-26 MED ORDER — SODIUM CHLORIDE 0.9 % IV SOLN
INTRAVENOUS | Status: DC
  Administered 2019-03-26 – 2019-03-27 (×2): via INTRAVENOUS

## 2019-03-26 MED ORDER — VANCOMYCIN HCL IN DEXTROSE 1-5 GM/200ML-% IV SOLN: 1000.0000 mg | Freq: Once | INTRAVENOUS | Status: DC

## 2019-03-26 MED ORDER — SODIUM CHLORIDE 0.9 % IV SOLN
2.0000 g | Freq: Once | INTRAVENOUS | Status: AC
  Administered 2019-03-26: 2 g via INTRAVENOUS
  Filled 2019-03-26: qty 2

## 2019-03-26 MED ORDER — LACTATED RINGERS IV BOLUS
1000.0000 mL | Freq: Once | INTRAVENOUS | Status: AC
  Administered 2019-03-26: 20:00:00 1000 mL via INTRAVENOUS

## 2019-03-26 MED ORDER — SODIUM CHLORIDE 0.9% FLUSH
10.0000 mL | Freq: Two times a day (BID) | INTRAVENOUS | Status: DC
  Administered 2019-03-26 – 2019-03-27 (×3): 10 mL
  Administered 2019-03-28: 20 mL
  Administered 2019-03-29: 10 mL

## 2019-03-26 MED ORDER — VANCOMYCIN HCL IN DEXTROSE 1-5 GM/200ML-% IV SOLN
1000.0000 mg | Freq: Two times a day (BID) | INTRAVENOUS | Status: DC
  Administered 2019-03-27 – 2019-03-29 (×5): 1000 mg via INTRAVENOUS
  Filled 2019-03-26 (×7): qty 200

## 2019-03-26 MED ORDER — BACITRACIN ZINC 500 UNIT/GM EX OINT
TOPICAL_OINTMENT | Freq: Once | CUTANEOUS | Status: AC
  Administered 2019-03-26: via TOPICAL
  Filled 2019-03-26: qty 0.9

## 2019-03-26 MED ORDER — SODIUM CHLORIDE 0.9 % IV SOLN
2.0000 g | Freq: Three times a day (TID) | INTRAVENOUS | Status: DC
  Administered 2019-03-27 – 2019-03-29 (×6): 2 g via INTRAVENOUS
  Filled 2019-03-26 (×7): qty 2

## 2019-03-26 MED ORDER — VANCOMYCIN HCL 10 G IV SOLR
2000.0000 mg | Freq: Once | INTRAVENOUS | Status: AC
  Administered 2019-03-26: 23:00:00 2000 mg via INTRAVENOUS
  Filled 2019-03-26: qty 2000

## 2019-03-26 MED ORDER — SODIUM CHLORIDE 0.9 % IV SOLN: 2.0000 g | Freq: Once | INTRAVENOUS | Status: DC

## 2019-03-26 MED ORDER — SODIUM CHLORIDE 0.9% FLUSH: 10.0000 mL | INTRAVENOUS | Status: DC | PRN

## 2019-03-26 NOTE — Telephone Encounter (Signed)
Patient requested for next appointment on 04/02/19 to be a phone visit. Scheduling message sent. Pt aware of change.

## 2019-03-26 NOTE — Progress Notes (Signed)
Pharmacy Antibiotic Note  Edward Bailey is a 83 y.o. male admitted on 03/26/2019 with sepsis.  Pharmacy has been consulted for vancomycin/cefepime dosing. Afebrile, WBC wnl, LA 1.9. SCr 0.9 on admit. Noted hx of "rash" allergy to amoxicillin in Epic, not noted to be anaphylactic reaction. Per discussion with ED PA, ok to try cefepime.  Plan: Cefepime 2g IV x 1; then 2g IV q8h Vancomycin 2g IV x1; then Vancomycin 1000 mg IV Q 12 hrs. Goal AUC 400-550. Expected AUC: [id number] SCr used: 0.9 Monitor clinical progress, c/s, renal function F/u de-escalation plan/LOT, vancomycin levels as indicated      Temp (24hrs), Avg:100.8 F (38.2 C), Min:100.8 F (38.2 C), Max:100.8 F (38.2 C)  Recent Labs  Lab 03/26/19 1944  WBC 7.5    Estimated Creatinine Clearance: 67.4 mL/min (by C-G formula based on SCr of 1 mg/dL).    Allergies  Allergen Reactions  . Amoxicillin Rash    Has patient had a PCN reaction causing immediate rash, facial/tongue/throat swelling, SOB or lightheadedness with hypotension: No Has patient had a PCN reaction causing severe rash involving mucus membranes or skin necrosis: No Has patient had a PCN reaction that required hospitalization No Has patient had a PCN reaction occurring within the last 10 years: Yes If all of the above answers are "NO", then may proceed with Cephalosporin use.     Antimicrobials this admission: 9/3 vancomycin >>  9/3 cefepime >>   Dose adjustments this admission:   Microbiology results:   Elicia Lamp, PharmD, BCPS Clinical Pharmacist 03/26/2019 8:05 PM

## 2019-03-26 NOTE — ED Notes (Addendum)
Edward Bailey (spouse) would like to be called with all updates.  [phone]

## 2019-03-26 NOTE — H&P (Signed)
History and Physical    Edward Bailey [id number] DOB: 1935-11-22 DOA: 03/26/2019  Referring MD/NP/PA:   PCP: [name], MD   Patient coming from:  The patient is coming from home.  At baseline, pt is independent for most of ADL.        Chief Complaint: Generalized weakness, fever  HPI: Edward Bailey is a 83 y.o. male with medical history significant of hypertension, GERD, hypothyroidism, metastasized prostate cancer (s/p of radiation, chemo and surgery), who presents with generalized weakness and fever.  Patient reports he stopped chemotherapy for his prostate cancer 7 weeks ago.Siince then he has been having progressive fatigue and weakness. He states that normally he is able to walk and get in and out of the car without difficulty, but in the last several days he feels difficult to do that.  Patient denies subjective fever and chills, but his temperature is 100.8 in ED.  Patient has poor appetite and decreased oral intake.  He denies chest pain, shortness of breath, cough.  No nausea vomiting, diarrhea, abdominal pain, symptoms of UTI, unilateral numbness.  No facial droop or slurred speech. Patient reports a recent fall from sitting during which he hit his head but did not lose consciousness.  ED Course: pt was found to have WBC 7.5, negative COVID-19 test, sodium 125, TSH 4.756, free T4 1.46, renal function normal, lactic acid 1.9, INR 1.3, PTT 36, temperature 100.8, blood pressure 190/98, 116/57, tachycardia, tachypnea, oxygen saturation 96% on room air.  Chest x-ray negative. CT-head is negative for acute intracranial abnormalities, but showed osseous metastasis. Patient is admitted to telemetry bed as inpatient.  Review of Systems:   General: has fevers, no chills, no body weight gain, has poor appetite, has fatigue HEENT: no blurry vision, hearing changes or sore throat Respiratory: no dyspnea, coughing, wheezing CV: no chest pain, no palpitations GI: no nausea,  vomiting, abdominal pain, diarrhea, has constipation GU: no dysuria, burning on urination, increased urinary frequency, hematuria  Ext: no leg edema Neuro: no unilateral weakness, numbness, or tingling, no vision change or hearing loss Skin: no rash, no skin tear. MSK: No muscle spasm, no deformity, no limitation of range of movement in spin Heme: No easy bruising.  Travel history: No recent long distant travel.  Allergy:  Allergies  Allergen Reactions   Amoxicillin Rash    Has patient had a PCN reaction causing immediate rash, facial/tongue/throat swelling, SOB or lightheadedness with hypotension: No Has patient had a PCN reaction causing severe rash involving mucus membranes or skin necrosis: No Has patient had a PCN reaction that required hospitalization No Has patient had a PCN reaction occurring within the last 10 years: Yes If all of the above answers are "NO", then may proceed with Cephalosporin use.     Past Medical History:  Diagnosis Date   GERD (gastroesophageal reflux disease)    Hyperthyroidism    Prostate cancer [facility])    prostate cancer radiation and surgery 15 years ago, Dr. Maceo Pro    Past Surgical History:  Procedure Laterality Date   COLONOSCOPY WITH PROPOFOL N/A 06/13/2015   Procedure: COLONOSCOPY WITH PROPOFOL;  Surgeon: Garlan Fair, MD;  Location: [facility];  Service: Endoscopy;  Laterality: N/A;   IR IMAGING GUIDED PORT INSERTION  08/19/2018   PROSTATE SURGERY     TONSILLECTOMY      Social History:  reports that he has never smoked. He has never used smokeless tobacco. He reports current alcohol use. He reports that he does  not use drugs.  Family History:  Family History  Problem Relation Age of Onset   Prostate cancer Brother    Prostate cancer Brother      Prior to Admission medications   Medication Sig Start Date End Date Taking? Authorizing Provider  amLODipine (NORVASC) 5 MG tablet Take 5 mg by mouth daily.  05/09/18  Yes  [provider]  cetirizine (ZYRTEC) 10 MG tablet Take 10 mg by mouth daily as needed for allergies or rhinitis.    Yes [provider]  levothyroxine (SYNTHROID, LEVOTHROID) 112 MCG tablet Take 112 mcg by mouth See admin instructions. Take 112 mcg by mouth every other night, rotating with the 125 mcg strength 04/02/16  Yes [provider]  levothyroxine (SYNTHROID, LEVOTHROID) 125 MCG tablet Take 125 mcg by mouth See admin instructions. Take 125 mcg by mouth every other night, rotating with the 112 mcg strength 06/12/16  Yes [provider]  Multiple Vitamins-Minerals (ONE-A-DAY MENS 50+) TABS Take 1 tablet by mouth daily with breakfast.   Yes [provider]  prochlorperazine (COMPAZINE) 10 MG tablet Take 1 tablet (10 mg total) by mouth every 6 (six) hours as needed for nausea or vomiting. 08/06/18  Yes Wyatt Portela, MD  azelastine (ASTELIN) 0.1 % nasal spray Place 1-2 sprays into both nostrils daily as needed for rhinitis or allergies.  04/21/15   [provider]  fluticasone (FLONASE) 50 MCG/ACT nasal spray Place 1 spray into both nostrils daily as needed for allergies or rhinitis.  04/21/15   [provider]  lidocaine-prilocaine (EMLA) cream Apply 1 application topically as needed. Patient taking differently: Apply 1 application topically as needed (to numb).  08/06/18   Wyatt Portela, MD  megestrol (MEGACE) 400 MG/10ML suspension Take 10 mLs (400 mg total) by mouth daily. 03/26/19   Wyatt Portela, MD    Physical Exam: Vitals:   03/27/19 0015 03/27/19 0030 03/27/19 0130 03/27/19 0145  BP:  (!) 111/51 (!) 113/50   Pulse:    86  Resp: (!) 30 (!) 28 (!) 23 (!) 24  Temp:   98.5 F (36.9 C)   TempSrc:   Oral   SpO2:    97%  Weight:      Height:       General: Not in acute distress HEENT:       Eyes: PERRL, EOMI, no scleral icterus.       ENT: No discharge from the ears and nose, no pharynx injection, no tonsillar  enlargement.        Neck: No JVD, no bruit, no mass felt. Heme: No neck lymph node enlargement. Cardiac: S1/S2, RRR, No murmurs, No gallops or rubs. Respiratory: No rales, wheezing, rhonchi or rubs. GI: Soft, nondistended, nontender, no rebound pain, no organomegaly, BS present. GU: No hematuria Ext: No pitting leg edema bilaterally. 2+DP/PT pulse bilaterally. Musculoskeletal: No joint deformities, No joint redness or warmth, no limitation of ROM in spin. Skin: No rashes.  Neuro: Alert, oriented X3, cranial nerves II-XII grossly intact, moves all extremities normally. Muscle strength 4/5 in all extremities, sensation to light touch intact.  Psych: Patient is not psychotic, no suicidal or hemocidal ideation.  Labs on Admission: I have personally reviewed following labs and imaging studies  CBC: Recent Labs  Lab 03/26/19 1944  WBC 7.5  NEUTROABS 6.4  HGB 7.4*  HCT 22.4*  MCV 94.9  PLT 123456*   Basic Metabolic Panel: Recent Labs  Lab 03/26/19 1944  NA 125*  K 4.5  CL 93*  CO2 22  GLUCOSE 147*  BUN 18  CREATININE 0.90  CALCIUM 8.2*   GFR: Estimated Creatinine Clearance: 74.8 mL/min (by C-G formula based on SCr of 0.9 mg/dL). Liver Function Tests: Recent Labs  Lab 03/26/19 1944  AST 53*  ALT 11  ALKPHOS 64  BILITOT 0.5  PROT 5.6*  ALBUMIN 2.8*   No results for input(s): LIPASE, AMYLASE in the last 168 hours. No results for input(s): AMMONIA in the last 168 hours. Coagulation Profile: Recent Labs  Lab 03/26/19 1944  INR 1.3*   Cardiac Enzymes: No results for input(s): CKTOTAL, CKMB, CKMBINDEX, TROPONINI in the last 168 hours. BNP (last 3 results) No results for input(s): PROBNP in the last 8760 hours. HbA1C: No results for input(s): HGBA1C in the last 72 hours. CBG: No results for input(s): GLUCAP in the last 168 hours. Lipid Profile: No results for input(s): CHOL, HDL, LDLCALC, TRIG, CHOLHDL, LDLDIRECT in the last 72 hours. Thyroid Function  Tests: Recent Labs    03/26/19 1945  TSH 4.756*  FREET4 1.46*   Anemia Panel: No results for input(s): VITAMINB12, FOLATE, FERRITIN, TIBC, IRON, RETICCTPCT in the last 72 hours. Urine analysis:    Component Value Date/Time   COLORURINE YELLOW 03/26/2019 2216   APPEARANCEUR CLEAR 03/26/2019 2216   LABSPEC 1.016 03/26/2019 2216   PHURINE 6.0 03/26/2019 2216   [place] NEGATIVE 03/26/2019 2216   [place] NEGATIVE 03/26/2019 2216   [place] NEGATIVE 03/26/2019 2216   [place] NEGATIVE 03/26/2019 2216   PROTEINUR 30 (A) 03/26/2019 2216   NITRITE NEGATIVE 03/26/2019 2216   LEUKOCYTESUR NEGATIVE 03/26/2019 2216   Sepsis Labs: @LABRCNTIP (procalcitonin:4,lacticidven:4) ) Recent Results (from the past 240 hour(s))  SARS Coronavirus 2 [facility] order, Performed in [facility] hospital lab) Nasopharyngeal Urine, Clean Catch     Status: None   Collection Time: 03/26/19 10:00 PM   Specimen: Urine, Clean Catch; Nasopharyngeal  Result Value Ref Range Status   SARS Coronavirus 2 NEGATIVE NEGATIVE Final    Comment: (NOTE) If result is NEGATIVE SARS-CoV-2 target nucleic acids are NOT DETECTED. The SARS-CoV-2 RNA is generally detectable in upper and lower  respiratory specimens during the acute phase of infection. The lowest  concentration of SARS-CoV-2 viral copies this assay can detect is 250  copies / mL. A negative result does not preclude SARS-CoV-2 infection  and should not be used as the sole basis for treatment or other  patient management decisions.  A negative result may occur with  improper specimen collection / handling, submission of specimen other  than nasopharyngeal swab, presence of viral mutation(s) within the  areas targeted by this assay, and inadequate number of viral copies  (<250 copies / mL). A negative result must be combined with clinical  observations, patient history, and epidemiological information. If result is POSITIVE SARS-CoV-2 target nucleic acids are  DETECTED. The SARS-CoV-2 RNA is generally detectable in upper and lower  respiratory specimens dur ing the acute phase of infection.  Positive  results are indicative of active infection with SARS-CoV-2.  Clinical  correlation with patient history and other diagnostic information is  necessary to determine patient infection status.  Positive results do  not rule out bacterial infection or co-infection with other viruses. If result is PRESUMPTIVE POSTIVE SARS-CoV-2 nucleic acids MAY BE PRESENT.   A presumptive positive result was obtained on the submitted specimen  and confirmed on repeat testing.  While 2019 novel coronavirus  (SARS-CoV-2) nucleic acids may be present in the submitted  sample  additional confirmatory testing may be necessary for epidemiological  and / or clinical management purposes  to differentiate between  SARS-CoV-2 and other Sarbecovirus currently known to infect humans.  If clinically indicated additional testing with an alternate test  methodology [phone]) is advised. The SARS-CoV-2 RNA is generally  detectable in upper and lower respiratory sp ecimens during the acute  phase of infection. The expected result is Negative. Fact Sheet for Patients:  StrictlyIdeas.no Fact Sheet for Healthcare Providers: BankingDealers.co.za This test is not yet approved or cleared by the [place] FDA and has been authorized for detection and/or diagnosis of SARS-CoV-2 by FDA under an Emergency Use Authorization (EUA).  This EUA will remain in effect (meaning this test can be used) for the duration of the COVID-19 declaration under Section 564(b)(1) of the Act, 21 U.S.C. section 360bbb-3(b)(1), unless the authorization is terminated or revoked sooner. Performed at [facility], [place] [address], [place], [place] [address]      Radiological Exams on Admission: Ct Head Wo Contrast  Result Date: 03/26/2019 CLINICAL DATA:   Ataxia. Weakness. Prostate cancer with bone metastasis. EXAM: CT HEAD WITHOUT CONTRAST TECHNIQUE: Contiguous axial images were obtained from the base of the skull through the vertex without intravenous contrast. COMPARISON:  02/20/2018 brain MR. FINDINGS: Brain: Cerebral and cerebellar atrophy, felt to be age appropriate. Moderate low density in the periventricular white matter likely related to small vessel disease. No mass lesion, hemorrhage, hydrocephalus, acute infarct, intra-axial, or extra-axial fluid collection. Vascular: Intracranial atherosclerosis. Skull: No significant soft tissue swelling. No skull fracture. There are sclerotic lesions throughout the calvarium consistent without osseous metastasis. Sinuses/Orbits: Enlargement of extraocular muscles, consistent with history of hyperthyroidism. Mucosal thickening of ethmoid air cells and the left maxillary sinus. Clear mastoid air cells. Other: None. IMPRESSION: 1. No acute intracranial abnormality. 2. Cerebral/cerebellar atrophy and small vessel ischemic change. 3. Sclerotic lesions throughout the calvarium, consistent with osseous metastasis. 4. Sinus disease. Electronically Signed   By: Abigail Miyamoto M.D.   On: 03/26/2019 20:28   Dg Chest Port 1 View  Result Date: 03/26/2019 CLINICAL DATA:  Fever EXAM: PORTABLE CHEST 1 VIEW COMPARISON:  10/15/2016 FINDINGS: Right-sided central venous port tip over the SVC. Normal heart size. No consolidation. Faint bilateral nodular foci of opacity, appears to project over skeletal structures. Most likely represents skeletal metastatic disease. No pneumothorax. IMPRESSION: 1. No acute airspace disease 2. Bilateral nodular foci over the chest, appear to correspond to sclerotic osseous lesions/skeletal metastatic disease. Electronically Signed   By: Donavan Foil M.D.   On: 03/26/2019 20:44     EKG: Independently reviewed.  Sinus rhythm, occasional PVC, QTC 438, early R wave progression  Assessment/Plan Principal  Problem:   Sepsis ([place]) Active Problems:   Prostate cancer metastatic to bone (HCC)   Hyponatremia   Hypothyroidism   Fall   Generalized weakness   HTN (hypertension)   Sepsis ([place]): Patient has fever, tachycardia and tachypnea, meets criteria for sepsis.  Blood pressure dropped from initial 190/98 to 116/57.  Etiology is not clear.  Urinalysis negative, chest x-ray negative.  COVID-19 negative.  Since patient is immunosuppressed due to history of chemotherapy, will start broad antibiotics.  - will admit to tele bed as inpt - Empiric antimicrobial treatment with vancomycin and cefepime - f/u Bx and Ux - will get Procalcitonin and trend lactic acid levels per sepsis protocol. - IVF: 1.0 L of ringer solution and followed by 75 cc/h - will get Procalcitonin and trend  lactic acid levels per sepsis protocol.  Prostate cancer metastatic to bone [facility]): s/p of radiation therapy and surgery.  Patient finished chemotherapy 7 weeks ago. -Follow-up with onc, Dr.Shadad  HTN: -hold amlodipine since patient at risk of developing hypotension due to sepsis -IV hydralazine as needed  Hyponatremia: Na 125.  Mental status normal.  Most likely due to poor oral intake and dehydration. - Will check urine sodium, urine osmolality, serum osmolality. - IVF: 1.0 L of ringer solution in ED, and followed by 75 cc/h - f/u by BMP - avoid over correction too fast due to risk of central pontine myelinolysis  Hypothyroidism: TSH slightly elevated 4.756, but Free T4 is also slightly elevatedd 1.46 -continue home Synthroid  Fall: Seems to be mechanical fall due to generalized weakness.  CT head is negative for acute intracranial abnormalities. -PT/OT  Generalized weakness: Likely multifactorial etiology, including sepsis, electrolytes disturbance -PT/OT as above  OSA: -CPAP  Inpatient status:  # Patient requires inpatient status due to high intensity of service, high risk for further deterioration and  high frequency of surveillance required.  I certify that at the point of admission it is my clinical judgment that the patient will require inpatient hospital care spanning beyond 2 midnights from the point of admission.   This patient has multiple chronic comorbidities including  hypertension, GERD, hypothyroidism, metastasized prostate cancer (s/p of radiation, chemo and surgery)  Now patient has presenting with sepsis, fall and generalized weakness  The worrisome physical exam findings include weakness  The initial radiographic and laboratory data are worrisome because of hyponatremia, sepsis  Current medical needs: please see my assessment and plan  Predictability of an adverse outcome (risk): Patient has multiple comorbidities, now presents with sepsis with unclear etiology.  Given his old age and immunosuppressed status, patient is at high risk of deteriorating.  Will need to be treated in the hospital for at least 2 days.        DVT ppx: SQ Lovenox Code Status: Full code Family Communication: None at bed side.  Disposition Plan:  Anticipate discharge back to previous home environment Consults called:  none Admission status:   Inpatient/tele      Date of Service 03/27/2019    [facility]   If 7PM-7AM, please contact night-coverage [email or web address] Password [email or web address] 03/27/2019, 5:21 AM

## 2019-03-26 NOTE — Telephone Encounter (Signed)
03/26/2019 4:30pm  Returned TC to spouse Clinical cytogeneticist. [name] reports patient with progressive weakness; from ambulatory a few days ago to now unable to stand or transfer.  We discussed options. I suggested she call for ambulance transport for ER for further evaluation.   Violeta Gelinas NP-C [place]

## 2019-03-26 NOTE — ED Notes (Signed)
Patient transported to CT 

## 2019-03-26 NOTE — Telephone Encounter (Signed)
Received call from patient's wife inquiring if Palliative was able to give flu vaccines. Spoke with NP who shared that pharmacies are not allowed to release the vaccine due to the specific care instructions. If vaccine becomes too hot or too cold, the vaccine will become ineffective. Patient's wife made aware

## 2019-03-26 NOTE — ED Triage Notes (Addendum)
Pt bib [facility] from home due to increasing weakness since he stopped chemo last week. On arrival initial BP was 190/98 while sitting. When he stood up BP dropped to 120 palpable per EMS. HR also increased from 106 to 122. Patient states he feels legs "just keep getting weaker", pt does admit to falling but not injuring himself and loss of appetite.

## 2019-03-26 NOTE — ED Provider Notes (Signed)
[facility] EMERGENCY DEPARTMENT Provider Note   CSN: [id number] Arrival date & time: 03/26/19  1901     History   Chief Complaint Chief Complaint  Patient presents with   Weakness    HPI WYAT KOSICK is a 83 y.o. male with a past medical history of hyperthyroidism and metastatic prostate cancer who presents to the emergency department with worsening fatigue/weakness. Patient reports he stopped chemotherapy for his prostate cancer 7 weeks ago. Patient reports since then he has had progressive fatigue and weakness that has acutely worsened the last several days. Patient reports usually he is able to walk and get in and out of the car without difficulty and the last several days he has been unable to do that. Patient reports recent poor PO intake due to lack of appetite and the water being shut off at his house for several days. Patient denies fevers at home, pain, shortness of breath, cough, abdominal pain, dysuria, vomiting, or diarrhea. Patient reports some constipation. Patient reports a recent fall from sitting during which he hit his head but did not lose consciousness. Patient denies dark tarry stools or being on blood thinners.      The history is provided by the patient and the spouse.    Past Medical History:  Diagnosis Date   GERD (gastroesophageal reflux disease)    Hyperthyroidism    Prostate cancer ([place])    prostate cancer radiation and surgery 15 years ago, Dr. Maceo Pro    Patient Active Problem List   Diagnosis Date Noted   Sepsis ([place]) 03/26/2019   Hyponatremia 03/26/2019   Hypothyroidism 03/26/2019   Fall 03/26/2019   Neuropathy 02/11/2019   Port-A-Cath in place 09/12/2018   Goals of care, counseling/discussion 08/06/2018   Prostate cancer metastatic to bone ([place]) 11/06/2017   Central sleep apnea 03/04/2017   PC (prostate cancer) ([place]) 10/13/2015    Past Surgical History:  Procedure Laterality Date   COLONOSCOPY WITH  PROPOFOL N/A 06/13/2015   Procedure: COLONOSCOPY WITH PROPOFOL;  Surgeon: Garlan Fair, MD;  Location: [facility];  Service: Endoscopy;  Laterality: N/A;   IR IMAGING GUIDED PORT INSERTION  08/19/2018   PROSTATE SURGERY     TONSILLECTOMY          Home Medications    Prior to Admission medications   Medication Sig Start Date End Date Taking? Authorizing Provider  amLODipine (NORVASC) 5 MG tablet Take 5 mg by mouth daily.  05/09/18  Yes [provider]  cetirizine (ZYRTEC) 10 MG tablet Take 10 mg by mouth daily as needed for allergies or rhinitis.    Yes [provider]  levothyroxine (SYNTHROID, LEVOTHROID) 112 MCG tablet Take 112 mcg by mouth See admin instructions. Take 112 mcg by mouth every other night, rotating with the 125 mcg strength 04/02/16  Yes [provider]  levothyroxine (SYNTHROID, LEVOTHROID) 125 MCG tablet Take 125 mcg by mouth See admin instructions. Take 125 mcg by mouth every other night, rotating with the 112 mcg strength 06/12/16  Yes [provider]  Multiple Vitamins-Minerals (ONE-A-DAY MENS 50+) TABS Take 1 tablet by mouth daily with breakfast.   Yes [provider]  prochlorperazine (COMPAZINE) 10 MG tablet Take 1 tablet (10 mg total) by mouth every 6 (six) hours as needed for nausea or vomiting. 08/06/18  Yes Wyatt Portela, MD  azelastine (ASTELIN) 0.1 % nasal spray Place 1-2 sprays into both nostrils daily as needed for rhinitis or allergies.  04/21/15   [provider]  fluticasone (FLONASE) 50 MCG/ACT nasal spray Place 1 spray into both nostrils daily as needed for allergies or rhinitis.  04/21/15   [provider]  lidocaine-prilocaine (EMLA) cream Apply 1 application topically as needed. Patient taking differently: Apply 1 application topically as needed (to numb).  08/06/18   Wyatt Portela, MD  megestrol (MEGACE) 400 MG/10ML suspension Take 10 mLs (400 mg total) by mouth daily. 03/26/19    Wyatt Portela, MD    Family History Family History  Problem Relation Age of Onset   Prostate cancer Brother    Prostate cancer Brother     Social History Social History   Tobacco Use   Smoking status: Never Smoker   Smokeless tobacco: Never Used  Substance Use Topics   Alcohol use: Yes    Comment: occasional   Drug use: No     Allergies   Amoxicillin   Review of Systems Review of Systems  Constitutional: Positive for appetite change, fatigue and fever.  HENT: Negative for congestion and trouble swallowing.   Eyes: Negative for visual disturbance.  Respiratory: Negative for cough and shortness of breath.   Cardiovascular: Negative for chest pain.  Gastrointestinal: Positive for constipation. Negative for abdominal pain, blood in stool, diarrhea, nausea and vomiting.  Genitourinary: Negative for difficulty urinating and dysuria.  Musculoskeletal: Positive for gait problem.  Skin: Negative for rash.  Neurological: Positive for weakness. Negative for seizures, syncope, light-headedness, numbness and headaches.  Psychiatric/Behavioral: Negative for confusion.     Physical Exam Updated Vital Signs BP (!) 118/58    Pulse 91    Temp (!) 100.8 F (38.2 C) (Oral)    Resp (!) 27    Ht 6\' 2"  (1.88 m)    Wt 93 kg    SpO2 97%    BMI 26.32 kg/m   Physical Exam Constitutional:      General: He is not in acute distress. HENT:     Head: Normocephalic and atraumatic.     Right Ear: External ear normal.     Left Ear: External ear normal.     Nose: Nose normal.     Mouth/Throat:     Mouth: Mucous membranes are moist.     Pharynx: Oropharynx is clear.  Eyes:     Pupils: Pupils are equal, round, and reactive to light.     Comments: Pale conjunctivae  Neck:     Musculoskeletal: Neck supple.  Cardiovascular:     Rate and Rhythm: Normal rate and regular rhythm.     Pulses: Normal pulses.     Comments: Port present in R chest Pulmonary:     Effort: Pulmonary effort  is normal. No respiratory distress.     Breath sounds: No stridor. No wheezing, rhonchi or rales.  Abdominal:     Palpations: Abdomen is soft.     Tenderness: There is no abdominal tenderness. There is no guarding or rebound.  Musculoskeletal:     Right lower leg: No edema.     Left lower leg: No edema.  Skin:    General: Skin is warm and dry.     Coloration: Skin is pale.     Findings: Bruising (scatterd on L arm, small abrasion on L elbow) present.  Neurological:     General: No focal deficit present.     Mental Status: He is alert and oriented to person, place, and time.     Cranial Nerves: No cranial nerve deficit.     Sensory:  No sensory deficit.     Motor: No weakness.     Coordination: Coordination normal.      ED Treatments / Results  Labs (all labs ordered are listed, but only abnormal results are displayed) Labs Reviewed  COMPREHENSIVE METABOLIC PANEL - Abnormal; Notable for the following components:      Result Value   Sodium 125 (*)    Chloride 93 (*)    Glucose, Bld 147 (*)    Calcium 8.2 (*)    Total Protein 5.6 (*)    Albumin 2.8 (*)    AST 53 (*)    All other components within normal limits  CBC WITH DIFFERENTIAL/PLATELET - Abnormal; Notable for the following components:   RBC 2.36 (*)    Hemoglobin 7.4 (*)    HCT 22.4 (*)    RDW 17.6 (*)    Platelets 148 (*)    Lymphs Abs 0.2 (*)    All other components within normal limits  PROTIME-INR - Abnormal; Notable for the following components:   Prothrombin Time 15.6 (*)    INR 1.3 (*)    All other components within normal limits  URINALYSIS, ROUTINE W REFLEX MICROSCOPIC - Abnormal; Notable for the following components:   Protein, ur 30 (*)    All other components within normal limits  TSH - Abnormal; Notable for the following components:   TSH 4.756 (*)    All other components within normal limits  T4, FREE - Abnormal; Notable for the following components:   Free T4 1.46 (*)    All other components  within normal limits  SARS CORONAVIRUS 2 (HOSPITAL ORDER, [facility])  CULTURE, BLOOD (ROUTINE X 2)  CULTURE, BLOOD (ROUTINE X 2)  URINE CULTURE  LACTIC ACID, PLASMA  LACTIC ACID, PLASMA  APTT  TYPE AND SCREEN  ABO/RH    EKG EKG Interpretation  Date/Time:  Thursday March 26 2019 19:22:39 EDT Ventricular Rate:  90 PR Interval:    QRS Duration: 89 QT Interval:  358 QTC Calculation: 438 R Axis:   71 Text Interpretation:  Sinus rhythm Premature ventricular complexes No previous tracing Confirmed by Lajean Saver [phone]) on 03/26/2019 8:36:43 PM   Radiology Ct Head Wo Contrast  Result Date: 03/26/2019 CLINICAL DATA:  Ataxia. Weakness. Prostate cancer with bone metastasis. EXAM: CT HEAD WITHOUT CONTRAST TECHNIQUE: Contiguous axial images were obtained from the base of the skull through the vertex without intravenous contrast. COMPARISON:  02/20/2018 brain MR. FINDINGS: Brain: Cerebral and cerebellar atrophy, felt to be age appropriate. Moderate low density in the periventricular white matter likely related to small vessel disease. No mass lesion, hemorrhage, hydrocephalus, acute infarct, intra-axial, or extra-axial fluid collection. Vascular: Intracranial atherosclerosis. Skull: No significant soft tissue swelling. No skull fracture. There are sclerotic lesions throughout the calvarium consistent without osseous metastasis. Sinuses/Orbits: Enlargement of extraocular muscles, consistent with history of hyperthyroidism. Mucosal thickening of ethmoid air cells and the left maxillary sinus. Clear mastoid air cells. Other: None. IMPRESSION: 1. No acute intracranial abnormality. 2. Cerebral/cerebellar atrophy and small vessel ischemic change. 3. Sclerotic lesions throughout the calvarium, consistent with osseous metastasis. 4. Sinus disease. Electronically Signed   By: Abigail Miyamoto M.D.   On: 03/26/2019 20:28   Dg Chest Port 1 View  Result Date: 03/26/2019 CLINICAL  DATA:  Fever EXAM: PORTABLE CHEST 1 VIEW COMPARISON:  10/15/2016 FINDINGS: Right-sided central venous port tip over the SVC. Normal heart size. No consolidation. Faint bilateral nodular foci of opacity, appears to project  over skeletal structures. Most likely represents skeletal metastatic disease. No pneumothorax. IMPRESSION: 1. No acute airspace disease 2. Bilateral nodular foci over the chest, appear to correspond to sclerotic osseous lesions/skeletal metastatic disease. Electronically Signed   By: Donavan Foil M.D.   On: 03/26/2019 20:44    Procedures Procedures (including critical care time)  Medications Ordered in ED Medications  acetaminophen (TYLENOL) tablet 500 mg (500 mg Oral Given 03/26/19 2159)  vancomycin (VANCOCIN) 2,000 mg in sodium chloride 0.9 % 500 mL IVPB (2,000 mg Intravenous New Bag/Given 03/26/19 2326)  ceFEPIme (MAXIPIME) 2 g in sodium chloride 0.9 % 100 mL IVPB (has no administration in time range)  vancomycin (VANCOCIN) IVPB 1000 mg/200 mL premix (has no administration in time range)  sodium chloride flush (NS) 0.9 % injection 10-40 mL (10 mLs Intracatheter Given 03/26/19 2215)  sodium chloride flush (NS) 0.9 % injection 10-40 mL (has no administration in time range)  0.9 %  sodium chloride infusion ( Intravenous New Bag/Given 03/26/19 2353)  lactated ringers bolus 1,000 mL (0 mLs Intravenous Stopped 03/26/19 2215)  ceFEPIme (MAXIPIME) 2 g in sodium chloride 0.9 % 100 mL IVPB (0 g Intravenous Stopped 03/26/19 2326)  bacitracin ointment ( Topical Given 03/26/19 2353)     Initial Impression / Assessment and Plan / ED Course  I have reviewed the triage vital signs and the nursing notes.  Pertinent labs & imaging results that were available during my care of the patient were reviewed by me and considered in my medical decision making (see chart for details).        Febrile on presentation with concerns of progressive fatigue. Pale on exam, concern for anemia. Non-focal  neurologic exam with 5/5 strength in bilateral upper and lower extremities. Concern for infectious etiology for patient's fatigue vs electrolyte or other metabolic disturbance. Patient reports recent poor PO intake. Benign abdominal exam. Blood cultures and urine culture pending. Empirically treated with cefepime and vancomycin. Tylenol given for fever. 1 L IVF given. Patient found to be hyponatremic with a sodium of 125 and anemic with Hb of 7.4. A combination of these is likely contributing to patient's fatigue. No signs of active bleeding.  CT of head obtained due to recent fall which showed no acute intracranial abnormality. Chest xray with no acute airspace disease. Unclear source of patient's fever.  Patient admitted to the hospitalist service for further evaluation and management of his fatigue and hyponatremia.   Patient seen and plan discussed with Dr. Ashok Cordia.   Final Clinical Impressions(s) / ED Diagnoses   Final diagnoses:  Hyponatremia  Fatigue, unspecified type  Anemia, unspecified type    ED Discharge Orders    None       Betsey Amen, MD 03/27/19 Berniece Salines    Lajean Saver, MD 03/30/19 [phone]

## 2019-03-27 ENCOUNTER — Telehealth: Payer: Self-pay | Admitting: *Deleted

## 2019-03-27 ENCOUNTER — Encounter (HOSPITAL_COMMUNITY): Payer: Self-pay

## 2019-03-27 DIAGNOSIS — R531 Weakness: Secondary | ICD-10-CM

## 2019-03-27 DIAGNOSIS — I10 Essential (primary) hypertension: Secondary | ICD-10-CM | POA: Diagnosis present

## 2019-03-27 LAB — CBC WITH DIFFERENTIAL/PLATELET
Abs Immature Granulocytes: 0.06 10*3/uL (ref 0.00–0.07)
Basophils Absolute: 0 10*3/uL (ref 0.0–0.1)
Basophils Relative: 0 %
Eosinophils Absolute: 0 10*3/uL (ref 0.0–0.5)
Eosinophils Relative: 0 %
HCT: 20 % — ABNORMAL LOW (ref 39.0–52.0)
Hemoglobin: 6.7 g/dL — CL (ref 13.0–17.0)
Immature Granulocytes: 1 %
Lymphocytes Relative: 4 %
Lymphs Abs: 0.3 10*3/uL — ABNORMAL LOW (ref 0.7–4.0)
MCH: 31.5 pg (ref 26.0–34.0)
MCHC: 33.5 g/dL (ref 30.0–36.0)
MCV: 93.9 fL (ref 80.0–100.0)
Monocytes Absolute: 0.6 10*3/uL (ref 0.1–1.0)
Monocytes Relative: 9 %
Neutro Abs: 5.8 10*3/uL (ref 1.7–7.7)
Neutrophils Relative %: 86 %
Platelets: 133 10*3/uL — ABNORMAL LOW (ref 150–400)
RBC: 2.13 MIL/uL — ABNORMAL LOW (ref 4.22–5.81)
RDW: 17.3 % — ABNORMAL HIGH (ref 11.5–15.5)
WBC: 6.7 10*3/uL (ref 4.0–10.5)
nRBC: 0 % (ref 0.0–0.2)

## 2019-03-27 LAB — BASIC METABOLIC PANEL
Anion gap: 8 (ref 5–15)
BUN: 13 mg/dL (ref 8–23)
CO2: 23 mmol/L (ref 22–32)
Calcium: 7.9 mg/dL — ABNORMAL LOW (ref 8.9–10.3)
Chloride: 97 mmol/L — ABNORMAL LOW (ref 98–111)
Creatinine, Ser: 0.77 mg/dL (ref 0.61–1.24)
GFR calc Af Amer: >60 mL/min (ref >60)
GFR calc non Af Amer: >60 mL/min (ref >60)
Glucose, Bld: 121 mg/dL — ABNORMAL HIGH (ref 70–99)
Potassium: 4.1 mmol/L (ref 3.5–5.1)
Sodium: 128 mmol/L — ABNORMAL LOW (ref 135–145)

## 2019-03-27 LAB — CBC
HCT: 22 % — ABNORMAL LOW (ref 39.0–52.0)
Hemoglobin: 7 g/dL — ABNORMAL LOW (ref 13.0–17.0)
MCH: 31.8 pg (ref 26.0–34.0)
MCHC: 31.8 g/dL (ref 30.0–36.0)
MCV: 100 fL (ref 80.0–100.0)
Platelets: 134 10*3/uL — ABNORMAL LOW (ref 150–400)
RBC: 2.2 MIL/uL — ABNORMAL LOW (ref 4.22–5.81)
RDW: 17.7 % — ABNORMAL HIGH (ref 11.5–15.5)
WBC: 6.8 10*3/uL (ref 4.0–10.5)
nRBC: 0 % (ref 0.0–0.2)

## 2019-03-27 LAB — PREPARE RBC (CROSSMATCH)

## 2019-03-27 LAB — LACTIC ACID, PLASMA
Lactic Acid, Venous: 1 mmol/L (ref 0.5–1.9)
Lactic Acid, Venous: 1 mmol/L (ref 0.5–1.9)

## 2019-03-27 LAB — OSMOLALITY: Osmolality: 266 mOsm/kg — ABNORMAL LOW (ref 275–295)

## 2019-03-27 LAB — OSMOLALITY, URINE: Osmolality, Ur: 559 mOsm/kg (ref 300–900)

## 2019-03-27 LAB — SODIUM, URINE, RANDOM: Sodium, Ur: 59 mmol/L

## 2019-03-27 LAB — PROCALCITONIN: Procalcitonin: 0.1 ng/mL

## 2019-03-27 MED ORDER — POLYETHYLENE GLYCOL 3350 17 G PO PACK
17.0000 g | PACK | Freq: Every day | ORAL | Status: DC | PRN
  Administered 2019-03-28: 15:00:00 17 g via ORAL
  Filled 2019-03-27: qty 1

## 2019-03-27 MED ORDER — ENSURE ENLIVE PO LIQD
237.0000 mL | Freq: Two times a day (BID) | ORAL | Status: DC
  Administered 2019-03-27 – 2019-03-29 (×4): 237 mL via ORAL

## 2019-03-27 MED ORDER — ADULT MULTIVITAMIN W/MINERALS CH
1.0000 | ORAL_TABLET | Freq: Every day | ORAL | Status: DC
  Administered 2019-03-27 – 2019-03-29 (×3): 1 via ORAL
  Filled 2019-03-27 (×3): qty 1

## 2019-03-27 MED ORDER — LEVOTHYROXINE SODIUM 25 MCG PO TABS: 125.0000 ug | ORAL_TABLET | ORAL | Status: DC

## 2019-03-27 MED ORDER — LEVOTHYROXINE SODIUM 112 MCG PO TABS: 112.0000 ug | ORAL_TABLET | ORAL | Status: DC

## 2019-03-27 MED ORDER — POLYVINYL ALCOHOL 1.4 % OP SOLN
1.0000 [drp] | OPHTHALMIC | Status: DC | PRN
  Administered 2019-03-27: 14:00:00 1 [drp] via OPHTHALMIC
  Filled 2019-03-27: qty 15

## 2019-03-27 MED ORDER — LEVOTHYROXINE SODIUM 112 MCG PO TABS
112.0000 ug | ORAL_TABLET | ORAL | Status: DC
  Administered 2019-03-28: 06:00:00 112 ug via ORAL
  Filled 2019-03-27 (×2): qty 1

## 2019-03-27 MED ORDER — HYDRALAZINE HCL 20 MG/ML IJ SOLN: 5.0000 mg | INTRAMUSCULAR | Status: DC | PRN

## 2019-03-27 MED ORDER — LIDOCAINE-PRILOCAINE 2.5-2.5 % EX CREA: 1.0000 "application " | TOPICAL_CREAM | CUTANEOUS | Status: DC | PRN

## 2019-03-27 MED ORDER — ENOXAPARIN SODIUM 40 MG/0.4ML ~~LOC~~ SOLN
40.0000 mg | SUBCUTANEOUS | Status: DC
  Administered 2019-03-27 – 2019-03-28 (×2): 40 mg via SUBCUTANEOUS
  Filled 2019-03-27 (×2): qty 0.4

## 2019-03-27 MED ORDER — AZELASTINE HCL 0.1 % NA SOLN: 1.0000 | Freq: Every day | NASAL | Status: DC | PRN

## 2019-03-27 MED ORDER — LEVOTHYROXINE SODIUM 25 MCG PO TABS
125.0000 ug | ORAL_TABLET | ORAL | Status: AC
  Administered 2019-03-27: 125 ug via ORAL
  Filled 2019-03-27: qty 1

## 2019-03-27 MED ORDER — LORATADINE 10 MG PO TABS
10.0000 mg | ORAL_TABLET | Freq: Every day | ORAL | Status: DC | PRN
  Administered 2019-03-27: 18:00:00 10 mg via ORAL
  Filled 2019-03-27: qty 1

## 2019-03-27 MED ORDER — LEVOTHYROXINE SODIUM 25 MCG PO TABS
125.0000 ug | ORAL_TABLET | ORAL | Status: DC
  Administered 2019-03-29: 06:00:00 125 ug via ORAL
  Filled 2019-03-27: qty 1

## 2019-03-27 MED ORDER — SODIUM CHLORIDE 0.9% IV SOLUTION
Freq: Once | INTRAVENOUS | Status: AC
  Administered 2019-03-27: 15:00:00 via INTRAVENOUS

## 2019-03-27 MED ORDER — ONDANSETRON HCL 4 MG/2ML IJ SOLN: 4.0000 mg | Freq: Four times a day (QID) | INTRAMUSCULAR | Status: DC | PRN

## 2019-03-27 MED ORDER — MEGESTROL ACETATE 400 MG/10ML PO SUSP
400.0000 mg | Freq: Every day | ORAL | Status: DC
  Administered 2019-03-27 – 2019-03-29 (×3): 400 mg via ORAL
  Filled 2019-03-27 (×4): qty 10

## 2019-03-27 MED ORDER — FLUTICASONE PROPIONATE 50 MCG/ACT NA SUSP: 1.0000 | Freq: Every day | NASAL | Status: DC | PRN

## 2019-03-27 MED ORDER — ONDANSETRON HCL 4 MG PO TABS: 4.0000 mg | ORAL_TABLET | Freq: Four times a day (QID) | ORAL | Status: DC | PRN

## 2019-03-27 NOTE — Progress Notes (Signed)
Notified by IV team vancomycin infiltrated. Notified Dr. Doristine Bosworth, no new orders at this time. Ice compress applied. Right AC site swollen and red at this time. Nurse will continue to monitor.

## 2019-03-27 NOTE — Progress Notes (Signed)
Patient refused use of CPAP for the evening. Will continue to monitor the patient

## 2019-03-27 NOTE — ED Notes (Signed)
Tele breakfast ordered  

## 2019-03-27 NOTE — Telephone Encounter (Signed)
[facility] at [facility] faxed prior authorization request for Megestrol Acetate 40 MG/ML SUSP.  Request to [employer] letter tray receptacle of Prior Authorization requests and forms for review.

## 2019-03-27 NOTE — Progress Notes (Signed)
CRITICAL VALUE STICKER  CRITICAL VALUE: Hgb 6.7  RECEIVER (on-site recipient of call): Jordan Hawks RN  DATE & TIME NOTIFIED: 03/27/19 14:47  MESSENGER (representative from lab):  MD NOTIFIED: [place]: 14:50  RESPONSE: continue current careplan

## 2019-03-27 NOTE — Progress Notes (Signed)
Events overnight noted.  Patient known to me with advanced stage prostate cancer that has progressed on multiple oral therapies.  Please see my previous note regarding his previous history and treatment plan for the future.  He was hospitalized overnight for failure to thrive and weakness.  His work-up is currently ongoing with potential underlying infection versus residual long-term effects from chemotherapy.  Laboratory data were reviewed which showed a hemoglobin of 7.0 with normal white cell count and platelet count of 134.  His urine and blood culture are currently pending.  I recommend continued supportive management like you are doing.  He is not currently undergoing any active treatment for his cancer and I do not feel this is a consequences of any active treatment he is receiving.  I do recommend proceeding with packed red cell transfusion which would help with improving his energy.  Physical therapy might be helpful to improve his mobility prior to discharge.

## 2019-03-27 NOTE — Progress Notes (Signed)
Initial Nutrition Assessment  DOCUMENTATION CODES:   Not applicable  INTERVENTION:  Provide Ensure Enlive po BID, each supplement provides 350 kcal and 20 grams of protein  Encourage adequate PO intake.   NUTRITION DIAGNOSIS:   Increased nutrient needs related to cancer and cancer related treatments as evidenced by estimated needs.  GOAL:   Patient will meet greater than or equal to 90% of their needs  MONITOR:   PO intake, Supplement acceptance, Skin, Weight trends, Labs, I & O's  REASON FOR ASSESSMENT:   Malnutrition Screening Tool    ASSESSMENT:   83 y.o. male with medical history significant of hypertension, GERD, hypothyroidism, metastasized prostate cancer (s/p of radiation, chemo and surgery), who presented with generalized weakness and fever.  Pt unavailable during attempted time of contact. RD unable to obtain pt nutrition history at this time. Per weight records, pt with a 6% weight loss in 5 months, no found significant for time frame however. RD to order nutritional supplements to aid in caloric and protein needs.   Unable to complete Nutrition-Focused physical exam at this time.   Labs and medications reviewed.   Diet Order:   Diet Order            Diet Heart Room service appropriate? Yes; Fluid consistency: Thin  Diet effective now              EDUCATION NEEDS:   Not appropriate for education at this time  Skin:  Skin Assessment: Reviewed RN Assessment  Last BM:  Unknown  Height:   Ht Readings from Last 1 Encounters:  03/26/19 6\' 2"  (1.88 m)    Weight:   Wt Readings from Last 1 Encounters:  03/26/19 93 kg    Ideal Body Weight:  86.36 kg  BMI:  Body mass index is 26.32 kg/m.  Estimated Nutritional Needs:   Kcal:  2150-2350  Protein:  105-120 grams  Fluid:  >/= 2 L/day    Corrin Parker, MS, RD, LDN Pager # [phone] After hours/ weekend pager # [phone]

## 2019-03-27 NOTE — Progress Notes (Signed)
Received PIV consult: Pt receiving vancomycin, will need transfusion. PIV in R AC infiltrated/ removed by this RN. Port accessed but not in use.  Cap and tubing changed, vancomycin switched to port. Discussed with Merleen Nicely, RN: instructed her to apply cold compress to site for first 24 hours.   Recommend using port for transfusion once antibiotic is complete.  Also, pt should be RN draw for lab (from port) to preserve his veins.

## 2019-03-27 NOTE — Progress Notes (Signed)
PT Cancellation Note  Patient Details Name: Edward Bailey MRN: [id number] DOB: [birth year]   Cancelled Treatment:    Reason Eval/Treat Not Completed: Other (comment) RN requested PT to wait as she Is about to start some blood. Will follow.   Marguarite Arbour A Laritza Vokes 03/27/2019, 2:07 PM Wray Kearns, PT, DPT Acute Rehabilitation Services Pager [phone] Office [phone]

## 2019-03-27 NOTE — Progress Notes (Signed)
PROGRESS NOTE    Edward Bailey  [id number] DOB: 24-Aug-1935 DOA: 03/26/2019 PCP: [name], MD   Brief Narrative:  Edward Bailey is a 83 y.o. male with medical history significant of hypertension, GERD, hypothyroidism, metastasized prostate cancer (s/p of radiation, chemo and surgery), who presented with generalized weakness and fever.  Patient reports he stopped chemotherapy for his prostate cancer 7 weeks ago.Siince then he has been having progressive fatigue and weakness. He states that normally he is able to walk and get in and out of the car without difficulty, but in the last several days he feels difficult to do that.  Patient denies subjective fever and chills, but his temperature was 100.8 in ED.  Patient has poor appetite and decreased oral intake.  He denies chest pain, shortness of breath, cough.  No nausea vomiting, diarrhea, abdominal pain, symptoms of UTI, unilateral numbness.  No facial droop or slurred speech. Patient reports a recent fall from sitting during which he hit his head but did not lose consciousness.  ED Course: pt was found to have WBC 7.5, negative COVID-19 test, sodium 125, TSH 4.756, free T4 1.46, renal function normal, lactic acid 1.9, INR 1.3, PTT 36, temperature 100.8, blood pressure 190/98, 116/57, tachycardia, tachypnea, oxygen saturation 96% on room air.  Chest x-ray negative. CT-head is negative for acute intracranial abnormalities, but showed osseous metastasis. Patient is admitted to telemetry bed as inpatient.  Assessment & Plan:   Principal Problem:   Sepsis ([place]) Active Problems:   Prostate cancer metastatic to bone (HCC)   Hyponatremia   Hypothyroidism   Fall   Generalized weakness   HTN (hypertension)   Generalized weakness: Likely secondary to poor oral intake.  No other etiology found.  Doubt infection since procalcitonin is unremarkable and no leukocytosis and has only low-grade fever.  PT consulted.  Will transfuse 1 unit  of PRBC for hemoglobin of 7 per oncology recommendations.  SIRS: Does not meet sepsis criteria since there is no known infection.  Came in with tachycardia and tachypnea and low-grade fever.  We will continue current antibiotics and follow blood culture and tailor antibiotics accordingly however with having unremarkable procalcitonin, I doubt infection.  Acute on chronic anemia of chronic disease: With normal MCV, this is likely anemia of chronic disease.  Hemoglobin was 11.8 on 08/19/2018 and 7.4 upon admission on 03/26/2019 which dropped to 7.0.  Per oncology recommendation, will transfuse him 1 unit which might help bring his energy back as well.  History of prostate cancer status post chemotherapy: Consulted his oncologist Dr. Alen Blew and I appreciate prompt note from him today.  No further recommendations about prostate cancer as this is not being actively treated but will transfuse him per oncology recommendation.  Essential hypertension: Blood pressure stable.  Continue to hold antihypertensives.  Continue PRN hydralazine.  Acute hyponatremia: Due to hypovolemia and poor oral intake..  Came in with 125.  Improved to 128.  He is alert and oriented.  Continue IV hydration.  Hypothyroidism: TSH only slightly elevated.  Continue home dose of Synthroid.  OSA: Continue CPAP.  DVT prophylaxis: Lovenox Code Status: Full code Family Communication:  None present at bedside.  Plan of care discussed with patient in length and he verbalized understanding and agreed with it.  I then spoke to his wife over the phone who also agreed with the plan. Disposition Plan: TBD  Consultants:   Oncology  Procedures:   None  Antimicrobials:   IV cefepime and vancomycin  Subjective: Patient seen and examined.  Continues to complain of weakness but feels a little bit better.  Denied any abdominal pain, nausea, fever, chills, chest pain, shortness of breath or urinary complaints.  Objective: Vitals:    03/27/19 0711 03/27/19 0712 03/27/19 0903 03/27/19 1200  BP:   (!) 124/58 (!) 118/58  Pulse: 89 87 89 84  Resp:   15 15  Temp:   98.6 F (37 C) 98.1 F (36.7 C)  TempSrc:   Oral Oral  SpO2: 96% 97% 97% 97%  Weight:      Height:        Intake/Output Summary (Last 24 hours) at 03/27/2019 1301 Last data filed at 03/26/2019 2326 Gross per 24 hour  Intake 1100 ml  Output --  Net 1100 ml   Filed Weights   03/26/19 2326  Weight: 93 kg    Examination:  General exam: Appears calm and comfortable but looks pale. Respiratory system: Clear to auscultation. Respiratory effort normal. Cardiovascular system: S1 & S2 heard, RRR. No JVD, murmurs, rubs, gallops or clicks. No pedal edema. Gastrointestinal system: Abdomen is nondistended, soft and nontender. No organomegaly or masses felt. Normal bowel sounds heard. Central nervous system: Alert and oriented. No focal neurological deficits. Extremities: Symmetric 5 x 5 power. Skin: No rashes, lesions or ulcers Psychiatry: Judgement and insight appear normal. Mood & affect appropriate.    Data Reviewed: I have personally reviewed following labs and imaging studies  CBC: Recent Labs  Lab 03/26/19 1944 03/27/19 0750  WBC 7.5 6.8  NEUTROABS 6.4  --   HGB 7.4* 7.0*  HCT 22.4* 22.0*  MCV 94.9 100.0  PLT 148* [id number]*   Basic Metabolic Panel: Recent Labs  Lab 03/26/19 1944 03/27/19 0750  NA 125* 128*  K 4.5 4.1  CL 93* 97*  CO2 22 23  GLUCOSE 147* 121*  BUN 18 13  CREATININE 0.90 0.77  CALCIUM 8.2* 7.9*   GFR: Estimated Creatinine Clearance: 84.2 mL/min (by C-G formula based on SCr of 0.77 mg/dL). Liver Function Tests: Recent Labs  Lab 03/26/19 1944  AST 53*  ALT 11  ALKPHOS 64  BILITOT 0.5  PROT 5.6*  ALBUMIN 2.8*   No results for input(s): LIPASE, AMYLASE in the last 168 hours. No results for input(s): AMMONIA in the last 168 hours. Coagulation Profile: Recent Labs  Lab 03/26/19 1944  INR 1.3*   Cardiac  Enzymes: No results for input(s): CKTOTAL, CKMB, CKMBINDEX, TROPONINI in the last 168 hours. BNP (last 3 results) No results for input(s): PROBNP in the last 8760 hours. HbA1C: No results for input(s): HGBA1C in the last 72 hours. CBG: No results for input(s): GLUCAP in the last 168 hours. Lipid Profile: No results for input(s): CHOL, HDL, LDLCALC, TRIG, CHOLHDL, LDLDIRECT in the last 72 hours. Thyroid Function Tests: Recent Labs    03/26/19 1945  TSH 4.756*  FREET4 1.46*   Anemia Panel: No results for input(s): VITAMINB12, FOLATE, FERRITIN, TIBC, IRON, RETICCTPCT in the last 72 hours. Sepsis Labs: Recent Labs  Lab 03/26/19 1944 03/26/19 2156 03/27/19 0750 03/27/19 0907  PROCALCITON  --   --  0.10  --   LATICACIDVEN 1.9 1.0 1.0 1.0    Recent Results (from the past 240 hour(s))  Blood Culture (routine x 2)     Status: None (Preliminary result)   Collection Time: 03/26/19  7:47 PM   Specimen: BLOOD  Result Value Ref Range Status   Specimen Description BLOOD RIGHT ANTECUBITAL  Final   Special  Requests   Final    BOTTLES DRAWN AEROBIC AND ANAEROBIC Blood Culture adequate volume   Culture   Final    NO GROWTH < 24 HOURS Performed at [facility], [place] [address], [place], [place] [address]    Report Status PENDING  Incomplete  SARS Coronavirus 2 [facility] order, Performed in [facility] hospital lab) Nasopharyngeal Urine, Clean Catch     Status: None   Collection Time: 03/26/19 10:00 PM   Specimen: Urine, Clean Catch; Nasopharyngeal  Result Value Ref Range Status   SARS Coronavirus 2 NEGATIVE NEGATIVE Final    Comment: (NOTE) If result is NEGATIVE SARS-CoV-2 target nucleic acids are NOT DETECTED. The SARS-CoV-2 RNA is generally detectable in upper and lower  respiratory specimens during the acute phase of infection. The lowest  concentration of SARS-CoV-2 viral copies this assay can detect is 250  copies / mL. A negative result does not preclude SARS-CoV-2  infection  and should not be used as the sole basis for treatment or other  patient management decisions.  A negative result may occur with  improper specimen collection / handling, submission of specimen other  than nasopharyngeal swab, presence of viral mutation(s) within the  areas targeted by this assay, and inadequate number of viral copies  (<250 copies / mL). A negative result must be combined with clinical  observations, patient history, and epidemiological information. If result is POSITIVE SARS-CoV-2 target nucleic acids are DETECTED. The SARS-CoV-2 RNA is generally detectable in upper and lower  respiratory specimens dur ing the acute phase of infection.  Positive  results are indicative of active infection with SARS-CoV-2.  Clinical  correlation with patient history and other diagnostic information is  necessary to determine patient infection status.  Positive results do  not rule out bacterial infection or co-infection with other viruses. If result is PRESUMPTIVE POSTIVE SARS-CoV-2 nucleic acids MAY BE PRESENT.   A presumptive positive result was obtained on the submitted specimen  and confirmed on repeat testing.  While 2019 novel coronavirus  (SARS-CoV-2) nucleic acids may be present in the submitted sample  additional confirmatory testing may be necessary for epidemiological  and / or clinical management purposes  to differentiate between  SARS-CoV-2 and other Sarbecovirus currently known to infect humans.  If clinically indicated additional testing with an alternate test  methodology [phone]) is advised. The SARS-CoV-2 RNA is generally  detectable in upper and lower respiratory sp ecimens during the acute  phase of infection. The expected result is Negative. Fact Sheet for Patients:  StrictlyIdeas.no Fact Sheet for Healthcare Providers: BankingDealers.co.za This test is not yet approved or cleared by the [place]  FDA and has been authorized for detection and/or diagnosis of SARS-CoV-2 by FDA under an Emergency Use Authorization (EUA).  This EUA will remain in effect (meaning this test can be used) for the duration of the COVID-19 declaration under Section 564(b)(1) of the Act, 21 U.S.C. section 360bbb-3(b)(1), unless the authorization is terminated or revoked sooner. Performed at [facility], [place] [address] [place], [place] [address]   Blood Culture (routine x 2)     Status: None (Preliminary result)   Collection Time: 03/26/19 10:24 PM   Specimen: BLOOD  Result Value Ref Range Status   Specimen Description BLOOD LEFT ANTECUBITAL  Final   Special Requests   Final    BOTTLES DRAWN AEROBIC AND ANAEROBIC Blood Culture adequate volume   Culture   Final    NO GROWTH < 24 HOURS  Performed at [facility], [place] [address], [place], [place] [address]    Report Status PENDING  Incomplete      Radiology Studies: Ct Head Wo Contrast  Result Date: 03/26/2019 CLINICAL DATA:  Ataxia. Weakness. Prostate cancer with bone metastasis. EXAM: CT HEAD WITHOUT CONTRAST TECHNIQUE: Contiguous axial images were obtained from the base of the skull through the vertex without intravenous contrast. COMPARISON:  02/20/2018 brain MR. FINDINGS: Brain: Cerebral and cerebellar atrophy, felt to be age appropriate. Moderate low density in the periventricular white matter likely related to small vessel disease. No mass lesion, hemorrhage, hydrocephalus, acute infarct, intra-axial, or extra-axial fluid collection. Vascular: Intracranial atherosclerosis. Skull: No significant soft tissue swelling. No skull fracture. There are sclerotic lesions throughout the calvarium consistent without osseous metastasis. Sinuses/Orbits: Enlargement of extraocular muscles, consistent with history of hyperthyroidism. Mucosal thickening of ethmoid air cells and the left maxillary sinus. Clear mastoid air cells. Other: None. IMPRESSION: 1. No  acute intracranial abnormality. 2. Cerebral/cerebellar atrophy and small vessel ischemic change. 3. Sclerotic lesions throughout the calvarium, consistent with osseous metastasis. 4. Sinus disease. Electronically Signed   By: Abigail Miyamoto M.D.   On: 03/26/2019 20:28   Dg Chest Port 1 View  Result Date: 03/26/2019 CLINICAL DATA:  Fever EXAM: PORTABLE CHEST 1 VIEW COMPARISON:  10/15/2016 FINDINGS: Right-sided central venous port tip over the SVC. Normal heart size. No consolidation. Faint bilateral nodular foci of opacity, appears to project over skeletal structures. Most likely represents skeletal metastatic disease. No pneumothorax. IMPRESSION: 1. No acute airspace disease 2. Bilateral nodular foci over the chest, appear to correspond to sclerotic osseous lesions/skeletal metastatic disease. Electronically Signed   By: Donavan Foil M.D.   On: 03/26/2019 20:44    Scheduled Meds:  sodium chloride   Intravenous Once   enoxaparin (LOVENOX) injection  40 mg Subcutaneous Q24H   [START ON 03/28/2019] levothyroxine  112 mcg Oral Q48H   [START ON 03/29/2019] levothyroxine  125 mcg Oral Q48H   megestrol  400 mg Oral Daily   multivitamin with minerals  1 tablet Oral Q breakfast   sodium chloride flush  10-40 mL Intracatheter Q12H   Continuous Infusions:  sodium chloride 75 mL/hr at 03/27/19 1142   ceFEPime (MAXIPIME) IV 2 g (03/27/19 1149)   vancomycin       LOS: 1 day   Time spent: 40 minutes   Darliss Cheney, MD [facility] Pager [phone]  If 7PM-7AM, please contact night-coverage [email or web address] Password [email or web address] 03/27/2019, 1:01 PM

## 2019-03-27 NOTE — Progress Notes (Signed)
Pt. Arrived from ED. Alert and oriented x4. Skin intact, tele placed.  Pt. Oriented to unit and room. Call bell within reach, bed in lowest position, alarn activated. Wife given update.  Lillyana Majette A Katty Fretwell

## 2019-03-27 NOTE — Telephone Encounter (Signed)
"  Dr. Deretha Emory, hospitalist with inpatient referral for Edward Bailey in [facility] [place] room 9.  Prefer routine visit today.  Will on-call provider or established provider see patient today?"  Current [facility] provider notified.

## 2019-03-28 LAB — CBC WITH DIFFERENTIAL/PLATELET
Abs Immature Granulocytes: 0.06 10*3/uL (ref 0.00–0.07)
Basophils Absolute: 0 10*3/uL (ref 0.0–0.1)
Basophils Relative: 0 %
Eosinophils Absolute: 0 10*3/uL (ref 0.0–0.5)
Eosinophils Relative: 1 %
HCT: 25.3 % — ABNORMAL LOW (ref 39.0–52.0)
Hemoglobin: 8.5 g/dL — ABNORMAL LOW (ref 13.0–17.0)
Immature Granulocytes: 1 %
Lymphocytes Relative: 3 %
Lymphs Abs: 0.2 10*3/uL — ABNORMAL LOW (ref 0.7–4.0)
MCH: 30.8 pg (ref 26.0–34.0)
MCHC: 33.6 g/dL (ref 30.0–36.0)
MCV: 91.7 fL (ref 80.0–100.0)
Monocytes Absolute: 0.6 10*3/uL (ref 0.1–1.0)
Monocytes Relative: 9 %
Neutro Abs: 5.6 10*3/uL (ref 1.7–7.7)
Neutrophils Relative %: 86 %
Platelets: 121 10*3/uL — ABNORMAL LOW (ref 150–400)
RBC: 2.76 MIL/uL — ABNORMAL LOW (ref 4.22–5.81)
RDW: 17.5 % — ABNORMAL HIGH (ref 11.5–15.5)
WBC: 6.5 10*3/uL (ref 4.0–10.5)
nRBC: 0 % (ref 0.0–0.2)

## 2019-03-28 LAB — COMPREHENSIVE METABOLIC PANEL
ALT: 15 U/L (ref 0–44)
AST: 43 U/L — ABNORMAL HIGH (ref 15–41)
Albumin: 2.3 g/dL — ABNORMAL LOW (ref 3.5–5.0)
Alkaline Phosphatase: 62 U/L (ref 38–126)
Anion gap: 9 (ref 5–15)
BUN: 11 mg/dL (ref 8–23)
CO2: 20 mmol/L — ABNORMAL LOW (ref 22–32)
Calcium: 7.6 mg/dL — ABNORMAL LOW (ref 8.9–10.3)
Chloride: 95 mmol/L — ABNORMAL LOW (ref 98–111)
Creatinine, Ser: 0.68 mg/dL (ref 0.61–1.24)
GFR calc Af Amer: >60 mL/min (ref >60)
GFR calc non Af Amer: >60 mL/min (ref >60)
Glucose, Bld: 110 mg/dL — ABNORMAL HIGH (ref 70–99)
Potassium: 3.6 mmol/L (ref 3.5–5.1)
Sodium: 124 mmol/L — ABNORMAL LOW (ref 135–145)
Total Bilirubin: 0.7 mg/dL (ref 0.3–1.2)
Total Protein: 5 g/dL — ABNORMAL LOW (ref 6.5–8.1)

## 2019-03-28 LAB — BPAM RBC
Blood Product Expiration Date: 202010102359
ISSUE DATE / TIME: 202009041537
Unit Type and Rh: 5100

## 2019-03-28 LAB — TYPE AND SCREEN
ABO/RH(D): O POS
Antibody Screen: NEGATIVE
Unit division: 0

## 2019-03-28 LAB — SODIUM
Sodium: 126 mmol/L — ABNORMAL LOW (ref 135–145)
Sodium: 127 mmol/L — ABNORMAL LOW (ref 135–145)

## 2019-03-28 NOTE — Evaluation (Signed)
Physical Therapy Evaluation Patient Details Name: Edward Bailey MRN: [id number] DOB: 1935/08/04 Today's Date: 03/28/2019   History of Present Illness  Pt is an 83 yo male s/p weakness and fever with difficulty getting in/out of car. PMHx: HTN.GERD, hypothyrodisim, mets and prostate CA.  Clinical Impression  Pt admitted with/for weakness and fever.  Pt with abnormal lab values and low Hgb.  Present mobilizing at a min guard level..  Pt currently limited functionally due to the problems listed below.  (see problems list.)  Pt will benefit from PT to maximize function and safety to be able to get home safely with available assist.     Follow Up Recommendations Home health PT;Supervision - Intermittent    Equipment Recommendations  Rolling walker with 5" wheels    Recommendations for Other Services       Precautions / Restrictions Precautions Precautions: Fall Restrictions Weight Bearing Restrictions: No      Mobility  Bed Mobility               General bed mobility comments: OOB in chair on arrival  Transfers Overall transfer level: Needs assistance Equipment used: Rolling walker (2 wheeled) Transfers: Sit to/from Stand Sit to Stand: Min guard            Ambulation/Gait Ambulation/Gait assistance: Min guard Gait Distance (Feet): 120 Feet Assistive device: Rolling walker (2 wheeled) Gait Pattern/deviations: Step-through pattern Gait velocity: slower and tentative   General Gait Details: generally steady with light use of the RW.  Needs cues for better use fo the RW with better positioning in the RW.  Stairs            Wheelchair Mobility    Modified Rankin (Stroke Patients Only)       Balance Overall balance assessment: Needs assistance Sitting-balance support: No upper extremity supported;Bilateral upper extremity supported Sitting balance-Leahy Scale: Good       Standing balance-Leahy Scale: Fair                                Pertinent Vitals/Pain Pain Assessment: No/denies pain    Home Living Family/patient expects to be discharged to:: Private residence Living Arrangements: Spouse/significant other Available Help at Discharge: Family;Available 24 hours/day Type of Home: House Home Access: Stairs to enter Entrance Stairs-Rails: Can reach both Entrance Stairs-Number of Steps: 9 Home Layout: One level Home Equipment: Cane - single point      Prior Function Level of Independence: Independent with assistive device(s)               Hand Dominance        Extremity/Trunk Assessment   Upper Extremity Assessment Upper Extremity Assessment: Defer to OT evaluation    Lower Extremity Assessment Lower Extremity Assessment: RLE deficits/detail;LLE deficits/detail RLE Deficits / Details: hip flexors 3/5, hams 3+, quads >=4/5, df/pf WFL LLE Deficits / Details: hip flexors 3/5, quads >=4/5, hams 3+, df/pf WFL       Communication   Communication: No difficulties  Cognition Arousal/Alertness: Awake/alert Behavior During Therapy: WFL for tasks assessed/performed Overall Cognitive Status: Within Functional Limits for tasks assessed                                        General Comments      Exercises     Assessment/Plan    PT Assessment  Patient needs continued PT services  PT Problem List Decreased strength;Decreased activity tolerance;Decreased balance;Decreased knowledge of use of DME;Decreased mobility       PT Treatment Interventions DME instruction;Gait training;Stair training;Functional mobility training;Therapeutic activities;Balance training;Patient/family education    PT Goals (Current goals can be found in the Care Plan section)  Acute Rehab PT Goals Patient Stated Goal: to go home PT Goal Formulation: With patient/family Time For Goal Achievement: 04/04/19 Potential to Achieve Goals: Good    Frequency Min 3X/week   Barriers to discharge         Co-evaluation               AM-PAC PT "6 Clicks" Mobility  Outcome Measure Help needed turning from your back to your side while in a flat bed without using bedrails?: None Help needed moving from lying on your back to sitting on the side of a flat bed without using bedrails?: None Help needed moving to and from a bed to a chair (including a wheelchair)?: A Little Help needed standing up from a chair using your arms (e.g., wheelchair or bedside chair)?: A Little Help needed to walk in hospital room?: A Little Help needed climbing 3-5 steps with a railing? : A Little 6 Click Score: 20    End of Session   Activity Tolerance: Patient tolerated treatment well Patient left: in chair;with call bell/phone within reach;with chair alarm set;with family/visitor present Nurse Communication: Mobility status PT Visit Diagnosis: Unsteadiness on feet (R26.81);Difficulty in walking, not elsewhere classified (R26.2)    Time: [id number] PT Time Calculation (min) (ACUTE ONLY): 36 min   Charges:   PT Evaluation $PT Eval Moderate Complexity: 1 Mod PT Treatments $Gait Training: 8-22 mins        03/28/2019  Donnella Sham, PT Acute Rehabilitation Services [phone]  (pager) [phone]  (office)  Tessie Fass Marysue Fait 03/28/2019, 3:41 PM

## 2019-03-28 NOTE — Evaluation (Signed)
Occupational Therapy Evaluation Patient Details Name: Edward Bailey MRN: [id number] DOB: 1935/09/19 Today's Date: 03/28/2019    History of Present Illness Pt is an 83 yo male s/p weakness and fever with difficulty getting in/out of car. PMHx: HTN.GERD, hypothyrodisim, mets and prostate CA.   Clinical Impression   Pt admitted with weakness. Pt currently with functional limitiations due to the deficits in strength, mobility and poor activity tolerance. HR increases to 114 BPM with minimal exertion. MinA overall for ADL due to weakness and poor mobility. Pt minguardA for mobility in room. Spouse and pt agree [facility] therapies would be a great idea. Pt would benefit from continued acute OT.  Pt will benefit from skilled OT to increase their independence and safety with adls and balance to allow discharge with HHOT. OT following acutely.     Follow Up Recommendations  Home health OT;Supervision - Intermittent    Equipment Recommendations  Other (comment)(RW)    Recommendations for Other Services       Precautions / Restrictions Precautions Precautions: Fall Restrictions Weight Bearing Restrictions: No      Mobility Bed Mobility Overal bed mobility: Needs Assistance Bed Mobility: Supine to Sit     Supine to sit: Supervision     General bed mobility comments: OOB in chair on arrival  Transfers Overall transfer level: Needs assistance Equipment used: Rolling walker (2 wheeled) Transfers: Stand Pivot Transfers;Sit to/from Stand Sit to Stand: Min guard Stand pivot transfers: Min guard            Balance Overall balance assessment: Needs assistance   Sitting balance-Leahy Scale: Good       Standing balance-Leahy Scale: Fair                             ADL either performed or assessed with clinical judgement   ADL Overall ADL's : Needs assistance/impaired Eating/Feeding: Modified independent;Sitting   Grooming: Supervision/safety;Standing   Upper  Body Bathing: Set up;Sitting   Lower Body Bathing: Min guard;Sitting/lateral leans;Sit to/from stand   Upper Body Dressing : Set up;Sitting   Lower Body Dressing: Minimal assistance;Sitting/lateral leans;Sit to/from stand   Toilet Transfer: Min guard;Grab bars;Ambulation   Toileting- Clothing Manipulation and Hygiene: Minimal assistance;Sitting/lateral lean;Sit to/from stand       Functional mobility during ADLs: Min guard;Rolling walker General ADL Comments: MinA overall for ADL due to weakness and poor mobility.     Vision Baseline Vision/History: Wears glasses Wears Glasses: At all times Patient Visual Report: No change from baseline Vision Assessment?: No apparent visual deficits     Perception     Praxis      Pertinent Vitals/Pain Pain Assessment: No/denies pain     Hand Dominance Right   Extremity/Trunk Assessment Upper Extremity Assessment Upper Extremity Assessment: Defer to OT evaluation   Lower Extremity Assessment Lower Extremity Assessment: RLE deficits/detail;LLE deficits/detail RLE Deficits / Details: hip flexors 3/5, hams 3+, quads >=4/5, df/pf WFL LLE Deficits / Details: hip flexors 3/5, quads >=4/5, hams 3+, df/pf WFL   Cervical / Trunk Assessment Cervical / Trunk Assessment: Normal   Communication Communication Communication: No difficulties   Cognition Arousal/Alertness: Awake/alert Behavior During Therapy: WFL for tasks assessed/performed Overall Cognitive Status: Within Functional Limits for tasks assessed                                     General Comments  spouse present to conclude session- very interested in [facility] therapy    Exercises     Shoulder Instructions      Home Living Family/patient expects to be discharged to:: Private residence Living Arrangements: Spouse/significant other Available Help at Discharge: Family;Available 24 hours/day Type of Home: House Home Access: Stairs to enter [employer]  of Steps: 9 Entrance Stairs-Rails: Can reach both Home Layout: One level     Bathroom Shower/Tub: Occupational psychologist: Handicapped height     Home Equipment: Cane - single point          Prior Functioning/Environment Level of Independence: Independent with assistive device(s)                 OT Problem List: Decreased strength;Decreased activity tolerance;Impaired balance (sitting and/or standing);Decreased coordination;Decreased safety awareness      OT Treatment/Interventions: Self-care/ADL training;Therapeutic exercise;Neuromuscular education;Energy conservation;DME and/or AE instruction;Therapeutic activities;Patient/family education;Balance training    OT Goals(Current goals can be found in the care plan section) Acute Rehab OT Goals Patient Stated Goal: to go home OT Goal Formulation: With patient Time For Goal Achievement: 04/11/19 Potential to Achieve Goals: Good ADL Goals Pt Will Perform Grooming: with modified independence;sitting Pt Will Perform Lower Body Dressing: with supervision;sit to/from stand Additional ADL Goal #1: Pt will increase to supervisionA overall for OOB ADL.  OT Frequency: Min 2X/week   Barriers to D/C: Decreased caregiver support  spouse is only assist       Co-evaluation              AM-PAC OT "6 Clicks" Daily Activity     Outcome Measure Help from another person eating meals?: None Help from another person taking care of personal grooming?: A Little Help from another person toileting, which includes using toliet, bedpan, or urinal?: A Little Help from another person bathing (including washing, rinsing, drying)?: A Little Help from another person to put on and taking off regular upper body clothing?: None Help from another person to put on and taking off regular lower body clothing?: A Little 6 Click Score: 20   End of Session Equipment Utilized During Treatment: Gait belt;Rolling walker Nurse Communication:  Mobility status  Activity Tolerance: Patient tolerated treatment well Patient left: in chair;with call bell/phone within reach;with chair alarm set  OT Visit Diagnosis: Unsteadiness on feet (R26.81);Muscle weakness (generalized) (M62.81)                Time: [id number] OT Time Calculation (min): 44 min Charges:  OT General Charges $OT Visit: 1 Visit OT Evaluation $OT Eval Moderate Complexity: 1 Mod OT Treatments $Self Care/Home Management : 23-37 mins  Ebony Hail Harold Hedge) Marsa Aris OTR/L Acute Rehabilitation Services Pager: [phone] Office: [phone] 03/28/2019, 1:48 PM

## 2019-03-28 NOTE — Progress Notes (Signed)
PROGRESS NOTE    Edward Bailey  [id number] DOB: 11/10/1935 DOA: 03/26/2019 PCP: [name], MD   Brief Narrative:  Edward Bailey is a 83 y.o. male with medical history significant of hypertension, GERD, hypothyroidism, metastasized prostate cancer (s/p of radiation, chemo and surgery), who presented with generalized weakness and fever.  Patient reports he stopped chemotherapy for his prostate cancer 7 weeks ago.Siince then he has been having progressive fatigue and weakness. He states that normally he is able to walk and get in and out of the car without difficulty, but in the last several days he feels difficult to do that.  Patient denies subjective fever and chills, but his temperature was 100.8 in ED.  Patient has poor appetite and decreased oral intake.  He denies chest pain, shortness of breath, cough.  No nausea vomiting, diarrhea, abdominal pain, symptoms of UTI, unilateral numbness.  No facial droop or slurred speech. Patient reports a recent fall from sitting during which he hit his head but did not lose consciousness.  ED Course: pt was found to have WBC 7.5, negative COVID-19 test, sodium 125, TSH 4.756, free T4 1.46, renal function normal, lactic acid 1.9, INR 1.3, PTT 36, temperature 100.8, blood pressure 190/98, 116/57, tachycardia, tachypnea, oxygen saturation 96% on room air.  Chest x-ray negative. CT-head is negative for acute intracranial abnormalities, but showed osseous metastasis. Patient is admitted to telemetry bed as inpatient.  Assessment & Plan:   Principal Problem:   Sepsis ([place]) Active Problems:   Prostate cancer metastatic to bone (HCC)   Hyponatremia   Hypothyroidism   Fall   Generalized weakness   HTN (hypertension)   Generalized weakness: Likely secondary to poor oral intake.  No other etiology found.  Doubt infection since procalcitonin is unremarkable and no leukocytosis and has only low-grade fever.  PT consulted but has not seen the  patient yet.  Received 1 unit of PRBC yesterday.  He states that he feels better.  SIRS: Does not meet sepsis criteria since there is no known infection.  Came in with tachycardia and tachypnea and low-grade fever.  We will continue current antibiotics and follow blood culture and tailor antibiotics accordingly however with having unremarkable procalcitonin, I doubt infection.  Culture is negative thus far.  Acute on chronic anemia of chronic disease: With normal MCV, this is likely anemia of chronic disease.  Hemoglobin was 11.8 on 08/19/2018 and 7.4 upon admission on 03/26/2019 which dropped to 7.0.  Per oncology recommendation, he received 1 unit of PRBC yesterday.  History of prostate cancer status post chemotherapy: Consulted his oncologist Dr. Alen Blew and I appreciate prompt note from him today.  No further recommendations about prostate cancer as this is not being actively treated but will transfuse him per oncology recommendation.  Essential hypertension: Blood pressure stable.  Continue to hold antihypertensives.  Continue PRN hydralazine.  Acute hyponatremia/SIADH: Initially seemed like his hyponatremia was secondary to hypovolemia however labs indicate possible SIADH.  Stopped IV fluids this morning.  We will put him on fluid restriction and check sodium now and later tonight and tomorrow morning.  This might also contribute to generalized weakness.  Hypothyroidism: TSH only slightly elevated.  Continue home dose of Synthroid.  OSA: Continue CPAP per patient refused last night.  DVT prophylaxis: Lovenox Code Status: Full code Family Communication:  None present at bedside.  Plan of care discussed with patient in length and he verbalized understanding and agreed with it.   Disposition Plan: Potential discharge home  tomorrow.  Consultants:   Oncology  Procedures:   None  Antimicrobials:   IV cefepime and vancomycin   Subjective: Patient seen and examined this morning.  He  states that he feels better.  Weakness is better.  He has remained afebrile.  No other complaint.  Objective: Vitals:   03/28/19 0001 03/28/19 0400 03/28/19 0755 03/28/19 1206  BP: (!) 90/52 (!) 116/44 137/60 134/65  Pulse: 81 83 88 98  Resp: 17 17 16 16   Temp: 98.4 F (36.9 C) 98.2 F (36.8 C) 98.2 F (36.8 C) 97.7 F (36.5 C)  TempSrc: Oral Oral Oral Oral  SpO2: 96% 96% 97% 99%  Weight:      Height:        Intake/Output Summary (Last 24 hours) at 03/28/2019 1534 Last data filed at 03/28/2019 1343 Gross per 24 hour  Intake 1825 ml  Output 2800 ml  Net -975 ml   Filed Weights   03/26/19 2326  Weight: 93 kg    Examination:   General exam: Appears calm and comfortable, looks pale. Respiratory system: Clear to auscultation. Respiratory effort normal. Cardiovascular system: S1 & S2 heard, RRR. No JVD, murmurs, rubs, gallops or clicks. No pedal edema. Gastrointestinal system: Abdomen is nondistended, soft and nontender. No organomegaly or masses felt. Normal bowel sounds heard. Central nervous system: Alert and oriented. No focal neurological deficits. Extremities: Symmetric 5 x 5 power. Skin: No rashes, lesions or ulcers.  Psychiatry: Judgement and insight appear normal. Mood & affect appropriate.    Data Reviewed: I have personally reviewed following labs and imaging studies  CBC: Recent Labs  Lab 03/26/19 1944 03/27/19 0750 03/27/19 1413 03/28/19 0436  WBC 7.5 6.8 6.7 6.5  NEUTROABS 6.4  --  5.8 5.6  HGB 7.4* 7.0* 6.7* 8.5*  HCT 22.4* 22.0* 20.0* 25.3*  MCV 94.9 100.0 93.9 91.7  PLT 148* 134* 133* [id number]*   Basic Metabolic Panel: Recent Labs  Lab 03/26/19 1944 03/27/19 0750 03/28/19 0436  NA 125* 128* 124*  K 4.5 4.1 3.6  CL 93* 97* 95*  CO2 22 23 20*  GLUCOSE 147* 121* 110*  BUN 18 13 11   CREATININE 0.90 0.77 0.68  CALCIUM 8.2* 7.9* 7.6*   GFR: Estimated Creatinine Clearance: 84.2 mL/min (by C-G formula based on SCr of 0.68 mg/dL). Liver Function  Tests: Recent Labs  Lab 03/26/19 1944 03/28/19 0436  AST 53* 43*  ALT 11 15  ALKPHOS 64 62  BILITOT 0.5 0.7  PROT 5.6* 5.0*  ALBUMIN 2.8* 2.3*   No results for input(s): LIPASE, AMYLASE in the last 168 hours. No results for input(s): AMMONIA in the last 168 hours. Coagulation Profile: Recent Labs  Lab 03/26/19 1944  INR 1.3*   Cardiac Enzymes: No results for input(s): CKTOTAL, CKMB, CKMBINDEX, TROPONINI in the last 168 hours. BNP (last 3 results) No results for input(s): PROBNP in the last 8760 hours. HbA1C: No results for input(s): HGBA1C in the last 72 hours. CBG: No results for input(s): GLUCAP in the last 168 hours. Lipid Profile: No results for input(s): CHOL, HDL, LDLCALC, TRIG, CHOLHDL, LDLDIRECT in the last 72 hours. Thyroid Function Tests: Recent Labs    03/26/19 1945  TSH 4.756*  FREET4 1.46*   Anemia Panel: No results for input(s): VITAMINB12, FOLATE, FERRITIN, TIBC, IRON, RETICCTPCT in the last 72 hours. Sepsis Labs: Recent Labs  Lab 03/26/19 1944 03/26/19 2156 03/27/19 0750 03/27/19 0907  PROCALCITON  --   --  0.10  --   LATICACIDVEN 1.9  1.0 1.0 1.0    Recent Results (from the past 240 hour(s))  Blood Culture (routine x 2)     Status: None (Preliminary result)   Collection Time: 03/26/19  7:47 PM   Specimen: BLOOD  Result Value Ref Range Status   Specimen Description BLOOD RIGHT ANTECUBITAL  Final   Special Requests   Final    BOTTLES DRAWN AEROBIC AND ANAEROBIC Blood Culture adequate volume   Culture   Final    NO GROWTH 2 DAYS Performed at [facility], [address] [place], [place] [address]    Report Status PENDING  Incomplete  SARS Coronavirus 2 [facility] order, Performed in [facility] hospital lab) Nasopharyngeal Urine, Clean Catch     Status: None   Collection Time: 03/26/19 10:00 PM   Specimen: Urine, Clean Catch; Nasopharyngeal  Result Value Ref Range Status   SARS Coronavirus 2 NEGATIVE NEGATIVE Final    Comment:  (NOTE) If result is NEGATIVE SARS-CoV-2 target nucleic acids are NOT DETECTED. The SARS-CoV-2 RNA is generally detectable in upper and lower  respiratory specimens during the acute phase of infection. The lowest  concentration of SARS-CoV-2 viral copies this assay can detect is 250  copies / mL. A negative result does not preclude SARS-CoV-2 infection  and should not be used as the sole basis for treatment or other  patient management decisions.  A negative result may occur with  improper specimen collection / handling, submission of specimen other  than nasopharyngeal swab, presence of viral mutation(s) within the  areas targeted by this assay, and inadequate number of viral copies  (<250 copies / mL). A negative result must be combined with clinical  observations, patient history, and epidemiological information. If result is POSITIVE SARS-CoV-2 target nucleic acids are DETECTED. The SARS-CoV-2 RNA is generally detectable in upper and lower  respiratory specimens dur ing the acute phase of infection.  Positive  results are indicative of active infection with SARS-CoV-2.  Clinical  correlation with patient history and other diagnostic information is  necessary to determine patient infection status.  Positive results do  not rule out bacterial infection or co-infection with other viruses. If result is PRESUMPTIVE POSTIVE SARS-CoV-2 nucleic acids MAY BE PRESENT.   A presumptive positive result was obtained on the submitted specimen  and confirmed on repeat testing.  While 2019 novel coronavirus  (SARS-CoV-2) nucleic acids may be present in the submitted sample  additional confirmatory testing may be necessary for epidemiological  and / or clinical management purposes  to differentiate between  SARS-CoV-2 and other Sarbecovirus currently known to infect humans.  If clinically indicated additional testing with an alternate test  methodology [phone]) is advised. The SARS-CoV-2 RNA is  generally  detectable in upper and lower respiratory sp ecimens during the acute  phase of infection. The expected result is Negative. Fact Sheet for Patients:  StrictlyIdeas.no Fact Sheet for Healthcare Providers: BankingDealers.co.za This test is not yet approved or cleared by the [place] FDA and has been authorized for detection and/or diagnosis of SARS-CoV-2 by FDA under an Emergency Use Authorization (EUA).  This EUA will remain in effect (meaning this test can be used) for the duration of the COVID-19 declaration under Section 564(b)(1) of the Act, 21 U.S.C. section 360bbb-3(b)(1), unless the authorization is terminated or revoked sooner. Performed at [facility], [place] [address] [place],  [address]   Blood Culture (routine x 2)     Status: None (Preliminary result)   Collection  Time: 03/26/19 10:24 PM   Specimen: BLOOD  Result Value Ref Range Status   Specimen Description BLOOD LEFT ANTECUBITAL  Final   Special Requests   Final    BOTTLES DRAWN AEROBIC AND ANAEROBIC Blood Culture adequate volume   Culture   Final    NO GROWTH 2 DAYS Performed at [facility], [address], [place], [place] [address]    Report Status PENDING  Incomplete  Urine culture     Status: None (Preliminary result)   Collection Time: 03/26/19 10:34 PM   Specimen: In/Out Cath Urine  Result Value Ref Range Status   Specimen Description IN/OUT CATH URINE  Final   Special Requests NONE  Final   Culture   Final    CULTURE REINCUBATED FOR BETTER GROWTH Performed at [facility], [place] [address], [place], [place] [address]    Report Status PENDING  Incomplete      Radiology Studies: Ct Head Wo Contrast  Result Date: 03/26/2019 CLINICAL DATA:  Ataxia. Weakness. Prostate cancer with bone metastasis. EXAM: CT HEAD WITHOUT CONTRAST TECHNIQUE: Contiguous axial images were obtained from the base of the skull through the vertex  without intravenous contrast. COMPARISON:  02/20/2018 brain MR. FINDINGS: Brain: Cerebral and cerebellar atrophy, felt to be age appropriate. Moderate low density in the periventricular white matter likely related to small vessel disease. No mass lesion, hemorrhage, hydrocephalus, acute infarct, intra-axial, or extra-axial fluid collection. Vascular: Intracranial atherosclerosis. Skull: No significant soft tissue swelling. No skull fracture. There are sclerotic lesions throughout the calvarium consistent without osseous metastasis. Sinuses/Orbits: Enlargement of extraocular muscles, consistent with history of hyperthyroidism. Mucosal thickening of ethmoid air cells and the left maxillary sinus. Clear mastoid air cells. Other: None. IMPRESSION: 1. No acute intracranial abnormality. 2. Cerebral/cerebellar atrophy and small vessel ischemic change. 3. Sclerotic lesions throughout the calvarium, consistent with osseous metastasis. 4. Sinus disease. Electronically Signed   By: Abigail Miyamoto M.D.   On: 03/26/2019 20:28   Dg Chest Port 1 View  Result Date: 03/26/2019 CLINICAL DATA:  Fever EXAM: PORTABLE CHEST 1 VIEW COMPARISON:  10/15/2016 FINDINGS: Right-sided central venous port tip over the SVC. Normal heart size. No consolidation. Faint bilateral nodular foci of opacity, appears to project over skeletal structures. Most likely represents skeletal metastatic disease. No pneumothorax. IMPRESSION: 1. No acute airspace disease 2. Bilateral nodular foci over the chest, appear to correspond to sclerotic osseous lesions/skeletal metastatic disease. Electronically Signed   By: Donavan Foil M.D.   On: 03/26/2019 20:44    Scheduled Meds: . enoxaparin (LOVENOX) injection  40 mg Subcutaneous Q24H  . feeding supplement (ENSURE ENLIVE)  237 mL Oral BID BM  . levothyroxine  112 mcg Oral Q48H  . [START ON 03/29/2019] levothyroxine  125 mcg Oral Q48H  . megestrol  400 mg Oral Daily  . multivitamin with minerals  1 tablet Oral  Q breakfast  . sodium chloride flush  10-40 mL Intracatheter Q12H   Continuous Infusions: . ceFEPime (MAXIPIME) IV 2 g (03/28/19 1343)  . vancomycin 1,000 mg (03/28/19 0932)     LOS: 2 days   Time spent: 29 minutes   Darliss Cheney, MD [facility] Pager [phone]  If 7PM-7AM, please contact night-coverage [email or web address] Password [facility] 03/28/2019, 3:34 PM

## 2019-03-28 NOTE — Progress Notes (Signed)
Pt refuses CPAP for tonight.   

## 2019-03-29 DIAGNOSIS — D649 Anemia, unspecified: Secondary | ICD-10-CM | POA: Diagnosis present

## 2019-03-29 LAB — CBC WITH DIFFERENTIAL/PLATELET
Abs Immature Granulocytes: 0.07 10*3/uL (ref 0.00–0.07)
Basophils Absolute: 0 10*3/uL (ref 0.0–0.1)
Basophils Relative: 0 %
Eosinophils Absolute: 0 10*3/uL (ref 0.0–0.5)
Eosinophils Relative: 0 %
HCT: 23.5 % — ABNORMAL LOW (ref 39.0–52.0)
Hemoglobin: 8.1 g/dL — ABNORMAL LOW (ref 13.0–17.0)
Immature Granulocytes: 1 %
Lymphocytes Relative: 3 %
Lymphs Abs: 0.2 10*3/uL — ABNORMAL LOW (ref 0.7–4.0)
MCH: 31 pg (ref 26.0–34.0)
MCHC: 34.5 g/dL (ref 30.0–36.0)
MCV: 90 fL (ref 80.0–100.0)
Monocytes Absolute: 0.7 10*3/uL (ref 0.1–1.0)
Monocytes Relative: 9 %
Neutro Abs: 6.6 10*3/uL (ref 1.7–7.7)
Neutrophils Relative %: 87 %
Platelets: 128 10*3/uL — ABNORMAL LOW (ref 150–400)
RBC: 2.61 MIL/uL — ABNORMAL LOW (ref 4.22–5.81)
RDW: 17.4 % — ABNORMAL HIGH (ref 11.5–15.5)
WBC: 7.5 10*3/uL (ref 4.0–10.5)
nRBC: 0 % (ref 0.0–0.2)

## 2019-03-29 LAB — COMPREHENSIVE METABOLIC PANEL
ALT: 20 U/L (ref 0–44)
AST: 56 U/L — ABNORMAL HIGH (ref 15–41)
Albumin: 2.3 g/dL — ABNORMAL LOW (ref 3.5–5.0)
Alkaline Phosphatase: 69 U/L (ref 38–126)
Anion gap: 9 (ref 5–15)
BUN: 14 mg/dL (ref 8–23)
CO2: 21 mmol/L — ABNORMAL LOW (ref 22–32)
Calcium: 7.7 mg/dL — ABNORMAL LOW (ref 8.9–10.3)
Chloride: 95 mmol/L — ABNORMAL LOW (ref 98–111)
Creatinine, Ser: 0.68 mg/dL (ref 0.61–1.24)
GFR calc Af Amer: >60 mL/min (ref >60)
GFR calc non Af Amer: >60 mL/min (ref >60)
Glucose, Bld: 115 mg/dL — ABNORMAL HIGH (ref 70–99)
Potassium: 3.7 mmol/L (ref 3.5–5.1)
Sodium: 125 mmol/L — ABNORMAL LOW (ref 135–145)
Total Bilirubin: 0.9 mg/dL (ref 0.3–1.2)
Total Protein: 5.1 g/dL — ABNORMAL LOW (ref 6.5–8.1)

## 2019-03-29 LAB — SODIUM: Sodium: 124 mmol/L — ABNORMAL LOW (ref 135–145)

## 2019-03-29 MED ORDER — HEPARIN SOD (PORK) LOCK FLUSH 100 UNIT/ML IV SOLN
500.0000 [IU] | INTRAVENOUS | Status: AC | PRN
  Administered 2019-03-29: 16:00:00 500 [IU]

## 2019-03-29 MED ORDER — HEPARIN SOD (PORK) LOCK FLUSH 100 UNIT/ML IV SOLN: 500.0000 [IU] | INTRAVENOUS | Status: DC | PRN

## 2019-03-29 MED ORDER — SODIUM BICARBONATE 650 MG PO TABS: 650.0000 mg | ORAL_TABLET | Freq: Two times a day (BID) | ORAL | 0 refills | Status: AC

## 2019-03-29 MED ORDER — SODIUM BICARBONATE 650 MG PO TABS
650.0000 mg | ORAL_TABLET | Freq: Once | ORAL | Status: AC
  Administered 2019-03-29: 11:00:00 650 mg via ORAL
  Filled 2019-03-29: qty 1

## 2019-03-29 NOTE — Plan of Care (Signed)
Patient stable, discussed POC with patient and spouse, agreeable with plan, denies question/concerns at this time.  

## 2019-03-29 NOTE — Care Management (Signed)
RW and 3/1 to be delivered to patient room. Wife would like [facility] for [facility] services. Referral accepted. PTAR forms provided to bedside nurse with instructions to call once DME has been delivered to room for wife to take home, providing wife time to get home to let PTAR in with patient.

## 2019-03-29 NOTE — Discharge Summary (Signed)
Physician Discharge Summary  Edward Bailey [id number] DOB: Feb 05, 1936 DOA: 03/26/2019  PCP: [name], MD  Admit date: 03/26/2019 Discharge date: 03/29/2019  Admitted From: Home Disposition: Home  Recommendations for Outpatient Follow-up:  1. Follow up with PCP in 1-2 weeks 2. Please obtain BMP/CBC in one week 3. Please follow up on the following pending results:  Home Health: Yes Equipment/Devices: Rolling walker  Discharge Condition: Stable CODE STATUS: Full code Diet recommendation: Cardiac with fluid restriction up to 1500 mL for next 3 to 4 days.  Subjective: Seen and examined.  No complaints.  He claims that he feels much better.  Has not had any fever since admission.  Brief/Interim Summary: Edward Raiche Alligoodis a 83 y.o.malewith medical history significant of hypertension, GERD, hypothyroidism, metastasized prostate cancer (s/p of radiation, chemo and surgery), who presented with generalized weakness and fever.  Patient reports he stopped chemotherapy for his prostate cancer 7 weeks ago.Siince then he has been having progressive fatigue and weakness. He states that normally he is able to walk and get in and out of the car without difficulty, but in the last several days he feels difficult to do that. Patient denies subjective fever and chills, but his temperature was 100.8 in ED. Patient has poor appetite and decreased oral intake. He denies chest pain, shortness of breath, cough. No nausea vomiting, diarrhea, abdominal pain, symptoms of UTI, unilateral numbness. No facial droop or slurred speech. Patient reports a recent fall from sitting during which he hit his head but did not lose consciousness.  Upon arrival to the ED, pt was found to have WBC 7.5, negative COVID-19 test, sodium 125, TSH 4.756, free T4 1.46, renal function normal, lactic acid 1.9, INR 1.3, PTT 36, temperature 100.8, blood pressure 190/98, 116/57, tachycardia, tachypnea, oxygen saturation 96%  on room air. Chest x-ray negative. CT-head is negative for acute intracranial abnormalities, but showedosseous metastasis.    He was subsequently admitted to hospital service due to generalized weakness and Sirs with no source of infection as well as mild hyponatremia.  He was started on broad-spectrum antibiotics including cefepime and vancomycin and blood cultures were drawn.  Since admission, patient remained afebrile with no leukocytosis.  His hemoglobin dropped further to 6.8 and he received 1 unit PRBC transfusion per oncology recommendation.  Oncology had no further recommendations.  He was seen by PT OT and they recommended home health along with rolling walker which was arranged for him.  Initially, patient was thought to be having hypovolemic hyponatremia so he was given some IV fluids which improved his sodium but then it dropped back to 125.  In the meantime, we had all the labs come back including urine sodium, urine and serum osmolarity and based on that, he seems to have SIADH.  He does not seem to be on any medication which can cause this.  Etiology of SIADH remains unclear.  Although his sodium dropped from 128 08/24/2023 but patient in fact feels a stronger and does not have any symptoms of hyponatremia.  Patient is very eager and insistent to discharge him home.  I recommended staying in the hospital for at least 1 more day so we can follow his sodium but he remains firm in his decision to go home.  I had a long discussion with him and his wife and per his request, he is being discharged.  I strongly advised to stick to fluid restriction and sodium bicarb tablets and follow with PCP in next 4 to 5 days  so his sodium can be rechecked.  He verbalized understanding.  Since he did not have any fever and blood cultures remain negative so there seems to be no infection and thus no antibiotics are being prescribed.  Source of his low-grade fever also remains unknown.  Discharge Diagnoses:  Principal  Problem:   Sepsis ([place]) Active Problems:   Prostate cancer metastatic to bone (HCC)   Hyponatremia   Hypothyroidism   Fall   Generalized weakness   HTN (hypertension)   Symptomatic anemia    Discharge Instructions  Discharge Instructions    Discharge patient   Complete by: As directed    Discharge disposition: 01-Home or Self Care   Discharge patient date: 03/29/2019     Allergies as of 03/29/2019      Reactions   Amoxicillin Rash   Has patient had a PCN reaction causing immediate rash, facial/tongue/throat swelling, SOB or lightheadedness with hypotension: No Has patient had a PCN reaction causing severe rash involving mucus membranes or skin necrosis: No Has patient had a PCN reaction that required hospitalization No Has patient had a PCN reaction occurring within the last 10 years: Yes If all of the above answers are "NO", then may proceed with Cephalosporin use.      Medication List    TAKE these medications   amLODipine 5 MG tablet Commonly known as: NORVASC Take 5 mg by mouth daily.   azelastine 0.1 % nasal spray Commonly known as: ASTELIN Place 1-2 sprays into both nostrils daily as needed for rhinitis or allergies.   cetirizine 10 MG tablet Commonly known as: ZYRTEC Take 10 mg by mouth daily as needed for allergies or rhinitis.   fluticasone 50 MCG/ACT nasal spray Commonly known as: FLONASE Place 1 spray into both nostrils daily as needed for allergies or rhinitis.   levothyroxine 112 MCG tablet Commonly known as: SYNTHROID Take 112 mcg by mouth See admin instructions. Take 112 mcg by mouth every other night, rotating with the 125 mcg strength   levothyroxine 125 MCG tablet Commonly known as: SYNTHROID Take 125 mcg by mouth See admin instructions. Take 125 mcg by mouth every other night, rotating with the 112 mcg strength   lidocaine-prilocaine cream Commonly known as: EMLA Apply 1 application topically as needed. What changed: reasons to take this    megestrol 400 MG/10ML suspension Commonly known as: MEGACE Take 10 mLs (400 mg total) by mouth daily.   One-A-Day Mens 50+ Tabs Take 1 tablet by mouth daily with breakfast.   prochlorperazine 10 MG tablet Commonly known as: COMPAZINE Take 1 tablet (10 mg total) by mouth every 6 (six) hours as needed for nausea or vomiting.   sodium bicarbonate 650 MG tablet Take 1 tablet (650 mg total) by mouth 2 (two) times daily for 7 days.      Follow-up Information    [name], MD Follow up in 1 week(s).   Specialty: Internal Medicine Contact information: [address] [place] [address] [phone]          Allergies  Allergen Reactions  . Amoxicillin Rash    Has patient had a PCN reaction causing immediate rash, facial/tongue/throat swelling, SOB or lightheadedness with hypotension: No Has patient had a PCN reaction causing severe rash involving mucus membranes or skin necrosis: No Has patient had a PCN reaction that required hospitalization No Has patient had a PCN reaction occurring within the last 10 years: Yes If all of the above answers are "NO", then may  proceed with Cephalosporin use.     Consultations: None   Procedures/Studies: Ct Head Wo Contrast  Result Date: 03/26/2019 CLINICAL DATA:  Ataxia. Weakness. Prostate cancer with bone metastasis. EXAM: CT HEAD WITHOUT CONTRAST TECHNIQUE: Contiguous axial images were obtained from the base of the skull through the vertex without intravenous contrast. COMPARISON:  02/20/2018 brain MR. FINDINGS: Brain: Cerebral and cerebellar atrophy, felt to be age appropriate. Moderate low density in the periventricular white matter likely related to small vessel disease. No mass lesion, hemorrhage, hydrocephalus, acute infarct, intra-axial, or extra-axial fluid collection. Vascular: Intracranial atherosclerosis. Skull: No significant soft tissue swelling. No skull fracture. There are sclerotic lesions throughout the  calvarium consistent without osseous metastasis. Sinuses/Orbits: Enlargement of extraocular muscles, consistent with history of hyperthyroidism. Mucosal thickening of ethmoid air cells and the left maxillary sinus. Clear mastoid air cells. Other: None. IMPRESSION: 1. No acute intracranial abnormality. 2. Cerebral/cerebellar atrophy and small vessel ischemic change. 3. Sclerotic lesions throughout the calvarium, consistent with osseous metastasis. 4. Sinus disease. Electronically Signed   By: Abigail Miyamoto M.D.   On: 03/26/2019 20:28   Dg Chest Port 1 View  Result Date: 03/26/2019 CLINICAL DATA:  Fever EXAM: PORTABLE CHEST 1 VIEW COMPARISON:  10/15/2016 FINDINGS: Right-sided central venous port tip over the SVC. Normal heart size. No consolidation. Faint bilateral nodular foci of opacity, appears to project over skeletal structures. Most likely represents skeletal metastatic disease. No pneumothorax. IMPRESSION: 1. No acute airspace disease 2. Bilateral nodular foci over the chest, appear to correspond to sclerotic osseous lesions/skeletal metastatic disease. Electronically Signed   By: Donavan Foil M.D.   On: 03/26/2019 20:44      Discharge Exam: Vitals:   03/29/19 0805 03/29/19 1147  BP: 125/62 125/69  Pulse: 90 88  Resp: 16 16  Temp: 98.6 F (37 C) 98.2 F (36.8 C)  SpO2: 96% 99%   Vitals:   03/29/19 0005 03/29/19 0356 03/29/19 0805 03/29/19 1147  BP: 130/67 133/61 125/62 125/69  Pulse: 100 98 90 88  Resp: 18 18 16 16   Temp: 98 F (36.7 C) 98.3 F (36.8 C) 98.6 F (37 C) 98.2 F (36.8 C)  TempSrc: Oral Oral Oral Oral  SpO2: 100% 100% 96% 99%  Weight:      Height:        General: Pt is alert, awake, not in acute distress Cardiovascular: RRR, S1/S2 +, no rubs, no gallops Respiratory: CTA bilaterally, no wheezing, no rhonchi Abdominal: Soft, NT, ND, bowel sounds + Extremities: no edema, no cyanosis    The results of significant diagnostics from this hospitalization  (including imaging, microbiology, ancillary and laboratory) are listed below for reference.     Microbiology: Recent Results (from the past 240 hour(s))  Blood Culture (routine x 2)     Status: None (Preliminary result)   Collection Time: 03/26/19  7:47 PM   Specimen: BLOOD  Result Value Ref Range Status   Specimen Description BLOOD RIGHT ANTECUBITAL  Final   Special Requests   Final    BOTTLES DRAWN AEROBIC AND ANAEROBIC Blood Culture adequate volume   Culture   Final    NO GROWTH 3 DAYS Performed at [facility], [address], [place], [place] [address]    Report Status PENDING  Incomplete  SARS Coronavirus 2 [facility] order, Performed in [facility] hospital lab) Nasopharyngeal Urine, Clean Catch     Status: None   Collection Time: 03/26/19 10:00 PM   Specimen: Urine, Clean Catch; Nasopharyngeal  Result  Value Ref Range Status   SARS Coronavirus 2 NEGATIVE NEGATIVE Final    Comment: (NOTE) If result is NEGATIVE SARS-CoV-2 target nucleic acids are NOT DETECTED. The SARS-CoV-2 RNA is generally detectable in upper and lower  respiratory specimens during the acute phase of infection. The lowest  concentration of SARS-CoV-2 viral copies this assay can detect is 250  copies / mL. A negative result does not preclude SARS-CoV-2 infection  and should not be used as the sole basis for treatment or other  patient management decisions.  A negative result may occur with  improper specimen collection / handling, submission of specimen other  than nasopharyngeal swab, presence of viral mutation(s) within the  areas targeted by this assay, and inadequate number of viral copies  (<250 copies / mL). A negative result must be combined with clinical  observations, patient history, and epidemiological information. If result is POSITIVE SARS-CoV-2 target nucleic acids are DETECTED. The SARS-CoV-2 RNA is generally detectable in upper and lower  respiratory specimens dur ing the acute phase  of infection.  Positive  results are indicative of active infection with SARS-CoV-2.  Clinical  correlation with patient history and other diagnostic information is  necessary to determine patient infection status.  Positive results do  not rule out bacterial infection or co-infection with other viruses. If result is PRESUMPTIVE POSTIVE SARS-CoV-2 nucleic acids MAY BE PRESENT.   A presumptive positive result was obtained on the submitted specimen  and confirmed on repeat testing.  While 2019 novel coronavirus  (SARS-CoV-2) nucleic acids may be present in the submitted sample  additional confirmatory testing may be necessary for epidemiological  and / or clinical management purposes  to differentiate between  SARS-CoV-2 and other Sarbecovirus currently known to infect humans.  If clinically indicated additional testing with an alternate test  methodology [id number]) is advised. The SARS-CoV-2 RNA is generally  detectable in upper and lower respiratory sp ecimens during the acute  phase of infection. The expected result is Negative. Fact Sheet for Patients:  StrictlyIdeas.no Fact Sheet for Healthcare Providers: BankingDealers.co.za This test is not yet approved or cleared by the [place] FDA and has been authorized for detection and/or diagnosis of SARS-CoV-2 by FDA under an Emergency Use Authorization (EUA).  This EUA will remain in effect (meaning this test can be used) for the duration of the COVID-19 declaration under Section 564(b)(1) of the Act, 21 U.S.C. section 360bbb-3(b)(1), unless the authorization is terminated or revoked sooner. Performed at [facility], [place] [address] [place], [place] [address]   Blood Culture (routine x 2)     Status: None (Preliminary result)   Collection Time: 03/26/19 10:24 PM   Specimen: BLOOD  Result Value Ref Range Status   Specimen Description BLOOD LEFT ANTECUBITAL  Final   Special  Requests   Final    BOTTLES DRAWN AEROBIC AND ANAEROBIC Blood Culture adequate volume   Culture   Final    NO GROWTH 3 DAYS Performed at [facility], [place] [address], [place], [place] [address]    Report Status PENDING  Incomplete  Urine culture     Status: Abnormal (Preliminary result)   Collection Time: 03/26/19 10:34 PM   Specimen: In/Out Cath Urine  Result Value Ref Range Status   Specimen Description IN/OUT CATH URINE  Final   Special Requests NONE  Final   Culture (A)  Final    6,000 COLONIES/mL STAPHYLOCOCCUS HAEMOLYTICUS SUSCEPTIBILITIES TO FOLLOW Performed at [facility], [address] [place], [place] [address]    Report Status PENDING  Incomplete     Labs: BNP (last 3 results) No results for input(s): BNP in the last 8760 hours. Basic Metabolic Panel: Recent Labs  Lab 03/26/19 1944 03/27/19 0750 03/28/19 0436 03/28/19 1800 03/28/19 2258 03/29/19 0500  NA 125* 128* 124* 126* 127* 125*  K 4.5 4.1 3.6  --   --  3.7  CL 93* 97* 95*  --   --  95*  CO2 22 23 20*  --   --  21*  GLUCOSE 147* 121* 110*  --   --  115*  BUN 18 13 11   --   --  14  CREATININE 0.90 0.77 0.68  --   --  0.68  CALCIUM 8.2* 7.9* 7.6*  --   --  7.7*   Liver Function Tests: Recent Labs  Lab 03/26/19 1944 03/28/19 0436 03/29/19 0500  AST 53* 43* 56*  ALT 11 15 20   ALKPHOS 64 62 69  BILITOT 0.5 0.7 0.9  PROT 5.6* 5.0* 5.1*  ALBUMIN 2.8* 2.3* 2.3*   No results for input(s): LIPASE, AMYLASE in the last 168 hours. No results for input(s): AMMONIA in the last 168 hours. CBC: Recent Labs  Lab 03/26/19 1944 03/27/19 0750 03/27/19 1413 03/28/19 0436 03/29/19 0500  WBC 7.5 6.8 6.7 6.5 7.5  NEUTROABS 6.4  --  5.8 5.6 6.6  HGB 7.4* 7.0* 6.7* 8.5* 8.1*  HCT 22.4* 22.0* 20.0* 25.3* 23.5*  MCV 94.9 100.0 93.9 91.7 90.0  PLT 148* 134* 133* 121* 128*   Cardiac Enzymes: No results for input(s): CKTOTAL, CKMB, CKMBINDEX, TROPONINI in the last 168 hours. BNP: Invalid  input(s): POCBNP CBG: No results for input(s): GLUCAP in the last 168 hours. D-Dimer No results for input(s): DDIMER in the last 72 hours. Hgb A1c No results for input(s): HGBA1C in the last 72 hours. Lipid Profile No results for input(s): CHOL, HDL, LDLCALC, TRIG, CHOLHDL, LDLDIRECT in the last 72 hours. Thyroid function studies Recent Labs    03/26/19 1945  TSH 4.756*   Anemia work up No results for input(s): VITAMINB12, FOLATE, FERRITIN, TIBC, IRON, RETICCTPCT in the last 72 hours. Urinalysis    Component Value Date/Time   COLORURINE YELLOW 03/26/2019 2216   APPEARANCEUR CLEAR 03/26/2019 2216   LABSPEC 1.016 03/26/2019 2216   PHURINE 6.0 03/26/2019 2216   GLUCOSEU NEGATIVE 03/26/2019 2216   HGBUR NEGATIVE 03/26/2019 2216   [place] 03/26/2019 2216   [place] 03/26/2019 2216   PROTEINUR 30 (A) 03/26/2019 2216   NITRITE NEGATIVE 03/26/2019 2216   LEUKOCYTESUR NEGATIVE 03/26/2019 2216   Sepsis Labs Invalid input(s): PROCALCITONIN,  WBC,  LACTICIDVEN Microbiology Recent Results (from the past 240 hour(s))  Blood Culture (routine x 2)     Status: None (Preliminary result)   Collection Time: 03/26/19  7:47 PM   Specimen: BLOOD  Result Value Ref Range Status   Specimen Description BLOOD RIGHT ANTECUBITAL  Final   Special Requests   Final    BOTTLES DRAWN AEROBIC AND ANAEROBIC Blood Culture adequate volume   Culture   Final    NO GROWTH 3 DAYS Performed at [facility], [place] [address], [place], [place] [address]    Report Status PENDING  Incomplete  SARS Coronavirus 2 [facility] order, Performed in [facility] hospital lab) Nasopharyngeal Urine, Clean Catch     Status: None   Collection Time: 03/26/19 10:00 PM   Specimen: Urine, Clean Catch; Nasopharyngeal  Result Value Ref Range Status   SARS Coronavirus 2 NEGATIVE NEGATIVE Final    Comment: (NOTE) If result is NEGATIVE SARS-CoV-2 target nucleic acids are NOT DETECTED. The SARS-CoV-2 RNA  is generally detectable in upper and lower  respiratory specimens during the acute phase of infection. The lowest  concentration of SARS-CoV-2 viral copies this assay can detect is 250  copies / mL. A negative result does not preclude SARS-CoV-2 infection  and should not be used as the sole basis for treatment or other  patient management decisions.  A negative result may occur with  improper specimen collection / handling, submission of specimen other  than nasopharyngeal swab, presence of viral mutation(s) within the  areas targeted by this assay, and inadequate number of viral copies  (<250 copies / mL). A negative result must be combined with clinical  observations, patient history, and epidemiological information. If result is POSITIVE SARS-CoV-2 target nucleic acids are DETECTED. The SARS-CoV-2 RNA is generally detectable in upper and lower  respiratory specimens dur ing the acute phase of infection.  Positive  results are indicative of active infection with SARS-CoV-2.  Clinical  correlation with patient history and other diagnostic information is  necessary to determine patient infection status.  Positive results do  not rule out bacterial infection or co-infection with other viruses. If result is PRESUMPTIVE POSTIVE SARS-CoV-2 nucleic acids MAY BE PRESENT.   A presumptive positive result was obtained on the submitted specimen  and confirmed on repeat testing.  While 2019 novel coronavirus  (SARS-CoV-2) nucleic acids may be present in the submitted sample  additional confirmatory testing may be necessary for epidemiological  and / or clinical management purposes  to differentiate between  SARS-CoV-2 and other Sarbecovirus currently known to infect humans.  If clinically indicated additional testing with an alternate test  methodology [phone]) is advised. The SARS-CoV-2 RNA is generally  detectable in upper and lower respiratory sp ecimens during the acute  phase of  infection. The expected result is Negative. Fact Sheet for Patients:  StrictlyIdeas.no Fact Sheet for Healthcare Providers: BankingDealers.co.za This test is not yet approved or cleared by the [place] FDA and has been authorized for detection and/or diagnosis of SARS-CoV-2 by FDA under an Emergency Use Authorization (EUA).  This EUA will remain in effect (meaning this test can be used) for the duration of the COVID-19 declaration under Section 564(b)(1) of the Act, 21 U.S.C. section 360bbb-3(b)(1), unless the authorization is terminated or revoked sooner. Performed at [facility], [place] [address], [place], [place] [address]   Blood Culture (routine x 2)     Status: None (Preliminary result)   Collection Time: 03/26/19 10:24 PM   Specimen: BLOOD  Result Value Ref Range Status   Specimen Description BLOOD LEFT ANTECUBITAL  Final   Special Requests   Final    BOTTLES DRAWN AEROBIC AND ANAEROBIC Blood Culture adequate volume   Culture   Final    NO GROWTH 3 DAYS Performed at [facility], [place] [address], [place], [place] [address]    Report Status PENDING  Incomplete  Urine culture     Status: Abnormal (Preliminary result)   Collection Time: 03/26/19 10:34 PM   Specimen: In/Out Cath Urine  Result Value Ref Range Status   Specimen Description IN/OUT CATH URINE  Final   Special Requests NONE  Final   Culture (A)  Final    6,000 COLONIES/mL STAPHYLOCOCCUS HAEMOLYTICUS SUSCEPTIBILITIES TO FOLLOW Performed at [facility],  [address], [place], [place] [address]    Report Status PENDING  Incomplete     Time coordinating discharge: Over 30 minutes  SIGNED:   Darliss Cheney, MD  [facility] 03/29/2019, 1:13 PM Pager [phone]  If 7PM-7AM, please contact night-coverage [email or web address] Password [email or web address]

## 2019-03-29 NOTE — Discharge Instructions (Signed)
Syndrome of Inappropriate Antidiuretic Hormone Syndrome of inappropriate antidiuretic hormone (SIADH) is a condition in which extra antidiuretic hormone (ADH) is produced in the body. ADH controls water levels in the body. When too much of the hormone is produced, it can cause problems such as low salt (sodium) levels in your blood and reduced urine production. When this happens, the body retains too much water, which can cause various symptoms. Symptoms can range from mild to severe. SIADH is most common in people who are hospitalized for another reason. What are the causes? This condition may be caused by:  Tumors.  Injury to the brain.  Infection or inflammation in the brain, such as meningitis or encephalitis.  Lung infections or diseases.  Cancer.  Major surgery.  Extreme exercise.  Stroke.  Guillain-Barr syndrome (GBS).  Medicines, including chemotherapy drugs, NSAIDs, and pain medicines.  Problems with breathing (respiratory failure).  AIDS. What increases the risk? The following factors may make you more likely to develop this condition:  Increasing age.  Recent major surgery.  Hospitalization. What are the signs or symptoms? Symptoms of this condition include:  Irritability.  Muscle cramps.  Nausea or vomiting.  Mental changes, such as memory problems, aggressiveness, confusion, or hallucinations.  Seizure.  Coma. How is this diagnosed? This condition may be diagnosed based on:  Your medical history.  A physical exam.  Blood tests.  Urine tests.  Chest X-ray or brain scan. How is this treated? Treatment for this condition focuses on relieving symptoms. Treatment may include:  Restricting fluid intake.  Receiving sterile salt water (saline) slowly through an IV.  Medicines to help remove water from the body (diuretics).  Medicines to help control sodium levels. Initial treatment for SIADH is done in a hospital, so your health care team  can monitor your condition closely. Other treatments may focus on treating the underlying cause of the condition. Follow these instructions at home:  Follow your health care provider's instructions on restricting your intake of fluids.  Take over-the-counter and prescription medicines only as told by your health care provider.  Keep all follow-up visits as told by your health care provider. This is important. Contact a health care provider if you:  Have muscle cramps.  Are nauseous or you vomit. Get help right away if you have:  A seizure.  Loss of consciousness.  Mental changes, such as memory problems, aggressiveness, confusion, or hallucinations. Summary  Syndrome of inappropriate antidiuretic hormone (SIADH) is a condition in which extra antidiuretic hormone (ADH) is produced in the body.  SIADH is most common in people who are hospitalized for another reason.  Initial treatment for SIADH is done in the hospital setting in order to monitor your condition closely.  Follow your health care provider's instructions on restricting your intake of fluids. This information is not intended to replace advice given to you by your health care provider. Make sure you discuss any questions you have with your health care provider. Document Released: 02/03/2014 Document Revised: 08/01/2017 Document Reviewed: 08/01/2017 Elsevier Patient Education  2020 [employer].

## 2019-03-30 LAB — URINE CULTURE: Culture: 6000 — AB

## 2019-03-31 DIAGNOSIS — R531 Weakness: Secondary | ICD-10-CM | POA: Diagnosis not present

## 2019-03-31 DIAGNOSIS — H01113 Allergic dermatitis of right eye, unspecified eyelid: Secondary | ICD-10-CM | POA: Diagnosis not present

## 2019-03-31 DIAGNOSIS — E871 Hypo-osmolality and hyponatremia: Secondary | ICD-10-CM | POA: Diagnosis not present

## 2019-03-31 LAB — CULTURE, BLOOD (ROUTINE X 2)
Culture: NO GROWTH
Culture: NO GROWTH
Special Requests: ADEQUATE
Special Requests: ADEQUATE

## 2019-04-01 ENCOUNTER — Other Ambulatory Visit: Payer: Medicare Other

## 2019-04-01 ENCOUNTER — Encounter: Payer: Self-pay | Admitting: Internal Medicine

## 2019-04-01 ENCOUNTER — Other Ambulatory Visit: Payer: Medicare Other | Admitting: Internal Medicine

## 2019-04-01 DIAGNOSIS — Z515 Encounter for palliative care: Secondary | ICD-10-CM

## 2019-04-01 NOTE — Progress Notes (Signed)
Sept 9st, 2020 [facility] Consult Note Telephone: [phone]  Fax: [phone]   Due to the current COVID-19 infection/crises, the family prefer, and have given their verbal consent for, a provider visit via telemedicine. HIPPA policies of confidentially were discussed. Video-audio (telehealth) contact was unable to be done due technical barriers from the patient's side.   PATIENT NAME: Edward Bailey DOB: 1935-10-12 MRN: [id number] [address] [address] Pt home #:  [phone] Cell:  [phone]   PRIMARY CARE PROVIDER:   [name], MD [address] [place], [place] [address]   REFERRING PROVIDER:   Dr. Franchot Gallo ([facility] [phone]) Dr. Zola Button (Oncology) RESPONSIBLE PARTY:   Joycelyn Schmid (wife) (H) [phone], [phone]) [phone]. (son) Eldor Queer ([phone] [email or web address]   ASSESSMENT / RECOMMENDATIONS:  1. Advance Care Planning: A. Advance Care Directives: DNR. MOST form/educational materials had been mailed to family. B. Goals of Care: To increase strength through home therapies. Patient understands his disease is incurable an would likely cause him further debilitation in the immediate future. Would qualify for hospice if he opted for no further treatment. Has f/u Sept 10th Dr. Alen Blew (telehealth) for repeat evaluation. Continuing active surveillance for the time being.  Plan to proceed with guardant 360 tumor analysis.  PARP inhibitor would be reasonable if he harbors an appropriate mutation.    2.Cognitive / Functional status: Increased weakness; but better post tx PRBS during recent hospitalization. Alert and oriented. Increased somnolence so awake only about 4-6 hours/day. He is a little less cloudy headed than before recent hospitalization. He is able to self-transfer and shuffles with walker short distances. Oral intake improved; Ensure for breakfast and up  to 4/day. Consumes 50% of lunch/dinner. No recent weights. Last weight 202 lbs. at a height of 6'2" his BMI is 26kg/m2. Spouse reports that patient stopped his salt tabs ordered at the time of hospital discharge, after checking with his PCP. Patient to start post hospitalization SN/HA/PT/OT.  3. Family Supports: Lives with supportive spouse Joycelyn Schmid "[name]"; second marriage of 4  years. They both have very supportive children from previous marriages.    4. Follow up Palliative Care Visit: Mon Sept 14th at 1pm home (per family request) visit.  Plan to f/u on mailed MOST form, BP, fatigue/weakness.  I spent 30 minutes providing this consultation, from 4:30-5pm. More than 50% of the time in this consultation was spent coordinating communication.    HISTORY OF PRESENT ILLNESS:  Edward Bailey is a 83 y.o. male with prostate cancer (dx 1999; resection ,chemo; continues monthly Xgeva q mo and Depot Lurpon q 4 mo depending on testosterone level) with bony metastasis (dx 2017; spine and pelvis), anemia (Hbg 8.1), GERD, HTN, hyper/hypothyroid, and allergic rhinitis. He is s/p hospitalization 9/6-03/29/2019, where he was treated for hyponatremia (SIADH with Na 125) and anemia (Hgb 6.8 tx 1 unit PRBCs). This is a f/u Palliative Care visit from Sept 1st.    CODE STATUS: DNR   PPS: 50%   HOSPICE ELIGIBILITY/DIAGNOSIS: yes, pending deferral of further chemotherapy  PAST MEDICAL HISTORY:  Past Medical History:  Diagnosis Date  . GERD (gastroesophageal reflux disease)   . Hyperthyroidism   . Prostate cancer [facility])    prostate cancer radiation and surgery 15 years ago, Dr. Maceo Pro    SOCIAL HX:  Social History   Tobacco Use  . Smoking status: Never Smoker  . Smokeless tobacco: Never Used  Substance Use Topics  .  Alcohol use: Yes    Comment: occasional    ALLERGIES:  Allergies  Allergen Reactions  . Amoxicillin Rash    Has patient had a PCN reaction causing immediate rash, facial/tongue/throat  swelling, SOB or lightheadedness with hypotension: No Has patient had a PCN reaction causing severe rash involving mucus membranes or skin necrosis: No Has patient had a PCN reaction that required hospitalization No Has patient had a PCN reaction occurring within the last 10 years: Yes If all of the above answers are "NO", then may proceed with Cephalosporin use.      PERTINENT MEDICATIONS:  Outpatient Encounter Medications as of 04/01/2019  Medication Sig  . amLODipine (NORVASC) 5 MG tablet Take 5 mg by mouth daily.   Marland Kitchen azelastine (ASTELIN) 0.1 % nasal spray Place 1-2 sprays into both nostrils daily as needed for rhinitis or allergies.   . cetirizine (ZYRTEC) 10 MG tablet Take 10 mg by mouth daily as needed for allergies or rhinitis.   . fluticasone (FLONASE) 50 MCG/ACT nasal spray Place 1 spray into both nostrils daily as needed for allergies or rhinitis.   Marland Kitchen levothyroxine (SYNTHROID, LEVOTHROID) 112 MCG tablet Take 112 mcg by mouth See admin instructions. Take 112 mcg by mouth every other night, rotating with the 125 mcg strength  . levothyroxine (SYNTHROID, LEVOTHROID) 125 MCG tablet Take 125 mcg by mouth See admin instructions. Take 125 mcg by mouth every other night, rotating with the 112 mcg strength  . lidocaine-prilocaine (EMLA) cream Apply 1 application topically as needed. (Patient taking differently: Apply 1 application topically as needed (to numb). )  . Multiple Vitamins-Minerals (ONE-A-DAY MENS 50+) TABS Take 1 tablet by mouth daily with breakfast.  . prochlorperazine (COMPAZINE) 10 MG tablet Take 1 tablet (10 mg total) by mouth every 6 (six) hours as needed for nausea or vomiting.  . megestrol (MEGACE) 400 MG/10ML suspension Take 10 mLs (400 mg total) by mouth daily.  . sodium bicarbonate 650 MG tablet Take 1 tablet (650 mg total) by mouth 2 (two) times daily for 7 days. (Patient not taking: Reported on 04/01/2019)   No facility-administered encounter medications on file as of  04/01/2019.     PHYSICAL EXAM:   PE exam deferred d/t telephonic/audio only nature of telehealth visit. Patient is alert and oriented. Speech soft. No dyspnea with conversation.  Julianne Handler, NP

## 2019-04-02 ENCOUNTER — Inpatient Hospital Stay: Payer: Medicare Other | Attending: Oncology | Admitting: Oncology

## 2019-04-02 ENCOUNTER — Other Ambulatory Visit: Payer: Self-pay

## 2019-04-02 DIAGNOSIS — G893 Neoplasm related pain (acute) (chronic): Secondary | ICD-10-CM | POA: Diagnosis not present

## 2019-04-02 DIAGNOSIS — C61 Malignant neoplasm of prostate: Secondary | ICD-10-CM | POA: Diagnosis not present

## 2019-04-02 DIAGNOSIS — C7951 Secondary malignant neoplasm of bone: Secondary | ICD-10-CM | POA: Diagnosis not present

## 2019-04-02 DIAGNOSIS — K5900 Constipation, unspecified: Secondary | ICD-10-CM

## 2019-04-02 DIAGNOSIS — D649 Anemia, unspecified: Secondary | ICD-10-CM

## 2019-04-02 MED ORDER — MAGNESIUM CITRATE PO SOLN: 1.0000 | Freq: Once | ORAL | 0 refills | Status: AC

## 2019-04-02 NOTE — Progress Notes (Signed)
Hematology and Oncology Follow Up for Telemedicine Visits  Edward Bailey [id number] [birth year] 83 y.o. 04/02/2019 12:18 PM [name], MDGriffin, Edward Reichmann, MD   I connected with Edward Bailey on 04/02/19 at 12:00 PM EDT by telephone visit and verified that I am speaking with the correct person using two identifiers.  Attempts to enable face-to-face video visit has failed due to technology failure.   I discussed the limitations, risks, security and privacy concerns of performing an evaluation and management service by telemedicine and the availability of in-person appointments. I also discussed with the patient that there may be a patient responsible charge related to this service. The patient expressed understanding and agreed to proceed.  Other persons participating in the visit and their role in the encounter: [name]  Patient's location: Home Provider's location: Office    Principle Diagnosis: 83 year old with castration-resistant prostate cancer with disease to the bone diagnosed in 2017.  He was initially diagnosed with localized disease in 1999.   Prior Therapy: He is status post prostatectomy and found to have stage T3b disease. He received salvage radiation therapy for a rise in his PSA.  He developed biochemical relapse and was treated with androgen depravation intermittently between 2010 to about 2015.  He developed castration resistant disease in 2015.  Ketoconazole at 200 mg twice a day and prednisone 5 mg daily. Therapy discontinued on 05/24/2016 because of progression of disease. Zytiga 1000 mg daily with prednisone at 5 mg daily started in November 2017. Xtandi 160 mg daily started in April 2019 therapy discontinued due to progression of disease and July 2019. [name] started in July 2019.  He completed 6 months of therapy in December 2019. Xtandi 160 mg resumed in August 2019 after brief discontinuation in July 2019.  Therapy discontinued permanently in January  2020 for progression of disease. Taxotere chemotherapy started on August 21, 2018 and receiving 75 mg per metered square.  He completed 9 cycles of therapy on February 06, 2019.  Current therapy: Supportive care only.  Interim History: Edward Bailey reports continuous decline in his overall health.  He was hospitalized last week between September 3 and September 6 for weakness and failure to thrive.  Received a intravenous hydration and supportive management and was discharged home.  Since his discharge, he has been limited in his mobility and continues to have overall fatigue and tiredness.  He does report to have bone pain but has not worsened at this time and does not require any pain medication.  He does have issues with constipation but no abdominal pain.    Medications: Reviewed today without changes. Current Outpatient Medications  Medication Sig Dispense Refill  . amLODipine (NORVASC) 5 MG tablet Take 5 mg by mouth daily.   3  . azelastine (ASTELIN) 0.1 % nasal spray Place 1-2 sprays into both nostrils daily as needed for rhinitis or allergies.   5  . cetirizine (ZYRTEC) 10 MG tablet Take 10 mg by mouth daily as needed for allergies or rhinitis.     . fluticasone (FLONASE) 50 MCG/ACT nasal spray Place 1 spray into both nostrils daily as needed for allergies or rhinitis.   5  . levothyroxine (SYNTHROID, LEVOTHROID) 112 MCG tablet Take 112 mcg by mouth See admin instructions. Take 112 mcg by mouth every other night, rotating with the 125 mcg strength  12  . levothyroxine (SYNTHROID, LEVOTHROID) 125 MCG tablet Take 125 mcg by mouth See admin instructions. Take 125 mcg by mouth every other night, rotating with  the 112 mcg strength  9  . lidocaine-prilocaine (EMLA) cream Apply 1 application topically as needed. (Patient taking differently: Apply 1 application topically as needed (to numb). ) 30 g 0  . megestrol (MEGACE) 400 MG/10ML suspension Take 10 mLs (400 mg total) by mouth daily. 240 mL 0  .  Multiple Vitamins-Minerals (ONE-A-DAY MENS 50+) TABS Take 1 tablet by mouth daily with breakfast.    . prochlorperazine (COMPAZINE) 10 MG tablet Take 1 tablet (10 mg total) by mouth every 6 (six) hours as needed for nausea or vomiting. 30 tablet 0  . sodium bicarbonate 650 MG tablet Take 1 tablet (650 mg total) by mouth 2 (two) times daily for 7 days. (Patient not taking: Reported on 04/01/2019) 14 tablet 0   No current facility-administered medications for this visit.      Allergies:  Allergies  Allergen Reactions  . Amoxicillin Rash    Has patient had a PCN reaction causing immediate rash, facial/tongue/throat swelling, SOB or lightheadedness with hypotension: No Has patient had a PCN reaction causing severe rash involving mucus membranes or skin necrosis: No Has patient had a PCN reaction that required hospitalization No Has patient had a PCN reaction occurring within the last 10 years: Yes If all of the above answers are "NO", then may proceed with Cephalosporin use.     Past Medical History, Surgical history, Social history, and Family History without any changes on review.    Lab Results: Lab Results  Component Value Date   WBC 7.5 03/29/2019   HGB 8.1 (L) 03/29/2019   HCT 23.5 (L) 03/29/2019   MCV 90.0 03/29/2019   PLT 128 (L) 03/29/2019     Chemistry      Component Value Date/Time   NA 124 (L) 03/29/2019 1205   NA 139 06/07/2017 1057   K 3.7 03/29/2019 0500   K 3.6 06/07/2017 1057   CL 95 (L) 03/29/2019 0500   CO2 21 (L) 03/29/2019 0500   CO2 26 06/07/2017 1057   BUN 14 03/29/2019 0500   BUN 18.5 06/07/2017 1057   CREATININE 0.68 03/29/2019 0500   CREATININE 1.00 03/16/2019 1032   CREATININE 1.2 06/07/2017 1057      Component Value Date/Time   CALCIUM 7.7 (L) 03/29/2019 0500   CALCIUM 9.0 06/07/2017 1057   ALKPHOS 69 03/29/2019 0500   ALKPHOS 112 06/07/2017 1057   AST 56 (H) 03/29/2019 0500   AST 38 03/16/2019 1032   AST 14 06/07/2017 1057   ALT 20  03/29/2019 0500   ALT 8 03/16/2019 1032   ALT 12 06/07/2017 1057   BILITOT 0.9 03/29/2019 0500   BILITOT 0.4 03/16/2019 1032   BILITOT 0.66 06/07/2017 1057     Results for Edward Bailey, Edward Bailey (MRN [id number]) as of 04/02/2019 12:02  Ref. Range 02/24/2019 10:28 03/16/2019 10:32  Prostate Specific Ag, Serum Latest Ref Range: 0.0 - 4.0 ng/mL 664.0 (H) 1,069.0 (H)    Impression and Plan:  83 year old man with  1.    Castration-resistant prostate cancer with disease to the bone diagnosed in 2017.    He has progressed on multiple therapies outlined above and reaching end-stage status.  Guardant 360 analysis showed no actionable mutation at this time.  Based on these findings and his overall decline I have recommended against any additional cancer treatment.  I recommended proceeding with supportive care only and addressing symptoms as they arise.  He is agreeable with this plan and palliative care services has been notified and ready to enroll  in the near future.  I feel he would benefit from hospice in the immediate future.     2.  Anemia: Hemoglobin is around 8 on discharge and I do not think he benefits from additional transfusion.  3.  IV access: Port-A-Cath will remain in place for the time being.  4.  Constipation: We have discussed strategies to improve his constipation including dairy MiraLAX as well as using laxative in the immediate future.  Prescription for magnesium citrate will be available to him.   5.  Prognosis and goals of care: Prognosis is poor with limited life expectancy at this time.  He would qualify for hospice with life expectancy less than 6 months.  6. Followup:  As needed at this time will rely on palliative care and hospice for updates.    I discussed the assessment and treatment plan with the patient. The patient was provided an opportunity to ask questions and all were answered. The patient agreed with the plan and demonstrated an understanding of  the instructions.   The patient was advised to call back or seek an in-person evaluation if the symptoms worsen or if the condition fails to improve as anticipated.  I provided 25 minutes of face-to-face video visit time that was dedicated to reviewing his disease status, treatment options, long-term prognosis and immediate plan of care.  Zola Button, MD 04/02/2019 12:18 PM

## 2019-04-03 DIAGNOSIS — C61 Malignant neoplasm of prostate: Secondary | ICD-10-CM | POA: Diagnosis not present

## 2019-04-03 DIAGNOSIS — R6521 Severe sepsis with septic shock: Secondary | ICD-10-CM | POA: Diagnosis not present

## 2019-04-03 LAB — GUARDANT 360

## 2019-04-06 ENCOUNTER — Other Ambulatory Visit: Payer: Medicare Other | Admitting: Internal Medicine

## 2019-04-06 ENCOUNTER — Telehealth: Payer: Self-pay | Admitting: *Deleted

## 2019-04-06 ENCOUNTER — Other Ambulatory Visit: Payer: Self-pay | Admitting: Oncology

## 2019-04-06 ENCOUNTER — Other Ambulatory Visit: Payer: Self-pay

## 2019-04-06 DIAGNOSIS — Z515 Encounter for palliative care: Secondary | ICD-10-CM

## 2019-04-06 MED ORDER — HYDROCODONE-ACETAMINOPHEN 5-325 MG PO TABS: 1.0000 | ORAL_TABLET | Freq: Four times a day (QID) | ORAL | 0 refills | Status: AC | PRN

## 2019-04-07 ENCOUNTER — Encounter: Payer: Self-pay | Admitting: Internal Medicine

## 2019-04-07 NOTE — Progress Notes (Signed)
Sept 14th, 2020 [facility] Palliative Care Consult Note Telephone: [phone]  Fax: [phone]   PATIENT NAME: Edward Bailey DOB: Mar 08, 1936 MRN: [id number] [address] [place] [id number] Pt home #:  [phone] Cell:  [phone]   PRIMARY CARE PROVIDER:   [name], MD [facility] [address] [facility] [address], [place] [address]   REFERRING PROVIDER:   Dr. Franchot Bailey ([facility] [phone]) Dr. Zola Bailey (Oncology) RESPONSIBLE PARTY:   Edward Bailey (wife) (H) [phone], [phone]. (son) Edward Bailey [facility] [email or web address]   ASSESSMENT / RECOMMENDATIONS:  1. Advance Care Planning: A. Advance Care Directives: Patient has DNR. MOST form/educational materials had been mailed to family. I'll f/u next home visit to perhaps complete these forms B. Goals of Care: Patient had recent office visit with Dr. Alen Bailey; patient relates that there are no other therapy options available to him. Patient's son had suggested the patient request a second opinion at [facility] in care there were experimental therapies that he might benefit from, but patient really doesn't wish to put forth that sort of effort. He doubts that he could tolerate the travel let alone any suggested options, in his current state of health. He is focusing on increasing his strength, and hopefully weight, and enjoying his life. We discussed scope of hospice care and services. Patient knows he is eligible and could tap into these services but wishes to first utilize physical therapy services through [place] (pending evaluation) before considering hospice.   2. Symptom management: A. Constipation: LBM few weeks ago. Has been taking Mag Citrate; took  bottle this am; plans to complete. If no results, recommend 2 senokot tonight, additional 2 tabs tomorrow am. Call me if no results (he has my contact information.  B. Chest pain: across the upper  anterior chest when he crosses his arms from one side to the other. Hydrocodone 5-325mg  was called in by PCP. Patient believe pain from metastatic bone disease. This is the first sign of discomfort from his disease. He has the understanding that taking a Claritin tablet can help with metastatic pain, so he took this as well.  C. Deconditioning/fatigue for chronic illness/hospitalization/decreased appetite/nutrition: Consumes about 50% of his meals with Ensure supplements. Anticipates Physical Therapy consult and is hoping to get an aide through those services as well.   3. Functional Status: A & O. Able to transfer and ambulate (use of walker) independently. Continent of bowel and bladder. Needs assist for hygiene and dressing.    4. Family Supports: Lives with supportive spouse Edward Bailey "Edward Bailey"; second marriage of 4  years. They both have very supportive children from previous marriages.    5. Follow up Palliative Care Visit: Thurs Oct 8 noon,home (per family request) visit.  Plan to f/u on mailed MOST form, BP, fatigue/weakness.   I spent 60 minutes providing this consultation, from 1pm-2pm. More than 50% of the time in this consultation was spent coordinating communication.    HISTORY OF PRESENT ILLNESS:  Edward Bailey is a 83 y.o. male with prostate cancer (dx 1999; resection ,chemo; continues monthly Xgeva q mo and Depot Lurpon q 4 mo depending on testosterone level) with bony metastasis (dx 2017; spine and pelvis), anemia (Hbg 8.1), GERD, HTN, hyper/hypothyroid, and allergic rhinitis. He is s/p hospitalization 9/3-03/29/2019, where he was treated for hyponatremia (SIADH with Na 125) and anemia (Hgb 6.8 tx 1 unit PRBCs). This is a f/u Palliative Care visit from Sept 9th.    CODE  STATUS: DNR   PPS: 40%   HOSPICE ELIGIBILITY/DIAGNOSIS: yes, pending deferral of further chemotherapy  PAST MEDICAL HISTORY:  Past Medical History:  Diagnosis Date  . GERD (gastroesophageal reflux disease)    . Hyperthyroidism   . Prostate cancer Edward Bailey)    prostate cancer radiation and surgery 15 years ago, Dr. Maceo Bailey    SOCIAL HX:  Social History   Tobacco Use  . Smoking status: Never Smoker  . Smokeless tobacco: Never Used  Substance Use Topics  . Alcohol use: Yes    Comment: occasional    ALLERGIES:  Allergies  Allergen Reactions  . Amoxicillin Rash    Has patient had a PCN reaction causing immediate rash, facial/tongue/throat swelling, SOB or lightheadedness with hypotension: No Has patient had a PCN reaction causing severe rash involving mucus membranes or skin necrosis: No Has patient had a PCN reaction that required hospitalization No Has patient had a PCN reaction occurring within the last 10 years: Yes If all of the above answers are "NO", then may proceed with Cephalosporin use.      PERTINENT MEDICATIONS:  Outpatient Encounter Medications as of 04/06/2019  Medication Sig  . amLODipine (NORVASC) 5 MG tablet Take 5 mg by mouth daily.   Marland Kitchen azelastine (ASTELIN) 0.1 % nasal spray Place 1-2 sprays into both nostrils daily as needed for rhinitis or allergies.   . cetirizine (ZYRTEC) 10 MG tablet Take 10 mg by mouth daily as needed for allergies or rhinitis.   . fluticasone (FLONASE) 50 MCG/ACT nasal spray Place 1 spray into both nostrils daily as needed for allergies or rhinitis.   Marland Kitchen HYDROcodone-acetaminophen (NORCO) 5-325 MG tablet Take 1 tablet by mouth every 6 (six) hours as needed for moderate pain.  Marland Kitchen levothyroxine (SYNTHROID, LEVOTHROID) 112 MCG tablet Take 112 mcg by mouth See admin instructions. Take 112 mcg by mouth every other night, rotating with the 125 mcg strength  . levothyroxine (SYNTHROID, LEVOTHROID) 125 MCG tablet Take 125 mcg by mouth See admin instructions. Take 125 mcg by mouth every other night, rotating with the 112 mcg strength  . lidocaine-prilocaine (EMLA) cream Apply 1 application topically as needed. (Patient taking differently: Apply 1 application  topically as needed (to numb). )  . megestrol (MEGACE) 400 MG/10ML suspension Take 10 mLs (400 mg total) by mouth daily.  . Multiple Vitamins-Minerals (ONE-A-DAY MENS 50+) TABS Take 1 tablet by mouth daily with breakfast.  . prochlorperazine (COMPAZINE) 10 MG tablet Take 1 tablet (10 mg total) by mouth every 6 (six) hours as needed for nausea or vomiting.   No facility-administered encounter medications on file as of 04/06/2019.     PHYSICAL EXAM:   General: NAD, frail appearing, thin Cardiovascular: regular rate and rhythm Pulmonary: clear ant fields Abdomen: soft, nontender, + bowel sounds Extremities: no edema, no joint deformities Skin: no rashes Neurological: Weakness but otherwise nonfocal  Julianne Handler, NP

## 2019-04-08 ENCOUNTER — Encounter: Payer: Self-pay | Admitting: Oncology

## 2019-04-23 ENCOUNTER — Encounter: Payer: Self-pay | Admitting: Oncology

## 2019-04-27 ENCOUNTER — Telehealth: Payer: Self-pay | Admitting: *Deleted

## 2019-04-27 ENCOUNTER — Other Ambulatory Visit: Payer: Medicare Other | Admitting: Internal Medicine

## 2019-04-27 NOTE — Telephone Encounter (Signed)
Received call from Violeta Gelinas, NP with Palliative Care. Family has requested  to re-schedule to Thursday, 04/30/19 @ 12 noon.

## 2019-04-29 DIAGNOSIS — L821 Other seborrheic keratosis: Secondary | ICD-10-CM | POA: Diagnosis not present

## 2019-04-29 DIAGNOSIS — L820 Inflamed seborrheic keratosis: Secondary | ICD-10-CM | POA: Diagnosis not present

## 2019-04-30 ENCOUNTER — Other Ambulatory Visit: Payer: Medicare Other | Admitting: Internal Medicine

## 2019-04-30 ENCOUNTER — Other Ambulatory Visit: Payer: Self-pay

## 2019-04-30 DIAGNOSIS — Z515 Encounter for palliative care: Secondary | ICD-10-CM

## 2019-05-02 ENCOUNTER — Encounter: Payer: Self-pay | Admitting: Internal Medicine

## 2019-05-02 NOTE — Progress Notes (Signed)
Oct 8th, 2020 [facility] Palliative Care Consult Note Telephone: [phone]  Fax: [phone]  PATIENT NAME: Edward Bailey DOB: 02-12-1936 MRN: [id number] [address] [place] [id number] Pt home #:  [phone] Cell:  [phone]   PRIMARY CARE PROVIDER:   [name], MD [facility] [address] [place], [place] [address]   REFERRING PROVIDER:   Dr. Franchot Gallo ([facility] [phone]) Dr. Zola Button (Oncology) RESPONSIBLE PARTY:   Joycelyn Schmid (wife) (H) [phone], [phone]) [phone]. (son) Donshay Morita [facility] [email or web address]   ASSESSMENT / RECOMMENDATIONS:  1. Advance Care Planning: A. Directives: Patient has DNR. We completed the MOST form today  B. Goals of Care: Patient relates no other cancer therapy options available to him.  Goal is to continue exercising, eating well, and enjoy life.    2. Symptom management: Patient is in good spirits. His strength has improved with PT/OT. Now using the walker more so than the cane and is transferring and walking independently. He needs only light assist with hygiene and dressing. Now strong enough to leave the home for short trips. Recently got his flu shot and made it to the dermatologist for a buttocks skin tag removal and he is sitting more comfortably.  His constipation has resolved, and he no longer needs laxatives. He no longer has positional anterior chest pain. He is maintaining his weight; currenlty 195lbs. At 6'2" his BMI is 25kg/m2. Good appetite with 100% consumption of 3 meals/day with nutritional supplements.    3. Family Supports: Lives with supportive spouse Joycelyn Schmid "[name]"; second marriage of 4  years. They both have very supportive children from previous marriages.    4. Follow up Palliative Care Visit: Demetrius Charity 06/25/2019 @noon    I spent 60 minutes providing this consultation, from 1pm-2pm. More than 50% of the time in this consultation was  spent coordinating communication.    HISTORY OF PRESENT ILLNESS:  SCHAUN DECARLI is a 83 y.o. male with prostate cancer (dx 1999; resection ,chemo; continues monthly Xgeva q mo and Depot Lurpon q 4 mo depending on testosterone level) with bony metastasis (dx 2017; spine and pelvis), anemia (Hbg 8.1), GERD, HTN, hyper/hypothyroid, and allergic rhinitis. He is s/p hospitalization 9/3-03/29/2019, where he was treated for hyponatremia (SIADH with Na 125) and anemia (Hgb 6.8 tx 1 unit PRBCs). This is a f/u Palliative Care visit from Sept 14th.    CODE STATUS: DNR/MOST: DNR/DNI. Yes to full scope of medical care, but would wish to limit mechanical ventilation to 1 week or less. Yes to antibiotics, yes to IVFs, no to Tube feedings.   PPS: 40%   HOSPICE ELIGIBILITY/DIAGNOSIS: yes, pending need for services  PAST MEDICAL HISTORY:  Past Medical History:  Diagnosis Date  . GERD (gastroesophageal reflux disease)   . Hyperthyroidism   . Prostate cancer [facility])    prostate cancer radiation and surgery 15 years ago, Dr. Maceo Pro    SOCIAL HX:  Social History   Tobacco Use  . Smoking status: Never Smoker  . Smokeless tobacco: Never Used  Substance Use Topics  . Alcohol use: Yes    Comment: occasional    ALLERGIES:  Allergies  Allergen Reactions  . Amoxicillin Rash    Has patient had a PCN reaction causing immediate rash, facial/tongue/throat swelling, SOB or lightheadedness with hypotension: No Has patient had a PCN reaction causing severe rash involving mucus membranes or skin necrosis: No Has patient had a PCN reaction that required hospitalization No Has patient had  a PCN reaction occurring within the last 10 years: Yes If all of the above answers are "NO", then may proceed with Cephalosporin use.      PERTINENT MEDICATIONS:  Outpatient Encounter Medications as of 04/30/2019  Medication Sig  . amLODipine (NORVASC) 5 MG tablet Take 5 mg by mouth daily.   Marland Kitchen azelastine (ASTELIN) 0.1 % nasal  spray Place 1-2 sprays into both nostrils daily as needed for rhinitis or allergies.   . cetirizine (ZYRTEC) 10 MG tablet Take 10 mg by mouth daily as needed for allergies or rhinitis.   . fluticasone (FLONASE) 50 MCG/ACT nasal spray Place 1 spray into both nostrils daily as needed for allergies or rhinitis.   Marland Kitchen HYDROcodone-acetaminophen (NORCO) 5-325 MG tablet Take 1 tablet by mouth every 6 (six) hours as needed for moderate pain.  Marland Kitchen levothyroxine (SYNTHROID, LEVOTHROID) 112 MCG tablet Take 112 mcg by mouth See admin instructions. Take 112 mcg by mouth every other night, rotating with the 125 mcg strength  . levothyroxine (SYNTHROID, LEVOTHROID) 125 MCG tablet Take 125 mcg by mouth See admin instructions. Take 125 mcg by mouth every other night, rotating with the 112 mcg strength  . lidocaine-prilocaine (EMLA) cream Apply 1 application topically as needed. (Patient taking differently: Apply 1 application topically as needed (to numb). )  . megestrol (MEGACE) 400 MG/10ML suspension Take 10 mLs (400 mg total) by mouth daily.  . Multiple Vitamins-Minerals (ONE-A-DAY MENS 50+) TABS Take 1 tablet by mouth daily with breakfast.  . prochlorperazine (COMPAZINE) 10 MG tablet Take 1 tablet (10 mg total) by mouth every 6 (six) hours as needed for nausea or vomiting.   No facility-administered encounter medications on file as of 04/30/2019.     PHYSICAL EXAM:   General: NAD, slender. Alert and oriented. Pleasantly conversant. Spouse [name] in attendance. Cardiovascular: regular rate and rhythm Pulmonary: clear ant fields Abdomen: soft, nontender, + bowel sounds Extremities: no edema, no joint deformities Skin: no rashes Neurological: Weakness but otherwise non-focal    Julianne Handler, NP

## 2019-05-04 ENCOUNTER — Telehealth: Payer: Self-pay | Admitting: Oncology

## 2019-05-04 NOTE — Telephone Encounter (Signed)
Scheduled appt per 10/12 sch message - pt aware of appt date and time

## 2019-05-09 ENCOUNTER — Inpatient Hospital Stay: Payer: Medicare Other | Attending: Oncology

## 2019-05-09 ENCOUNTER — Other Ambulatory Visit: Payer: Self-pay

## 2019-05-09 VITALS — BP 139/60 | Temp 98.2°F | Resp 20

## 2019-05-09 DIAGNOSIS — Z95828 Presence of other vascular implants and grafts: Secondary | ICD-10-CM

## 2019-05-09 DIAGNOSIS — C7951 Secondary malignant neoplasm of bone: Secondary | ICD-10-CM

## 2019-05-09 DIAGNOSIS — C61 Malignant neoplasm of prostate: Secondary | ICD-10-CM | POA: Diagnosis not present

## 2019-05-09 DIAGNOSIS — Z452 Encounter for adjustment and management of vascular access device: Secondary | ICD-10-CM | POA: Diagnosis not present

## 2019-05-09 MED ORDER — HEPARIN SOD (PORK) LOCK FLUSH 100 UNIT/ML IV SOLN
500.0000 [IU] | Freq: Once | INTRAVENOUS | Status: AC
  Administered 2019-05-09: 500 [IU]
  Filled 2019-05-09: qty 5

## 2019-05-09 MED ORDER — SODIUM CHLORIDE 0.9% FLUSH
10.0000 mL | Freq: Once | INTRAVENOUS | Status: DC
  Filled 2019-05-09: qty 10

## 2019-05-18 DIAGNOSIS — G4733 Obstructive sleep apnea (adult) (pediatric): Secondary | ICD-10-CM | POA: Diagnosis not present

## 2019-05-19 ENCOUNTER — Telehealth: Payer: Self-pay | Admitting: Oncology

## 2019-05-19 NOTE — Telephone Encounter (Signed)
Returned patient's phone call regarding scheduling an appointment, per patient's request port has been scheduled.

## 2019-05-20 ENCOUNTER — Other Ambulatory Visit: Payer: Self-pay

## 2019-05-20 ENCOUNTER — Encounter: Payer: Self-pay | Admitting: Podiatry

## 2019-05-20 ENCOUNTER — Ambulatory Visit (INDEPENDENT_AMBULATORY_CARE_PROVIDER_SITE_OTHER): Payer: Medicare Other | Admitting: Podiatry

## 2019-05-20 DIAGNOSIS — G629 Polyneuropathy, unspecified: Secondary | ICD-10-CM

## 2019-05-20 DIAGNOSIS — C7951 Secondary malignant neoplasm of bone: Secondary | ICD-10-CM | POA: Diagnosis not present

## 2019-05-20 DIAGNOSIS — M79675 Pain in left toe(s): Secondary | ICD-10-CM | POA: Diagnosis not present

## 2019-05-20 DIAGNOSIS — G4733 Obstructive sleep apnea (adult) (pediatric): Secondary | ICD-10-CM | POA: Diagnosis not present

## 2019-05-20 DIAGNOSIS — B351 Tinea unguium: Secondary | ICD-10-CM

## 2019-05-20 DIAGNOSIS — G4731 Primary central sleep apnea: Secondary | ICD-10-CM | POA: Diagnosis not present

## 2019-05-20 DIAGNOSIS — M79674 Pain in right toe(s): Secondary | ICD-10-CM

## 2019-05-20 DIAGNOSIS — I10 Essential (primary) hypertension: Secondary | ICD-10-CM | POA: Diagnosis not present

## 2019-05-20 NOTE — Progress Notes (Signed)
Complaint:  Visit Type: Patient presents  to my office for  preventative foot care services. Complaint: Patient states" my nails have grown long and thick and become painful to walk and wear shoes" Patient is under going chemo... The patient presents for preventative foot care services.   Podiatric Exam: Vascular: dorsalis pedis and posterior tibial pulses are palpable bilateral. Capillary return is immediate. Temperature gradient is WNL. Skin turgor WNL  Sensorium: Diminished Semmes Weinstein monofilament test. Normal tactile sensation bilaterally. Nail Exam: Pt has thick disfigured discolored nails with subungual debris noted bilateral entire nail hallux through fifth toenails Ulcer Exam: There is no evidence of ulcer or pre-ulcerative changes or infection. Orthopedic Exam: Muscle tone and strength are WNL. No limitations in general ROM. No crepitus or effusions noted. Foot type and digits show no abnormalities. Bony prominences are unremarkable. Skin: No Porokeratosis. No infection or ulcers  Diagnosis:  Onychomycosis, , Pain in right toe, pain in left toes  Treatment & Plan Procedures and Treatment: Consent by patient was obtained for treatment procedures.   Debridement of mycotic and hypertrophic toenails, 1 through 5 bilateral and clearing of subungual debris. No ulceration, no infection noted. Patient received additional  diabetic socks. 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 Return Visit-Office Procedure: Patient instructed to return to the office for a follow up visit 3 months for continued evaluation and treatment.    Gardiner Barefoot DPM

## 2019-05-22 DIAGNOSIS — C61 Malignant neoplasm of prostate: Secondary | ICD-10-CM | POA: Diagnosis not present

## 2019-05-22 DIAGNOSIS — C7951 Secondary malignant neoplasm of bone: Secondary | ICD-10-CM | POA: Diagnosis not present

## 2019-05-22 DIAGNOSIS — I10 Essential (primary) hypertension: Secondary | ICD-10-CM | POA: Diagnosis not present

## 2019-05-22 DIAGNOSIS — E039 Hypothyroidism, unspecified: Secondary | ICD-10-CM | POA: Diagnosis not present

## 2019-05-27 ENCOUNTER — Telehealth: Payer: Self-pay

## 2019-05-27 NOTE — Telephone Encounter (Signed)
Received message from patient PCP Dr. Laurann Montana that the patient Hgb is 6.9 and is requesting a transfusion. Made Dr. Alen Blew aware and received a verbal order for 2 units PRBC's. Scheduling message sent and spoke with high priority scheduler for requested appointments.

## 2019-05-28 ENCOUNTER — Other Ambulatory Visit: Payer: Self-pay

## 2019-05-28 ENCOUNTER — Telehealth: Payer: Self-pay

## 2019-05-28 DIAGNOSIS — D649 Anemia, unspecified: Secondary | ICD-10-CM

## 2019-05-28 DIAGNOSIS — H0011 Chalazion right upper eyelid: Secondary | ICD-10-CM | POA: Diagnosis not present

## 2019-05-28 NOTE — Telephone Encounter (Signed)
Patient scheduled for 05/29/19 (lab and infusion), and 05/30/19 (infusion) for blood transfusion. Called and spoke with patient and his wife. Both verbalized understanding for appointment dates and times.

## 2019-05-29 ENCOUNTER — Telehealth: Payer: Self-pay

## 2019-05-29 ENCOUNTER — Inpatient Hospital Stay: Payer: Medicare Other | Attending: Oncology

## 2019-05-29 ENCOUNTER — Other Ambulatory Visit: Payer: Self-pay

## 2019-05-29 ENCOUNTER — Inpatient Hospital Stay: Payer: Medicare Other

## 2019-05-29 DIAGNOSIS — C61 Malignant neoplasm of prostate: Secondary | ICD-10-CM | POA: Insufficient documentation

## 2019-05-29 DIAGNOSIS — C7951 Secondary malignant neoplasm of bone: Secondary | ICD-10-CM | POA: Insufficient documentation

## 2019-05-29 DIAGNOSIS — G4737 Central sleep apnea in conditions classified elsewhere: Secondary | ICD-10-CM | POA: Diagnosis not present

## 2019-05-29 DIAGNOSIS — Z95828 Presence of other vascular implants and grafts: Secondary | ICD-10-CM

## 2019-05-29 DIAGNOSIS — D649 Anemia, unspecified: Secondary | ICD-10-CM

## 2019-05-29 DIAGNOSIS — G4733 Obstructive sleep apnea (adult) (pediatric): Secondary | ICD-10-CM | POA: Diagnosis not present

## 2019-05-29 LAB — CMP (CANCER CENTER ONLY)
ALT: 10 U/L (ref 0–44)
AST: 23 U/L (ref 15–41)
Albumin: 2.5 g/dL — ABNORMAL LOW (ref 3.5–5.0)
Alkaline Phosphatase: 129 U/L — ABNORMAL HIGH (ref 38–126)
Anion gap: 12 (ref 5–15)
BUN: 12 mg/dL (ref 8–23)
CO2: 21 mmol/L — ABNORMAL LOW (ref 22–32)
Calcium: 7.5 mg/dL — ABNORMAL LOW (ref 8.9–10.3)
Chloride: 99 mmol/L (ref 98–111)
Creatinine: 0.7 mg/dL (ref 0.61–1.24)
GFR, Est AFR Am: >60 mL/min (ref >60)
GFR, Estimated: >60 mL/min (ref >60)
Glucose, Bld: 98 mg/dL (ref 70–99)
Potassium: 3.9 mmol/L (ref 3.5–5.1)
Sodium: 132 mmol/L — ABNORMAL LOW (ref 135–145)
Total Bilirubin: 0.3 mg/dL (ref 0.3–1.2)
Total Protein: 6.2 g/dL — ABNORMAL LOW (ref 6.5–8.1)

## 2019-05-29 LAB — CBC WITH DIFFERENTIAL (CANCER CENTER ONLY)
Abs Immature Granulocytes: 0.11 10*3/uL — ABNORMAL HIGH (ref 0.00–0.07)
Basophils Absolute: 0 10*3/uL (ref 0.0–0.1)
Basophils Relative: 0 %
Eosinophils Absolute: 0 10*3/uL (ref 0.0–0.5)
Eosinophils Relative: 1 %
HCT: 19.9 % — ABNORMAL LOW (ref 39.0–52.0)
Hemoglobin: 6.3 g/dL — CL (ref 13.0–17.0)
Immature Granulocytes: 2 %
Lymphocytes Relative: 6 %
Lymphs Abs: 0.3 10*3/uL — ABNORMAL LOW (ref 0.7–4.0)
MCH: 28.5 pg (ref 26.0–34.0)
MCHC: 31.7 g/dL (ref 30.0–36.0)
MCV: 90 fL (ref 80.0–100.0)
Monocytes Absolute: 0.5 10*3/uL (ref 0.1–1.0)
Monocytes Relative: 8 %
Neutro Abs: 4.9 10*3/uL (ref 1.7–7.7)
Neutrophils Relative %: 83 %
Platelet Count: 255 10*3/uL (ref 150–400)
RBC: 2.21 MIL/uL — ABNORMAL LOW (ref 4.22–5.81)
RDW: 19.9 % — ABNORMAL HIGH (ref 11.5–15.5)
WBC Count: 5.8 10*3/uL (ref 4.0–10.5)
nRBC: 0 % (ref 0.0–0.2)

## 2019-05-29 LAB — PREPARE RBC (CROSSMATCH)

## 2019-05-29 LAB — ABO/RH: ABO/RH(D): O POS

## 2019-05-29 MED ORDER — DIPHENHYDRAMINE HCL 25 MG PO CAPS: 25.0000 mg | ORAL_CAPSULE | Freq: Once | ORAL | Status: DC

## 2019-05-29 MED ORDER — ACETAMINOPHEN 325 MG PO TABS
650.0000 mg | ORAL_TABLET | Freq: Once | ORAL | Status: AC
  Administered 2019-05-29: 650 mg via ORAL

## 2019-05-29 MED ORDER — ACETAMINOPHEN 325 MG PO TABS: 650.0000 mg | ORAL_TABLET | Freq: Once | ORAL | Status: DC

## 2019-05-29 MED ORDER — DIPHENHYDRAMINE HCL 25 MG PO CAPS
ORAL_CAPSULE | ORAL | Status: AC
  Filled 2019-05-29: qty 1

## 2019-05-29 MED ORDER — ACETAMINOPHEN 325 MG PO TABS
ORAL_TABLET | ORAL | Status: AC
  Filled 2019-05-29: qty 2

## 2019-05-29 MED ORDER — SODIUM CHLORIDE 0.9% FLUSH
10.0000 mL | INTRAVENOUS | Status: AC | PRN
  Administered 2019-05-29: 10 mL
  Filled 2019-05-29: qty 10

## 2019-05-29 MED ORDER — SODIUM CHLORIDE 0.9% IV SOLUTION
250.0000 mL | Freq: Once | INTRAVENOUS | Status: AC
  Administered 2019-05-29: 250 mL via INTRAVENOUS
  Filled 2019-05-29: qty 250

## 2019-05-29 MED ORDER — DIPHENHYDRAMINE HCL 25 MG PO CAPS
25.0000 mg | ORAL_CAPSULE | Freq: Once | ORAL | Status: AC
  Administered 2019-05-29: 25 mg via ORAL

## 2019-05-29 MED ORDER — SODIUM CHLORIDE 0.9% FLUSH
10.0000 mL | Freq: Once | INTRAVENOUS | Status: AC
  Administered 2019-05-29: 10 mL
  Filled 2019-05-29: qty 10

## 2019-05-29 MED ORDER — HEPARIN SOD (PORK) LOCK FLUSH 100 UNIT/ML IV SOLN
500.0000 [IU] | Freq: Every day | INTRAVENOUS | Status: AC | PRN
  Administered 2019-05-29: 500 [IU]
  Filled 2019-05-29: qty 5

## 2019-05-29 NOTE — Patient Instructions (Signed)

## 2019-05-29 NOTE — Patient Instructions (Signed)
Blood Transfusion, Adult, Care After This sheet gives you information about how to care for yourself after your procedure. Your doctor may also give you more specific instructions. If you have problems or questions, contact your doctor. Follow these instructions at home:   Take over-the-counter and prescription medicines only as told by your doctor.  Go back to your normal activities as told by your doctor.  Follow instructions from your doctor about how to take care of the area where an IV tube was put into your vein (insertion site). Make sure you: ? Wash your hands with soap and water before you change your bandage (dressing). If there is no soap and water, use hand sanitizer. ? Change your bandage as told by your doctor.  Check your IV insertion site every day for signs of infection. Check for: ? More redness, swelling, or pain. ? More fluid or blood. ? Warmth. ? Pus or a bad smell. Contact a doctor if:  You have more redness, swelling, or pain around the IV insertion site.  You have more fluid or blood coming from the IV insertion site.  Your IV insertion site feels warm to the touch.  You have pus or a bad smell coming from the IV insertion site.  Your pee (urine) turns pink, red, or brown.  You feel weak after doing your normal activities. Get help right away if:  You have signs of a serious allergic or body defense (immune) system reaction, including: ? Itchiness. ? Hives. ? Trouble breathing. ? Anxiety. ? Pain in your chest or lower back. ? Fever, flushing, and chills. ? Fast pulse. ? Rash. ? Watery poop (diarrhea). ? Throwing up (vomiting). ? Dark pee. ? Serious headache. ? Dizziness. ? Stiff neck. ? Yellow color in your face or the white parts of your eyes (jaundice). Summary  After a blood transfusion, return to your normal activities as told by your doctor.  Every day, check for signs of infection where the IV tube was put into your vein.  Some  signs of infection are warm skin, more redness and pain, more fluid or blood, and pus or a bad smell where the needle went in.  Contact your doctor if you feel weak or have any unusual symptoms. This information is not intended to replace advice given to you by your health care provider. Make sure you discuss any questions you have with your health care provider. Document Released: 07/30/2014 Document Revised: 11/13/2017 Document Reviewed: 03/02/2016 Elsevier Patient Education  2020 Else[employer] Inc.  

## 2019-05-29 NOTE — Telephone Encounter (Signed)
Lab called with Hgb 6.3. Dr. Alen Blew made aware. Patient scheduled to received 2 units of PRBCs today.

## 2019-05-30 ENCOUNTER — Inpatient Hospital Stay: Payer: Medicare Other

## 2019-05-30 LAB — BPAM RBC
Blood Product Expiration Date: 202012082359
Blood Product Expiration Date: 202012082359
ISSUE DATE / TIME: 202011061356
ISSUE DATE / TIME: 202011061542
Unit Type and Rh: 5100
Unit Type and Rh: 5100

## 2019-05-30 LAB — TYPE AND SCREEN
ABO/RH(D): O POS
Antibody Screen: NEGATIVE
Unit division: 0
Unit division: 0

## 2019-05-30 LAB — PROSTATE-SPECIFIC AG, SERUM (LABCORP): Prostate Specific Ag, Serum: 1125 ng/mL — ABNORMAL HIGH (ref 0.0–4.0)

## 2019-06-03 ENCOUNTER — Encounter: Payer: Self-pay | Admitting: Oncology

## 2019-06-04 ENCOUNTER — Telehealth: Payer: Self-pay

## 2019-06-04 ENCOUNTER — Other Ambulatory Visit: Payer: Self-pay

## 2019-06-04 DIAGNOSIS — C61 Malignant neoplasm of prostate: Secondary | ICD-10-CM | POA: Diagnosis not present

## 2019-06-04 DIAGNOSIS — G629 Polyneuropathy, unspecified: Secondary | ICD-10-CM | POA: Diagnosis not present

## 2019-06-04 DIAGNOSIS — C7951 Secondary malignant neoplasm of bone: Secondary | ICD-10-CM | POA: Diagnosis not present

## 2019-06-04 DIAGNOSIS — R531 Weakness: Secondary | ICD-10-CM | POA: Diagnosis not present

## 2019-06-04 MED ORDER — GABAPENTIN 300 MG PO CAPS: 300.0000 mg | ORAL_CAPSULE | Freq: Every day | ORAL | 3 refills | Status: AC

## 2019-06-04 NOTE — Telephone Encounter (Signed)
-----   Message from Wyatt Portela, MD sent at 06/04/2019 11:44 AM EST ----- Is he on Neurontin?  If not we can call him 300 mg at to take nightly #30 with 3 refills. ----- Message ----- From: Tami Lin, RN Sent: 06/04/2019  11:05 AM EST To: Wyatt Portela, MD  Mr. Dority sent this message via my chart.   I have neuropathy following Chemo.   It is painful.   Can you suggest a resource where I might have this treated?   I called him and he said he has numbness, tingling, tenderness and some pain in both feet. Denies any problems with his hands. Reports numbness and tingling in his chin as well.  Lanelle Bal

## 2019-06-06 ENCOUNTER — Encounter: Payer: Self-pay | Admitting: Oncology

## 2019-06-12 ENCOUNTER — Telehealth: Payer: Self-pay | Admitting: Oncology

## 2019-06-12 ENCOUNTER — Other Ambulatory Visit: Payer: Medicare Other | Admitting: Internal Medicine

## 2019-06-12 ENCOUNTER — Telehealth: Payer: Self-pay

## 2019-06-12 DIAGNOSIS — R634 Abnormal weight loss: Secondary | ICD-10-CM

## 2019-06-12 DIAGNOSIS — Z515 Encounter for palliative care: Secondary | ICD-10-CM | POA: Diagnosis not present

## 2019-06-12 NOTE — Telephone Encounter (Signed)
Scheduled appt per 11/20 sch message - pt is aware of appt date and time   

## 2019-06-12 NOTE — Telephone Encounter (Signed)
Contacted patient and spoke with spouse regarding patient flush appt scheduled for 11/21. Explained that patient port was flushed on 11/6 and only needs to be flushed Q 4-6 weeks. She then requested that the port flush appointments be scheduled for Saturdays only. She requested the next appt be scheduled on 12/5 at 11 am. Appt cancelled of 11/21 and scheduling message sent for 12/5 appt and 2 additional port flush appointments. Spouse then stated that she has questions about the patient current health status and expectations. Advised that she contact the Palliative care NP as she can come to the house to assess and provide the answers to her questions. She verbalized understanding and had no other questions or concerns.

## 2019-06-13 ENCOUNTER — Inpatient Hospital Stay: Payer: Medicare Other

## 2019-06-14 ENCOUNTER — Encounter: Payer: Self-pay | Admitting: Internal Medicine

## 2019-06-14 NOTE — Progress Notes (Signed)
Nov 20th, 2020 [facility] Palliative Care Consult Note Telephone: [phone]  Fax: [phone]   Due to the current COVID-19 infection/crises, the family prefer, and have given their verbal consent for, a provider visit via telemedicine. HIPPA policies of confidentially were discussed. Video-audio (telehealth) contact was unable to be done due technical barriers from the patient's side. PATIENT NAME: Edward Bailey DOB: 1936-03-19 MRN: [id number] [address] [place] [id number] Pt home #:  [phone] Cell:  [phone]   PRIMARY CARE PROVIDER:   [name], MD [address] [place] [address] [place],  [address] [facility] [address] [address]   REFERRING PROVIDER:   Dr. Franchot Gallo ([facility] [phone]) Dr. Zola Button ([place]  RESPONSIBLE PARTY:   Joycelyn Schmid (wife) (H) [phone], [phone]) [phone]. (son) Shigeru Lamberth [facility] Edwards [email or web address]  stopped chemo. still very weak. appetite tapered off. Eats but not a lot. Weight 187. Still with PT   ASSESSMENT / RECOMMENDATIONS:  1. Advance Care Planning: A. Directives: Has DNR and MOST.   B. Goals of Care: No further chemotherapy planned. Goal: not to be a burden to his wife.   2. Cognitive/functional status: Patient is emotional today; coming to terms with approaching EOL Tearful at times then apologetic about displaying his emotions. Mentions he's fearful of being a burden to his very loving and supportive wife [name]. They speak openly about his feelings and concerns, and [name] reminds patient that if the tables were turned and it were her needing the help, she is confident patient would be there for her. Patient is still working with PT to maintain strength. Patient has developed symptoms of neuropathy in his feet, improved with gabapentin. Denies nausea or pain. Decreased appetite; weight 187 (down 8 lbs over 6 weeks). At a height of 6'2"  his BMI is 24 kg/m2.  -Remeron 15mg  qhs prescribed for appetite stimulation and as an antidepressant.   -I consulted our [place] Duffy to provide supportive counsel setting of life threatening illness.    3. Family Supports: Lives with supportive spouse Joycelyn Schmid "[name]"; second marriage of 4  years. They both have very supportive children from previous marriages.    4. Follow up Palliative Care Visit: Demetrius Charity 06/25/2019 @noon .   I spent 60 minutes providing this consultation, from 1pm-2pm. More than 50% of the time in this consultation was spent coordinating communication.    HISTORY OF PRESENT ILLNESS:  Edward Bailey is a 83 y.o. male with prostate cancer (dx 1999; resection ,chemo; continues monthly Xgeva q mo and Depot Lurpon q 4 mo depending on testosterone level) with bony metastasis (dx 2017; spine and pelvis), anemia (Hbg 8.1), GERD, HTN, hyper/hypothyroid, and allergic rhinitis. He is s/p hospitalization 9/3-03/29/2019, where he was treated for hyponatremia (SIADH with Na 125) and anemia (Hgb 6.8 tx 1 unit PRBCs). This is a f/u Palliative Care visit from Oct 8th.    CODE STATUS: DNR/MOST: DNR/DNI. Yes to full scope of medical care, but would wish to limit mechanical ventilation to 1 week or less. Yes to antibiotics, yes to IVFs, no to Tube feedings.   PPS: 40%   HOSPICE ELIGIBILITY/DIAGNOSIS: yes, pending need for services PAST MEDICAL HISTORY:  Past Medical History:  Diagnosis Date  . GERD (gastroesophageal reflux disease)   . Hyperthyroidism   . Prostate cancer [facility])    prostate cancer radiation and surgery 15 years ago, Dr. Maceo Pro    SOCIAL HX:  Social History  Tobacco Use  . Smoking status: Never Smoker  . Smokeless tobacco: Never Used  Substance Use Topics  . Alcohol use: Yes    Comment: occasional    ALLERGIES:  Allergies  Allergen Reactions  . Amoxicillin Rash    Has patient had a PCN reaction causing immediate rash,  facial/tongue/throat swelling, SOB or lightheadedness with hypotension: No Has patient had a PCN reaction causing severe rash involving mucus membranes or skin necrosis: No Has patient had a PCN reaction that required hospitalization No Has patient had a PCN reaction occurring within the last 10 years: Yes If all of the above answers are "NO", then may proceed with Cephalosporin use.      PERTINENT MEDICATIONS:  Outpatient Encounter Medications as of 06/12/2019  Medication Sig  . mirtazapine (REMERON) 15 MG tablet Take 15 mg by mouth at bedtime.  Marland Kitchen amLODipine (NORVASC) 5 MG tablet Take 5 mg by mouth daily.   Marland Kitchen azelastine (ASTELIN) 0.1 % nasal spray Place 1-2 sprays into both nostrils daily as needed for rhinitis or allergies.   . cetirizine (ZYRTEC) 10 MG tablet Take 10 mg by mouth daily as needed for allergies or rhinitis.   . fluticasone (FLONASE) 50 MCG/ACT nasal spray Place 1 spray into both nostrils daily as needed for allergies or rhinitis.   Marland Kitchen gabapentin (NEURONTIN) 300 MG capsule Take 1 capsule (300 mg total) by mouth at bedtime.  Marland Kitchen HYDROcodone-acetaminophen (NORCO) 5-325 MG tablet Take 1 tablet by mouth every 6 (six) hours as needed for moderate pain.  Marland Kitchen levothyroxine (SYNTHROID, LEVOTHROID) 112 MCG tablet Take 112 mcg by mouth See admin instructions. Take 112 mcg by mouth every other night, rotating with the 125 mcg strength  . levothyroxine (SYNTHROID, LEVOTHROID) 125 MCG tablet Take 125 mcg by mouth See admin instructions. Take 125 mcg by mouth every other night, rotating with the 112 mcg strength  . lidocaine-prilocaine (EMLA) cream Apply 1 application topically as needed. (Patient taking differently: Apply 1 application topically as needed (to numb). )  . megestrol (MEGACE) 400 MG/10ML suspension Take 10 mLs (400 mg total) by mouth daily.  . Multiple Vitamins-Minerals (ONE-A-DAY MENS 50+) TABS Take 1 tablet by mouth daily with breakfast.  . prochlorperazine (COMPAZINE) 10 MG  tablet Take 1 tablet (10 mg total) by mouth every 6 (six) hours as needed for nausea or vomiting.   No facility-administered encounter medications on file as of 06/12/2019.     PHYSICAL EXAM:   PE deferred d/t telephonic nature of visit. Patient is alert and conversant, without dyspnea. Spouse [name] in attendance.  Julianne Handler, NP

## 2019-06-15 ENCOUNTER — Other Ambulatory Visit: Payer: Self-pay

## 2019-06-25 ENCOUNTER — Other Ambulatory Visit: Payer: Medicare Other | Admitting: Licensed Clinical Social Worker

## 2019-06-25 ENCOUNTER — Encounter: Payer: Self-pay | Admitting: Internal Medicine

## 2019-06-25 ENCOUNTER — Other Ambulatory Visit: Payer: Self-pay

## 2019-06-25 ENCOUNTER — Other Ambulatory Visit: Payer: Medicare Other | Admitting: Internal Medicine

## 2019-06-25 DIAGNOSIS — Z515 Encounter for palliative care: Secondary | ICD-10-CM

## 2019-06-25 DIAGNOSIS — G629 Polyneuropathy, unspecified: Secondary | ICD-10-CM | POA: Diagnosis not present

## 2019-06-25 NOTE — Progress Notes (Signed)
Dec 3rd, 2020 [facility] Palliative Care Consult Note Telephone: [phone]  Fax: [phone]   PATIENT NAME: Edward Bailey DOB: [birth year] MRN: [id number] [address] [place] [id number] Pt home #:  [phone] Cell:  [phone]   PRIMARY CARE PROVIDER:   [name], MD [place] [address] [place], [place] [address] [facility] [address] [address]   REFERRING PROVIDER:   Dr. Franchot Gallo ([facility] [phone]) Dr. Zola Button ([place]  RESPONSIBLE PARTY:   Edward Bailey (wife) (H) [phone], [phone]) [phone]. (son) Edward Bailey ([phone] [email or web address]    ASSESSMENT / RECOMMENDATIONS:  1. Advance Care Planning: A. Directives: Has DNR and MOST.   B. Goals of Care: No further chemotherapy planned. Goal: not to be a burden to his wife.   2. Cognitive/functional status:  In better spirits, though still has occasional episodes of "emotional tearfulness when I feel sorry for myself" but denies feelings of depression. He copes by talking with and "leaning on" his very supportive spouse Edward Bailey. We discussed and normalized these feelings. Continues to work with PT and OT and feels it's been beneficial. Admits to not doing their prescribed exercises on a regular basis. We discussed setting up a schedule for this; marrying it to something else he already does tid (such as eating meals) and having an exercise buddy (his spouse Edward Bailey). Continues with LE neuropathy (pins/needles/numbness) feet; not much improvement with initiation of gabapentin 300mg  qhs, about 1 month earlier. Weight stable; 185lbs today (with sneakers); loss of about 2 lbs over the last month. At 6\' 2"  his BMI is around 24kg/m2. Feels appetite improved with use of Remeron 15mg  qhs. It's probably helping his mood, as well as with getting a good night's sleep.              -Will consult our [place] Duffy to  provide supportive counsel setting of life-threatening illness. I meant to send in the consult on my last visit but neglected to do so.     -I'll check with Dr. Alen Blew if can increase patient's Gabapentin dose to 300mg  bid to see if could improve symptoms neuropathy    3. Family Supports: Lives with supportive spouse Edward Bailey"; second marriage of 4  years. They both have very supportive children from previous marriages. We discussed hospice services, and that he is eligible for services by virtue of his cancer diagnosis, and no plans for aggressive treatment. Benefit would include hospice nurse being able to flush his port-a-cath at the home, so patient wouldn't have to keep going to the cancer center on a monthly basis for the flush. Patient wondered about risk vs benefit of taking out the port. I advised him to keep it just in case of future need, say for IV pain medication. At this time, patient wishes to continue with home PT/OT so wished to table any further hospice discussions.    4. Follow up Palliative Care Visit: Monday 07/27/2019 @noon .   I spent 60 minutes providing this consultation, from 12pm-1pm. More than 50% of the time in this consultation was spent coordinating communication.    HISTORY OF PRESENT ILLNESS:  Edward Bailey is a 83 y.o. male with prostate cancer (dx 1999; resection ,chemo; continues monthly Xgeva q mo and Depot Lurpon q 4 mo depending on testosterone level) with bony metastasis (dx 2017; spine and pelvis), anemia (Hbg 8.1), GERD, HTN, hyper/hypothyroid, and allergic rhinitis. He is s/p hospitalization 9/3-03/29/2019, where he  was treated for hyponatremia (SIADH with Na 125) and anemia (Hgb 6.8 tx 1 unit PRBCs). This is a f/u Palliative Care visit from Nov 20th.    CODE STATUS: DNR/MOST: DNR/DNI. Yes to full scope of medical care, but would wish to limit mechanical ventilation to 1 week or less. Yes to antibiotics, yes to IVFs, no to Tube feedings.   PPS: 40%    HOSPICE ELIGIBILITY/DIAGNOSIS: yes, pending need for services  PAST MEDICAL HISTORY:  Past Medical History:  Diagnosis Date  . GERD (gastroesophageal reflux disease)   . Hyperthyroidism   . Prostate cancer [facility])    prostate cancer radiation and surgery 15 years ago, Dr. Maceo Pro    SOCIAL HX:  Social History   Tobacco Use  . Smoking status: Never Smoker  . Smokeless tobacco: Never Used  Substance Use Topics  . Alcohol use: Yes    Comment: occasional    ALLERGIES:  Allergies  Allergen Reactions  . Amoxicillin Rash    Has patient had a PCN reaction causing immediate rash, facial/tongue/throat swelling, SOB or lightheadedness with hypotension: No Has patient had a PCN reaction causing severe rash involving mucus membranes or skin necrosis: No Has patient had a PCN reaction that required hospitalization No Has patient had a PCN reaction occurring within the last 10 years: Yes If all of the above answers are "NO", then may proceed with Cephalosporin use.      PERTINENT MEDICATIONS:  Outpatient Encounter Medications as of 06/25/2019  Medication Sig  . amLODipine (NORVASC) 5 MG tablet Take 5 mg by mouth daily.   Marland Kitchen azelastine (ASTELIN) 0.1 % nasal spray Place 1-2 sprays into both nostrils daily as needed for rhinitis or allergies.   . cetirizine (ZYRTEC) 10 MG tablet Take 10 mg by mouth daily as needed for allergies or rhinitis.   . fluticasone (FLONASE) 50 MCG/ACT nasal spray Place 1 spray into both nostrils daily as needed for allergies or rhinitis.   Marland Kitchen gabapentin (NEURONTIN) 300 MG capsule Take 1 capsule (300 mg total) by mouth at bedtime.  Marland Kitchen HYDROcodone-acetaminophen (NORCO) 5-325 MG tablet Take 1 tablet by mouth every 6 (six) hours as needed for moderate pain.  Marland Kitchen levothyroxine (SYNTHROID, LEVOTHROID) 112 MCG tablet Take 112 mcg by mouth See admin instructions. Take 112 mcg by mouth every other night, rotating with the 125 mcg strength  . levothyroxine (SYNTHROID, LEVOTHROID) 125  MCG tablet Take 125 mcg by mouth See admin instructions. Take 125 mcg by mouth every other night, rotating with the 112 mcg strength  . lidocaine-prilocaine (EMLA) cream Apply 1 application topically as needed. (Patient taking differently: Apply 1 application topically as needed (to numb). )  . megestrol (MEGACE) 400 MG/10ML suspension Take 10 mLs (400 mg total) by mouth daily.  . mirtazapine (REMERON) 15 MG tablet Take 15 mg by mouth at bedtime.  . Multiple Vitamins-Minerals (ONE-A-DAY MENS 50+) TABS Take 1 tablet by mouth daily with breakfast.  . prochlorperazine (COMPAZINE) 10 MG tablet Take 1 tablet (10 mg total) by mouth every 6 (six) hours as needed for nausea or vomiting.   No facility-administered encounter medications on file as of 06/25/2019.     PHYSICAL EXAM:   Well nourished, A & O, pleasantly conversant; occasionally tearful. Supportive spouse Edward Bailey in attendance.  Abbrevated PE to decreased COVID risk No dyspnea with conversation. Mild LE swelling Skin: no rashes exposed areas.  Neurological: Mild generalized weakness but otherwise non-focal  Julianne Handler, NP

## 2019-06-26 NOTE — Progress Notes (Signed)
COMMUNITY PALLIATIVE CARE SW NOTE  PATIENT NAME: Edward Bailey DOB: Dec 10, 1935 MRN: [id number]  PRIMARY CARE PROVIDER: [name], MD  RESPONSIBLE PARTY:  Acct ID - Guarantor Home Phone Work Phone Relationship Acct Type  [id number] ABEL, [id number]  Self P/F     [address], [place], [place] [address]   Due to the COVID-19 crisis, this virtual check-in visit was done via telephone from my office and it was initiated and consent by this patientand orfamily.  PLAN OF CARE and INTERVENTIONS:             1. GOALS OF CARE/ ADVANCE CARE PLANNING:  Goal for patient is to have the best quality of life possible.  He has a DNR and MOST form. 2. SOCIAL/EMOTIONAL/SPIRITUAL ASSESSMENT/ INTERVENTIONS:  Per the request if NP, Edward Bailey, SW contacted patient and his wife, Edward Bailey, to provide supportive counseling regarding patient's terminal illness.  The virtual visit was conducted during a conference call.  Patient appears to have a good understanding of his illness, although he is hoping for a new clinical trial.  He currently does not have any pain, which he is grateful for.  His previous chemotherapy treatments produced bad side effects, affecting his quality of life.   3. PATIENT/CAREGIVER EDUCATION/ COPING:  SW provided education regarding the Palliative Care SW role.  Both patient and Edward Bailey cope by problem-solving. 4. PERSONAL EMERGENCY PLAN:  Family contacts patient's MD. 5. COMMUNITY RESOURCES COORDINATION/ HEALTH CARE NAVIGATION:  Patient reports PT and OT will be starting. 6. FINANCIAL/LEGAL CONCERNS/INTERVENTIONS:  None identified.      SOCIAL HX:  Social History   Tobacco Use  . Smoking status: Never Smoker  . Smokeless tobacco: Never Used  Substance Use Topics  . Alcohol use: Yes    Comment: occasional    CODE STATUS:  DNR  ADVANCED DIRECTIVES: N MOST FORM COMPLETE:  Y HOSPICE EDUCATION PROVIDED:  Y PPS:  No appetite issues expressed.  Patient uses a walker to  ambulate. Duration of visit and documentation:  45 minutes.      Edward Corn Virgilia Quigg, LCSW

## 2019-06-27 ENCOUNTER — Other Ambulatory Visit: Payer: Self-pay

## 2019-06-27 ENCOUNTER — Inpatient Hospital Stay: Payer: Medicare Other | Attending: Oncology

## 2019-06-27 VITALS — BP 129/64 | HR 80 | Temp 98.1°F | Resp 20

## 2019-06-27 DIAGNOSIS — C61 Malignant neoplasm of prostate: Secondary | ICD-10-CM | POA: Insufficient documentation

## 2019-06-27 DIAGNOSIS — Z452 Encounter for adjustment and management of vascular access device: Secondary | ICD-10-CM | POA: Insufficient documentation

## 2019-06-27 DIAGNOSIS — D649 Anemia, unspecified: Secondary | ICD-10-CM | POA: Diagnosis not present

## 2019-06-27 DIAGNOSIS — C7951 Secondary malignant neoplasm of bone: Secondary | ICD-10-CM | POA: Insufficient documentation

## 2019-06-27 DIAGNOSIS — Z95828 Presence of other vascular implants and grafts: Secondary | ICD-10-CM

## 2019-06-27 MED ORDER — HEPARIN SOD (PORK) LOCK FLUSH 100 UNIT/ML IV SOLN
500.0000 [IU] | Freq: Once | INTRAVENOUS | Status: AC
  Administered 2019-06-27: 500 [IU]
  Filled 2019-06-27: qty 5

## 2019-06-27 MED ORDER — SODIUM CHLORIDE 0.9% FLUSH
10.0000 mL | Freq: Once | INTRAVENOUS | Status: AC
  Administered 2019-06-27: 10 mL
  Filled 2019-06-27: qty 10

## 2019-07-01 DIAGNOSIS — C61 Malignant neoplasm of prostate: Secondary | ICD-10-CM | POA: Diagnosis not present

## 2019-07-05 ENCOUNTER — Encounter: Payer: Self-pay | Admitting: Oncology

## 2019-07-07 ENCOUNTER — Other Ambulatory Visit: Payer: Self-pay

## 2019-07-07 ENCOUNTER — Encounter (INDEPENDENT_AMBULATORY_CARE_PROVIDER_SITE_OTHER): Payer: Self-pay | Admitting: Otolaryngology

## 2019-07-07 ENCOUNTER — Ambulatory Visit (INDEPENDENT_AMBULATORY_CARE_PROVIDER_SITE_OTHER): Payer: Medicare Other | Admitting: Otolaryngology

## 2019-07-07 VITALS — Temp 97.3°F

## 2019-07-07 DIAGNOSIS — H903 Sensorineural hearing loss, bilateral: Secondary | ICD-10-CM

## 2019-07-07 DIAGNOSIS — H6521 Chronic serous otitis media, right ear: Secondary | ICD-10-CM

## 2019-07-07 NOTE — Progress Notes (Signed)
HPI: Edward Bailey is a 83 y.o. male who presents for evaluation of right ear hearing loss. He noticed this suddenly a couple weeks ago on the right side only. He has bad hearing in both ears and has hearing aids from [employer]. He was seen by his audiologist and they told him it was not the hearing aids. He denies any ear pain or discomfort but has decreased hearing in the right ear with his hearing aid. Unfortunately he did not bring his hearing aid with him to the office today.  Past Medical History:  Diagnosis Date  . GERD (gastroesophageal reflux disease)   . Hyperthyroidism   . Prostate cancer [facility])    prostate cancer radiation and surgery 15 years ago, Dr. Maceo Pro   Past Surgical History:  Procedure Laterality Date  . COLONOSCOPY WITH PROPOFOL N/A 06/13/2015   Procedure: COLONOSCOPY WITH PROPOFOL;  Surgeon: Garlan Fair, MD;  Location: [facility] ENDOSCOPY;  Service: Endoscopy;  Laterality: N/A;  . IR IMAGING GUIDED PORT INSERTION  08/19/2018  . PROSTATE SURGERY    . TONSILLECTOMY     Social History   Socioeconomic History  . Marital status: Married    Spouse name: Not on file  . Number of children: Not on file  . Years of education: Not on file  . Highest education level: Not on file  Occupational History  . Occupation: retired    Comment: Designer, fashion/clothing  Tobacco Use  . Smoking status: Never Smoker  . Smokeless tobacco: Never Used  Substance and Sexual Activity  . Alcohol use: Yes    Comment: occasional  . Drug use: No  . Sexual activity: Not Currently  Other Topics Concern  . Not on file  Social History Narrative   Resides in [place].   Social Determinants of Health   Financial Resource Strain:   . Difficulty of Paying Living Expenses: Not on file  Food Insecurity:   . Worried About Charity fundraiser in the Last Year: Not on file  . Ran Out of Food in the Last Year: Not on file  Transportation Needs:   . Lack of Transportation (Medical): Not on  file  . Lack of Transportation (Non-Medical): Not on file  Physical Activity:   . Days of Exercise per Week: Not on file  . Minutes of Exercise per Session: Not on file  Stress:   . Feeling of Stress : Not on file  Social Connections:   . Frequency of Communication with Friends and Family: Not on file  . Frequency of Social Gatherings with Friends and Family: Not on file  . Attends Religious Services: Not on file  . Active Member of Clubs or Organizations: Not on file  . Attends Archivist Meetings: Not on file  . Marital Status: Not on file   Family History  Problem Relation Age of Onset  . Prostate cancer Brother   . Prostate cancer Brother    Allergies  Allergen Reactions  . Amoxicillin Rash    Has patient had a PCN reaction causing immediate rash, facial/tongue/throat swelling, SOB or lightheadedness with hypotension: No Has patient had a PCN reaction causing severe rash involving mucus membranes or skin necrosis: No Has patient had a PCN reaction that required hospitalization No Has patient had a PCN reaction occurring within the last 10 years: Yes If all of the above answers are "NO", then may proceed with Cephalosporin use.    Prior to Admission medications   Medication Sig Start  Date End Date Taking? Authorizing Provider  azelastine (ASTELIN) 0.1 % nasal spray Place 1-2 sprays into both nostrils daily as needed for rhinitis or allergies.  04/21/15  Yes [provider]  cetirizine (ZYRTEC) 10 MG tablet Take 10 mg by mouth daily as needed for allergies or rhinitis.    Yes [provider]  fluticasone (FLONASE) 50 MCG/ACT nasal spray Place 1 spray into both nostrils daily as needed for allergies or rhinitis.  04/21/15  Yes [provider]  gabapentin (NEURONTIN) 300 MG capsule Take 1 capsule (300 mg total) by mouth at bedtime. Patient taking differently: Take 300 mg by mouth 2 (two) times daily.  06/04/19  Yes Wyatt Portela, MD   HYDROcodone-acetaminophen (NORCO) 5-325 MG tablet Take 1 tablet by mouth every 6 (six) hours as needed for moderate pain. 04/06/19  Yes Wyatt Portela, MD  levothyroxine (SYNTHROID, LEVOTHROID) 112 MCG tablet Take 112 mcg by mouth See admin instructions. Take 112 mcg by mouth every other night, rotating with the 125 mcg strength 04/02/16  Yes [provider]  levothyroxine (SYNTHROID, LEVOTHROID) 125 MCG tablet Take 125 mcg by mouth See admin instructions. Take 125 mcg by mouth every other night, rotating with the 112 mcg strength 06/12/16  Yes [provider]  lidocaine-prilocaine (EMLA) cream Apply 1 application topically as needed. Patient taking differently: Apply 1 application topically as needed (to numb).  08/06/18  Yes Wyatt Portela, MD  megestrol (MEGACE) 400 MG/10ML suspension Take 10 mLs (400 mg total) by mouth daily. 03/26/19  Yes Wyatt Portela, MD  mirtazapine (REMERON) 15 MG tablet Take 15 mg by mouth at bedtime.   Yes [provider]  Multiple Vitamins-Minerals (ONE-A-DAY MENS 50+) TABS Take 1 tablet by mouth daily with breakfast.   Yes [provider]  prochlorperazine (COMPAZINE) 10 MG tablet Take 1 tablet (10 mg total) by mouth every 6 (six) hours as needed for nausea or vomiting. 08/06/18  Yes Wyatt Portela, MD     Positive ROS: Mild nasal congestion otherwise negative  All other systems have been reviewed and were otherwise negative with the exception of those mentioned in the HPI and as above.  Physical Exam: Constitutional: Alert, well-appearing, no acute distress Ears: External ears without lesions or tenderness. Ear canals are clear bilaterally. Left TM is clear right TM reveals a serous otitis media. A myringotomy was performed in the office today using phenol on the right side only. A serous effusion was aspirated with improved hearing. Nasal: External nose without lesions. Septum mild deformity. Moderate rhinitis. Clear  nasopharynx.. Clear nasal passages Oral: Lips and gums without lesions. Tongue and palate mucosa without lesions. Posterior oropharynx clear. Neck: No palpable adenopathy or masses Respiratory: Breathing comfortably  Skin: No facial/neck lesions or rash noted.  Myringotomy  Date/Time: 07/07/2019 2:59 PM Performed by: Rozetta Nunnery, MD Authorized by: Rozetta Nunnery, MD   Consent:    Consent obtained:  Verbal   Consent given by:  Patient   Risks discussed:  Pain Pre-procedure details:    Indications: serous otitis media   Procedure Details:    Location:  Right TM   Hole made in:  Inferior aspect of TM   Tube:  None Findings:    Fluid:  Serous fluid Post-procedure details:    Patient tolerance of procedure:  Tolerated with difficulty Comments:     Myringotomy was was performed on the right side with a serous effusion aspirated.    Assessment: Right serous  otitis media with conductive hearing loss on tuning fork testing. Bilateral SNHL  Plan: Myringotomy was performed in the office today. Also prescribed Nasacort 2 sprays each nostril at night. He will follow-up if he notices any worsening of his hearing over the next month. Discussed with him that the myringotomy site will heal within 2 weeks.  Radene Journey, MD

## 2019-07-16 ENCOUNTER — Other Ambulatory Visit: Payer: Self-pay

## 2019-07-16 ENCOUNTER — Telehealth: Payer: Self-pay

## 2019-07-16 DIAGNOSIS — D649 Anemia, unspecified: Secondary | ICD-10-CM

## 2019-07-16 NOTE — Telephone Encounter (Signed)
Patient scheduled for lab work on 07/23/19 and possible blood transfusion on 07/24/19. Patient is aware of all appointment dates and times and verbalized understanding.

## 2019-07-16 NOTE — Telephone Encounter (Signed)
-----   Message from Wyatt Portela, MD sent at 07/16/2019 11:04 AM EST ----- Sure, Next available is fine in the next 2 weeks. ----- Message ----- From: Tami Lin, RN Sent: 07/16/2019  10:49 AM EST To: Wyatt Portela, MD  Mr. Tlatelpa states he saw Dr. Laurann Montana this week and his hemoglobin is 8.1. Mr. Detoro states that Dr. Laurann Montana told him to contact you about getting a blood transfusion in the next week or two due to his hemoglobin and fatigue. I called Dr. Delene Ruffini office and left a voicemail requesting them to fax over lab work. Do you want me to go ahead and get him scheduled for labs and possible transfusion? Lanelle Bal

## 2019-07-20 ENCOUNTER — Ambulatory Visit (INDEPENDENT_AMBULATORY_CARE_PROVIDER_SITE_OTHER): Payer: Medicare Other | Admitting: Otolaryngology

## 2019-07-20 ENCOUNTER — Other Ambulatory Visit: Payer: Self-pay

## 2019-07-20 ENCOUNTER — Encounter (INDEPENDENT_AMBULATORY_CARE_PROVIDER_SITE_OTHER): Payer: Self-pay | Admitting: Otolaryngology

## 2019-07-20 VITALS — Temp 97.9°F

## 2019-07-20 DIAGNOSIS — H6981 Other specified disorders of Eustachian tube, right ear: Secondary | ICD-10-CM | POA: Diagnosis not present

## 2019-07-20 NOTE — Progress Notes (Signed)
HPI: Edward Bailey is a 83 y.o. male who returns today for evaluation of right ear with decreased hearing.  He wears bilateral hearing aids from [employer].  He is status post a right myringotomy performed 2 weeks ago for right serous otitis media.  His hearing was initially better but over the past couple of days has gotten worse only on the right side.  He has been using Nasacort at night.  No pain in the right ear.  No upper respiratory symptoms.  Past Medical History:  Diagnosis Date  . GERD (gastroesophageal reflux disease)   . Hyperthyroidism   . Prostate cancer [facility])    prostate cancer radiation and surgery 15 years ago, Dr. Maceo Pro   Past Surgical History:  Procedure Laterality Date  . COLONOSCOPY WITH PROPOFOL N/A 06/13/2015   Procedure: COLONOSCOPY WITH PROPOFOL;  Surgeon: Garlan Fair, MD;  Location: [facility];  Service: Endoscopy;  Laterality: N/A;  . IR IMAGING GUIDED PORT INSERTION  08/19/2018  . PROSTATE SURGERY    . TONSILLECTOMY     Social History   Socioeconomic History  . Marital status: Married    Spouse name: Not on file  . Number of children: Not on file  . Years of education: Not on file  . Highest education level: Not on file  Occupational History  . Occupation: retired    Comment: Designer, fashion/clothing  Tobacco Use  . Smoking status: Never Smoker  . Smokeless tobacco: Never Used  Substance and Sexual Activity  . Alcohol use: Yes    Comment: occasional  . Drug use: No  . Sexual activity: Not Currently  Other Topics Concern  . Not on file  Social History Narrative   Resides in [place].   Social Determinants of Health   Financial Resource Strain:   . Difficulty of Paying Living Expenses: Not on file  Food Insecurity:   . Worried About Charity fundraiser in the Last Year: Not on file  . Ran Out of Food in the Last Year: Not on file  Transportation Needs:   . Lack of Transportation (Medical): Not on file  . Lack of Transportation  (Non-Medical): Not on file  Physical Activity:   . Days of Exercise per Week: Not on file  . Minutes of Exercise per Session: Not on file  Stress:   . Feeling of Stress : Not on file  Social Connections:   . Frequency of Communication with Friends and Family: Not on file  . Frequency of Social Gatherings with Friends and Family: Not on file  . Attends Religious Services: Not on file  . Active Member of Clubs or Organizations: Not on file  . Attends Archivist Meetings: Not on file  . Marital Status: Not on file   Family History  Problem Relation Age of Onset  . Prostate cancer Brother   . Prostate cancer Brother    Allergies  Allergen Reactions  . Amoxicillin Rash    Has patient had a PCN reaction causing immediate rash, facial/tongue/throat swelling, SOB or lightheadedness with hypotension: No Has patient had a PCN reaction causing severe rash involving mucus membranes or skin necrosis: No Has patient had a PCN reaction that required hospitalization No Has patient had a PCN reaction occurring within the last 10 years: Yes If all of the above answers are "NO", then may proceed with Cephalosporin use.    Prior to Admission medications   Medication Sig Start Date End Date Taking? Authorizing Provider  azelastine (ASTELIN) 0.1 % nasal spray Place 1-2 sprays into both nostrils daily as needed for rhinitis or allergies.  04/21/15  Yes [provider]  cetirizine (ZYRTEC) 10 MG tablet Take 10 mg by mouth daily as needed for allergies or rhinitis.    Yes [provider]  fluticasone (FLONASE) 50 MCG/ACT nasal spray Place 1 spray into both nostrils daily as needed for allergies or rhinitis.  04/21/15  Yes [provider]  gabapentin (NEURONTIN) 300 MG capsule Take 1 capsule (300 mg total) by mouth at bedtime. Patient taking differently: Take 300 mg by mouth 2 (two) times daily.  06/04/19  Yes Wyatt Portela, MD  HYDROcodone-acetaminophen (NORCO) 5-325  MG tablet Take 1 tablet by mouth every 6 (six) hours as needed for moderate pain. 04/06/19  Yes Wyatt Portela, MD  levothyroxine (SYNTHROID, LEVOTHROID) 112 MCG tablet Take 112 mcg by mouth See admin instructions. Take 112 mcg by mouth every other night, rotating with the 125 mcg strength 04/02/16  Yes [provider]  levothyroxine (SYNTHROID, LEVOTHROID) 125 MCG tablet Take 125 mcg by mouth See admin instructions. Take 125 mcg by mouth every other night, rotating with the 112 mcg strength 06/12/16  Yes [provider]  lidocaine-prilocaine (EMLA) cream Apply 1 application topically as needed. Patient taking differently: Apply 1 application topically as needed (to numb).  08/06/18  Yes Wyatt Portela, MD  megestrol (MEGACE) 400 MG/10ML suspension Take 10 mLs (400 mg total) by mouth daily. 03/26/19  Yes Wyatt Portela, MD  mirtazapine (REMERON) 15 MG tablet Take 15 mg by mouth at bedtime.   Yes [provider]  Multiple Vitamins-Minerals (ONE-A-DAY MENS 50+) TABS Take 1 tablet by mouth daily with breakfast.   Yes [provider]  prochlorperazine (COMPAZINE) 10 MG tablet Take 1 tablet (10 mg total) by mouth every 6 (six) hours as needed for nausea or vomiting. 08/06/18  Yes Wyatt Portela, MD     Positive ROS: Negative  All other systems have been reviewed and were otherwise negative with the exception of those mentioned in the HPI and as above.  Physical Exam: Constitutional: Alert, well-appearing, no acute distress Ears: External ears without lesions or tenderness. Ear canals are clear bilaterally.  Left TM is clear.  Right TM is slightly retracted.  I was able to insufflate some air behind the right TM with slight improvement of his hearing.  On tuning fork testing AC equivocal to [facility] on the right side. Nasal: External nose without lesions. Septum relatively midline.. Clear nasal passages.  No signs of infection. Oral: Lips and gums without lesions. Tongue and  palate mucosa without lesions. Posterior oropharynx clear. Neck: No palpable adenopathy or masses Respiratory: Breathing comfortably  Skin: No facial/neck lesions or rash noted.  Procedures  Assessment: Right eustachian tube dysfunction with mild right conductive hearing loss.  Plan: Recommended regular use of Nasacort 2 sprays each nostril at night.  Try to pop his ear once a day in the morning. He will follow-up in 2 weeks for recheck and audiologic testing.  If he has significant conductive hearing loss consider placement of right M & T. He will follow-up in 2 weeks for recheck.   Radene Journey, MD

## 2019-07-23 ENCOUNTER — Other Ambulatory Visit: Payer: Self-pay

## 2019-07-23 ENCOUNTER — Telehealth: Payer: Self-pay

## 2019-07-23 ENCOUNTER — Inpatient Hospital Stay: Payer: Medicare Other

## 2019-07-23 DIAGNOSIS — C7951 Secondary malignant neoplasm of bone: Secondary | ICD-10-CM | POA: Diagnosis not present

## 2019-07-23 DIAGNOSIS — D649 Anemia, unspecified: Secondary | ICD-10-CM

## 2019-07-23 DIAGNOSIS — C61 Malignant neoplasm of prostate: Secondary | ICD-10-CM | POA: Diagnosis not present

## 2019-07-23 DIAGNOSIS — Z452 Encounter for adjustment and management of vascular access device: Secondary | ICD-10-CM | POA: Diagnosis not present

## 2019-07-23 LAB — CBC WITH DIFFERENTIAL (CANCER CENTER ONLY)
Abs Immature Granulocytes: 0.03 10*3/uL (ref 0.00–0.07)
Basophils Absolute: 0 10*3/uL (ref 0.0–0.1)
Basophils Relative: 0 %
Eosinophils Absolute: 0.1 10*3/uL (ref 0.0–0.5)
Eosinophils Relative: 3 %
HCT: 23.9 % — ABNORMAL LOW (ref 39.0–52.0)
Hemoglobin: 7.5 g/dL — ABNORMAL LOW (ref 13.0–17.0)
Immature Granulocytes: 1 %
Lymphocytes Relative: 10 %
Lymphs Abs: 0.4 10*3/uL — ABNORMAL LOW (ref 0.7–4.0)
MCH: 28.5 pg (ref 26.0–34.0)
MCHC: 31.4 g/dL (ref 30.0–36.0)
MCV: 90.9 fL (ref 80.0–100.0)
Monocytes Absolute: 0.4 10*3/uL (ref 0.1–1.0)
Monocytes Relative: 10 %
Neutro Abs: 3.1 10*3/uL (ref 1.7–7.7)
Neutrophils Relative %: 76 %
Platelet Count: 180 10*3/uL (ref 150–400)
RBC: 2.63 MIL/uL — ABNORMAL LOW (ref 4.22–5.81)
RDW: 21.9 % — ABNORMAL HIGH (ref 11.5–15.5)
WBC Count: 4 10*3/uL (ref 4.0–10.5)
nRBC: 0 % (ref 0.0–0.2)

## 2019-07-23 LAB — SAMPLE TO BLOOD BANK

## 2019-07-23 LAB — PREPARE RBC (CROSSMATCH)

## 2019-07-23 NOTE — Progress Notes (Signed)
Dr. Alen Blew made aware patient's hemoglobin is 7.5. Per Dr. Alen Blew orders placed for 2 units of blood to be transfused on 07/24/19. Patient is aware and verbalized understanding. Spoke to [place] in blood bank to confirm orders received.

## 2019-07-23 NOTE — Telephone Encounter (Signed)
Called and reminded patient not to remove blue blood bank bracelet and he verbalized understanding.

## 2019-07-24 ENCOUNTER — Inpatient Hospital Stay: Payer: Medicare Other | Attending: Oncology

## 2019-07-24 DIAGNOSIS — D649 Anemia, unspecified: Secondary | ICD-10-CM | POA: Insufficient documentation

## 2019-07-24 MED ORDER — HEPARIN SOD (PORK) LOCK FLUSH 100 UNIT/ML IV SOLN
500.0000 [IU] | Freq: Every day | INTRAVENOUS | Status: AC | PRN
  Administered 2019-07-24: 500 [IU]
  Filled 2019-07-24: qty 5

## 2019-07-24 MED ORDER — ACETAMINOPHEN 325 MG PO TABS
ORAL_TABLET | ORAL | Status: AC
  Filled 2019-07-24: qty 2

## 2019-07-24 MED ORDER — SODIUM CHLORIDE 0.9% FLUSH
10.0000 mL | INTRAVENOUS | Status: AC | PRN
  Administered 2019-07-24: 10 mL
  Filled 2019-07-24: qty 10

## 2019-07-24 MED ORDER — DIPHENHYDRAMINE HCL 25 MG PO CAPS
25.0000 mg | ORAL_CAPSULE | Freq: Once | ORAL | Status: AC
  Administered 2019-07-24: 25 mg via ORAL

## 2019-07-24 MED ORDER — SODIUM CHLORIDE 0.9% IV SOLUTION
250.0000 mL | Freq: Once | INTRAVENOUS | Status: AC
  Administered 2019-07-24: 250 mL via INTRAVENOUS
  Filled 2019-07-24: qty 250

## 2019-07-24 MED ORDER — DIPHENHYDRAMINE HCL 25 MG PO CAPS
ORAL_CAPSULE | ORAL | Status: AC
  Filled 2019-07-24: qty 1

## 2019-07-24 MED ORDER — ACETAMINOPHEN 325 MG PO TABS
650.0000 mg | ORAL_TABLET | Freq: Once | ORAL | Status: AC
  Administered 2019-07-24: 650 mg via ORAL

## 2019-07-24 NOTE — Patient Instructions (Signed)

## 2019-07-25 LAB — TYPE AND SCREEN
ABO/RH(D): O POS
Antibody Screen: NEGATIVE
Unit division: 0
Unit division: 0

## 2019-07-25 LAB — BPAM RBC
Blood Product Expiration Date: 202101102359
Blood Product Expiration Date: 202101302359
ISSUE DATE / TIME: 202101010916
ISSUE DATE / TIME: 202101010916
Unit Type and Rh: 5100
Unit Type and Rh: 5100

## 2019-07-27 ENCOUNTER — Other Ambulatory Visit: Payer: Self-pay

## 2019-07-27 ENCOUNTER — Encounter: Payer: Self-pay | Admitting: Internal Medicine

## 2019-07-27 ENCOUNTER — Other Ambulatory Visit: Payer: Medicare Other | Admitting: Internal Medicine

## 2019-07-27 DIAGNOSIS — Z515 Encounter for palliative care: Secondary | ICD-10-CM

## 2019-07-27 DIAGNOSIS — R29898 Other symptoms and signs involving the musculoskeletal system: Secondary | ICD-10-CM

## 2019-07-27 NOTE — Progress Notes (Signed)
07/27/2019 [facility] Consult Note Telephone: [phone]  Fax: [phone]   PATIENT NAME: Edward Bailey DOB: February 02, 1936 MRN: [id number] [address] [place] [id number] Pt home #:  [phone] Cell:  [phone]   PRIMARY CARE PROVIDER:   [name], MD [place] [employer] [address] [place], [place] [address] [facility] [address]   REFERRING PROVIDER:   Dr. Franchot Gallo ([facility] [phone]) Dr. Zola Button ([place]  RESPONSIBLE PARTY:   Joycelyn Schmid (wife) ([phone]) [phone], [phone]) [phone]. (son) Bernardo Jodoin ([phone] [email or web address]   ASSESSMENT / RECOMMENDATIONS:  1. Advance Care Planning: A. Directives: Has DNR and MOST.   B. Goals of Care: To continue with strength building exercises, enjoy his time with spouse playing Rummy Cube.   2. Cognitive/functional status: Feeling he has improved in strength and spirits. His hair is growing back. He was recently (3 days earlier) transfused with 2 units PRBC's for a HGB of 7.5. He wasn't particularly symptomatic with his anemia; he had his labs checked as periodic surveillance. He says he really doesn't feel any different after transfusion. Patient is independent in his ADLs. He's doing exercises as prescribed by PT and plans to do so on a more regular basis. PT/OT discharged patient last week as he had "plateaued". He no longer has symptoms of depression. He's eating with good appetite, off his appetite stimulants. He's gained 4 lbs, to a current weight of 189lbs. At a height of 6'2" his BMI is 24kg/m2. His LE neuropathy is much improved with increase of gabapentin to bid, though he continues with tingling/numbness of his chin.  -Patient plans to check with his urologist Dr.Stephen Dahlstedt, to see if he could continue his Q 4 month Lupron shots. Patient is no longer taking his oral chemo (monthly Xtandi).               3.  Family Supports: Lives with supportive spouse Joycelyn Schmid "[name]"; second marriage of 4  years. They both have very supportive children from previous marriages. Hospice discussions tabled as patient subjectively feeling so much better.    4. Follow up Palliative Care Visit: 09/21/2019 @ 11:30am    I spent 60 minutes providing this consultation, from 12pm-1pm. More than 50% of the time in this consultation was spent coordinating communication.    HISTORY OF PRESENT ILLNESS:  Edward Bailey is a 84 y.o. male with prostate cancer (dx 1999; resection, prior monthly Xtandi q mo and Depot Lurpon q 4 mo depending on testosterone level) with bony metastasis (dx 2017; spine and pelvis), anemia (Hbg 8.1), GERD, HTN, hyper/hypothyroid, and allergic rhinitis. He is s/p hospitalization 9/3-03/29/2019, where he was treated for hyponatremia (SIADH with Na 125) and anemia (Hgb 6.8 tx 1 unit PRBCs). This is a f/u Palliative Care visit from Nov 20th.    CODE STATUS: DNR/MOST: DNR/DNI. Yes to full scope of medical care, but would wish to limit mechanical ventilation to 1 week or less. Yes to antibiotics, yes to IVFs, no to Tube feedings.   PPS: 70%   HOSPICE ELIGIBILITY/DIAGNOSIS: yes, pending need for services  PAST MEDICAL HISTORY:  Past Medical History:  Diagnosis Date  . GERD (gastroesophageal reflux disease)   . Hyperthyroidism   . Prostate cancer [facility]    prostate cancer radiation and surgery 15 years ago, Dr. Maceo Pro    SOCIAL HX:  Social History   Tobacco Use  . Smoking status: Never Smoker  . Smokeless tobacco: Never Used  Substance Use Topics  . Alcohol use: Yes    Comment: occasional    ALLERGIES:  Allergies  Allergen Reactions  . Amoxicillin Rash    Has patient had a PCN reaction causing immediate rash, facial/tongue/throat swelling, SOB or lightheadedness with hypotension: No Has patient had a PCN reaction causing severe rash involving mucus membranes or skin necrosis: No Has patient  had a PCN reaction that required hospitalization No Has patient had a PCN reaction occurring within the last 10 years: Yes If all of the above answers are "NO", then may proceed with Cephalosporin use.      PERTINENT MEDICATIONS:  Outpatient Encounter Medications as of 07/27/2019  Medication Sig  . azelastine (ASTELIN) 0.1 % nasal spray Place 1-2 sprays into both nostrils daily as needed for rhinitis or allergies.   . cetirizine (ZYRTEC) 10 MG tablet Take 10 mg by mouth daily as needed for allergies or rhinitis.   Marland Kitchen gabapentin (NEURONTIN) 300 MG capsule Take 1 capsule (300 mg total) by mouth at bedtime. (Patient taking differently: Take 300 mg by mouth 2 (two) times daily. )  . levothyroxine (SYNTHROID, LEVOTHROID) 112 MCG tablet Take 112 mcg by mouth See admin instructions. Take 112 mcg by mouth every other night, rotating with the 125 mcg strength  . levothyroxine (SYNTHROID, LEVOTHROID) 125 MCG tablet Take 125 mcg by mouth See admin instructions. Take 125 mcg by mouth every other night, rotating with the 112 mcg strength  . lidocaine-prilocaine (EMLA) cream Apply 1 application topically as needed. (Patient taking differently: Apply 1 application topically as needed (to numb). )  . Multiple Vitamins-Minerals (ONE-A-DAY MENS 50+) TABS Take 1 tablet by mouth daily with breakfast.  . fluticasone (FLONASE) 50 MCG/ACT nasal spray Place 1 spray into both nostrils daily as needed for allergies or rhinitis.   Marland Kitchen HYDROcodone-acetaminophen (NORCO) 5-325 MG tablet Take 1 tablet by mouth every 6 (six) hours as needed for moderate pain. (Patient not taking: Reported on 07/27/2019)  . prochlorperazine (COMPAZINE) 10 MG tablet Take 1 tablet (10 mg total) by mouth every 6 (six) hours as needed for nausea or vomiting. (Patient not taking: Reported on 07/27/2019)  . [DISCONTINUED] megestrol (MEGACE) 400 MG/10ML suspension Take 10 mLs (400 mg total) by mouth daily.  . [DISCONTINUED] mirtazapine (REMERON) 15 MG tablet  Take 15 mg by mouth at bedtime.   No facility-administered encounter medications on file as of 07/27/2019.    PHYSICAL EXAM:   BP 112/64. O2 sats 98% RA, HT 80   Well nourished, A & O, pleasantly conversant, in good spirits. Supportive spouse [name] in attendance.  Abbrevated PE to decreased COVID risk No dyspnea with conversation. No LE swelling Skin: no rashes exposed areas.  Neurological: otherwise non-focal. Uses walker for stability. Independent in transfers.  Julianne Handler, NP

## 2019-08-04 ENCOUNTER — Ambulatory Visit (INDEPENDENT_AMBULATORY_CARE_PROVIDER_SITE_OTHER): Payer: Medicare Other | Admitting: Otolaryngology

## 2019-08-04 DIAGNOSIS — H903 Sensorineural hearing loss, bilateral: Secondary | ICD-10-CM | POA: Diagnosis not present

## 2019-08-04 DIAGNOSIS — H9313 Tinnitus, bilateral: Secondary | ICD-10-CM | POA: Diagnosis not present

## 2019-08-05 DIAGNOSIS — C61 Malignant neoplasm of prostate: Secondary | ICD-10-CM | POA: Diagnosis not present

## 2019-08-05 DIAGNOSIS — Z5111 Encounter for antineoplastic chemotherapy: Secondary | ICD-10-CM | POA: Diagnosis not present

## 2019-08-08 ENCOUNTER — Ambulatory Visit: Payer: Medicare Other | Attending: Internal Medicine

## 2019-08-08 DIAGNOSIS — Z23 Encounter for immunization: Secondary | ICD-10-CM | POA: Insufficient documentation

## 2019-08-08 NOTE — Progress Notes (Signed)
   Covid-19 Vaccination Clinic  Name:  ELPHEGE PORRAS    MRN: [id number] DOB: [birth year]  08/08/2019  Mr. Rammel was observed post Covid-19 immunization for 15 minutes without incidence. He was provided with Vaccine Information Sheet and instruction to access the V-Safe system.   Mr. Dejardin was instructed to call 911 with any severe reactions post vaccine: Marland Kitchen Difficulty breathing  . Swelling of your face and throat  . A fast heartbeat  . A bad rash all over your body  . Dizziness and weakness    Immunizations Administered    Name Date Dose VIS Date Route   Pfizer COVID-19 Vaccine 08/08/2019 11:38 AM 0.3 mL 07/03/2019 Intramuscular   Manufacturer: [place]   Lot: [id number]   [place]: [id number]

## 2019-08-13 ENCOUNTER — Ambulatory Visit (INDEPENDENT_AMBULATORY_CARE_PROVIDER_SITE_OTHER): Payer: Medicare Other | Admitting: Otolaryngology

## 2019-08-13 ENCOUNTER — Other Ambulatory Visit: Payer: Self-pay

## 2019-08-13 VITALS — Temp 96.1°F

## 2019-08-13 DIAGNOSIS — H903 Sensorineural hearing loss, bilateral: Secondary | ICD-10-CM

## 2019-08-13 DIAGNOSIS — H6521 Chronic serous otitis media, right ear: Secondary | ICD-10-CM

## 2019-08-13 NOTE — Progress Notes (Signed)
HPI: Edward Bailey is a 84 y.o. male who returns today for evaluation of hearing.  Status post hearing test 9 days ago.  He is hearing better today compared to previous evaluation couple months ago.  He wears bilateral hearing aids.  On review of the audiogram he has moderate severe downsloping sensorineural hearing loss in both ears with a very mild conductive component on the right side.  Type B tympanogram in the right ear and type A tympanogram on the left..  Past Medical History:  Diagnosis Date  . GERD (gastroesophageal reflux disease)   . Hyperthyroidism   . Prostate cancer [facility])    prostate cancer radiation and surgery 15 years ago, Dr. Maceo Pro   Past Surgical History:  Procedure Laterality Date  . COLONOSCOPY WITH PROPOFOL N/A 06/13/2015   Procedure: COLONOSCOPY WITH PROPOFOL;  Surgeon: Garlan Fair, MD;  Location: [facility];  Service: Endoscopy;  Laterality: N/A;  . IR IMAGING GUIDED PORT INSERTION  08/19/2018  . PROSTATE SURGERY    . TONSILLECTOMY     Social History   Socioeconomic History  . Marital status: Married    Spouse name: Not on file  . Number of children: Not on file  . Years of education: Not on file  . Highest education level: Not on file  Occupational History  . Occupation: retired    Comment: Designer, fashion/clothing  Tobacco Use  . Smoking status: Never Smoker  . Smokeless tobacco: Never Used  Substance and Sexual Activity  . Alcohol use: Yes    Comment: occasional  . Drug use: No  . Sexual activity: Not Currently  Other Topics Concern  . Not on file  Social History Narrative   Resides in [place].   Social Determinants of Health   Financial Resource Strain:   . Difficulty of Paying Living Expenses: Not on file  Food Insecurity:   . Worried About Charity fundraiser in the Last Year: Not on file  . Ran Out of Food in the Last Year: Not on file  Transportation Needs:   . Lack of Transportation (Medical): Not on file  . Lack of  Transportation (Non-Medical): Not on file  Physical Activity:   . Days of Exercise per Week: Not on file  . Minutes of Exercise per Session: Not on file  Stress:   . Feeling of Stress : Not on file  Social Connections:   . Frequency of Communication with Friends and Family: Not on file  . Frequency of Social Gatherings with Friends and Family: Not on file  . Attends Religious Services: Not on file  . Active Member of Clubs or Organizations: Not on file  . Attends Archivist Meetings: Not on file  . Marital Status: Not on file   Family History  Problem Relation Age of Onset  . Prostate cancer Brother   . Prostate cancer Brother    Allergies  Allergen Reactions  . Amoxicillin Rash    Has patient had a PCN reaction causing immediate rash, facial/tongue/throat swelling, SOB or lightheadedness with hypotension: No Has patient had a PCN reaction causing severe rash involving mucus membranes or skin necrosis: No Has patient had a PCN reaction that required hospitalization No Has patient had a PCN reaction occurring within the last 10 years: Yes If all of the above answers are "NO", then may proceed with Cephalosporin use.    Prior to Admission medications   Medication Sig Start Date End Date Taking? Authorizing Provider  azelastine (  ASTELIN) 0.1 % nasal spray Place 1-2 sprays into both nostrils daily as needed for rhinitis or allergies.  04/21/15   [provider]  cetirizine (ZYRTEC) 10 MG tablet Take 10 mg by mouth daily as needed for allergies or rhinitis.     [provider]  fluticasone (FLONASE) 50 MCG/ACT nasal spray Place 1 spray into both nostrils daily as needed for allergies or rhinitis.  04/21/15   [provider]  gabapentin (NEURONTIN) 300 MG capsule Take 1 capsule (300 mg total) by mouth at bedtime. Patient taking differently: Take 300 mg by mouth 2 (two) times daily.  06/04/19   Wyatt Portela, MD  HYDROcodone-acetaminophen (NORCO)  5-325 MG tablet Take 1 tablet by mouth every 6 (six) hours as needed for moderate pain. Patient not taking: Reported on 07/27/2019 04/06/19   Wyatt Portela, MD  levothyroxine (SYNTHROID, LEVOTHROID) 112 MCG tablet Take 112 mcg by mouth See admin instructions. Take 112 mcg by mouth every other night, rotating with the 125 mcg strength 04/02/16   [provider]  levothyroxine (SYNTHROID, LEVOTHROID) 125 MCG tablet Take 125 mcg by mouth See admin instructions. Take 125 mcg by mouth every other night, rotating with the 112 mcg strength 06/12/16   [provider]  lidocaine-prilocaine (EMLA) cream Apply 1 application topically as needed. Patient taking differently: Apply 1 application topically as needed (to numb).  08/06/18   Wyatt Portela, MD  Multiple Vitamins-Minerals (ONE-A-DAY MENS 50+) TABS Take 1 tablet by mouth daily with breakfast.    [provider]  prochlorperazine (COMPAZINE) 10 MG tablet Take 1 tablet (10 mg total) by mouth every 6 (six) hours as needed for nausea or vomiting. Patient not taking: Reported on 07/27/2019 08/06/18   Wyatt Portela, MD     Positive ROS: No other complaints  All other systems have been reviewed and were otherwise negative with the exception of those mentioned in the HPI and as above.  Physical Exam: Constitutional: Alert, well-appearing, no acute distress Ears: External ears without lesions or tenderness. Ear canals are clear bilaterally.  Left TM is clear.  Right TM is retracted with minimal right serous otitis.  I was able to insufflate air behind the right TM without difficulty. Nasal: External nose without lesions. Septum minimal deformity. Clear nasal passages mild rhinitis with no signs of infection Oral: Lips and gums without lesions. Tongue and palate mucosa without lesions. Posterior oropharynx clear. Neck: No palpable adenopathy or masses Respiratory: Breathing comfortably  Skin: No facial/neck lesions or rash  noted.  Procedures  Assessment: Right eustachian tube dysfunction with minimal right serous otitis media.  Overall he is doing much better.  Plan: Recommended continue use of nasal steroid spray on a regular basis and popping his ear daily.  If his hearing becomes worse consider placement of myringotomy tube.   Radene Journey, MD

## 2019-08-19 DIAGNOSIS — R200 Anesthesia of skin: Secondary | ICD-10-CM | POA: Diagnosis not present

## 2019-08-20 ENCOUNTER — Encounter (INDEPENDENT_AMBULATORY_CARE_PROVIDER_SITE_OTHER): Payer: Self-pay

## 2019-08-25 ENCOUNTER — Ambulatory Visit (INDEPENDENT_AMBULATORY_CARE_PROVIDER_SITE_OTHER): Payer: Medicare Other | Admitting: Podiatry

## 2019-08-25 ENCOUNTER — Other Ambulatory Visit: Payer: Self-pay

## 2019-08-25 ENCOUNTER — Encounter: Payer: Self-pay | Admitting: Podiatry

## 2019-08-25 DIAGNOSIS — G629 Polyneuropathy, unspecified: Secondary | ICD-10-CM | POA: Diagnosis not present

## 2019-08-25 DIAGNOSIS — M79675 Pain in left toe(s): Secondary | ICD-10-CM | POA: Diagnosis not present

## 2019-08-25 DIAGNOSIS — M79674 Pain in right toe(s): Secondary | ICD-10-CM | POA: Diagnosis not present

## 2019-08-25 DIAGNOSIS — B351 Tinea unguium: Secondary | ICD-10-CM | POA: Diagnosis not present

## 2019-08-25 NOTE — Progress Notes (Signed)
Complaint:  Visit Type: Patient presents  to my office for  preventative foot care services. Complaint: Patient states" my nails have grown long and thick and become painful to walk and wear shoes" Patient is under going chemo... The patient presents for preventative foot care services.   Podiatric Exam: Vascular: dorsalis pedis and posterior tibial pulses are palpable bilateral. Capillary return is immediate. Temperature gradient is WNL. Skin turgor WNL  Sensorium: Diminished Semmes Weinstein monofilament test. Normal tactile sensation bilaterally. Nail Exam: Pt has thick disfigured discolored nails with subungual debris noted bilateral entire nail hallux through fifth toenails Ulcer Exam: There is no evidence of ulcer or pre-ulcerative changes or infection. Orthopedic Exam: Muscle tone and strength are WNL. No limitations in general ROM. No crepitus or effusions noted. Foot type and digits show no abnormalities. Bony prominences are unremarkable. Skin: No Porokeratosis. No infection or ulcers  Diagnosis:  Onychomycosis, , Pain in right toe, pain in left toes  Treatment & Plan Procedures and Treatment: Consent by patient was obtained for treatment procedures.   Debridement of mycotic and hypertrophic toenails, 1 through 5 bilateral and clearing of subungual debris. No ulceration, no infection noted. Patient received additional  diabetic socks.  Return Visit-Office Procedure: Patient instructed to return to the office for a follow up visit 3 months for continued evaluation and treatment.    Gardiner Barefoot DPM

## 2019-08-26 ENCOUNTER — Ambulatory Visit: Payer: Medicare Other | Attending: Internal Medicine

## 2019-08-26 ENCOUNTER — Ambulatory Visit: Payer: Medicare Other

## 2019-08-26 DIAGNOSIS — Z23 Encounter for immunization: Secondary | ICD-10-CM

## 2019-08-26 NOTE — Progress Notes (Signed)
   Covid-19 Vaccination Clinic  Name:  Edward Bailey    MRN: [id number] DOB: [birth year]  08/26/2019  Mr. Schaible was observed post Covid-19 immunization for 15 minutes without incidence. He was provided with Vaccine Information Sheet and instruction to access the V-Safe system.   Mr. Collman was instructed to call 911 with any severe reactions post vaccine: Marland Kitchen Difficulty breathing  . Swelling of your face and throat  . A fast heartbeat  . A bad rash all over your body  . Dizziness and weakness    Immunizations Administered    Name Date Dose VIS Date Route   Pfizer COVID-19 Vaccine 08/26/2019 11:16 AM 0.3 mL 07/03/2019 Intramuscular   Manufacturer: [place]   Lot: [id number]   [place]: [id number]

## 2019-08-29 ENCOUNTER — Inpatient Hospital Stay: Payer: Medicare Other | Attending: Oncology

## 2019-08-29 ENCOUNTER — Other Ambulatory Visit: Payer: Self-pay

## 2019-08-29 VITALS — BP 141/63 | HR 78 | Temp 98.2°F | Resp 20

## 2019-08-29 DIAGNOSIS — Z452 Encounter for adjustment and management of vascular access device: Secondary | ICD-10-CM | POA: Insufficient documentation

## 2019-08-29 DIAGNOSIS — D649 Anemia, unspecified: Secondary | ICD-10-CM | POA: Diagnosis not present

## 2019-08-29 MED ORDER — HEPARIN SOD (PORK) LOCK FLUSH 100 UNIT/ML IV SOLN
500.0000 [IU] | Freq: Once | INTRAVENOUS | Status: AC
  Administered 2019-08-29: 11:00:00 500 [IU]
  Filled 2019-08-29: qty 5

## 2019-08-29 MED ORDER — SODIUM CHLORIDE 0.9% FLUSH
10.0000 mL | Freq: Once | INTRAVENOUS | Status: AC
  Administered 2019-08-29: 11:00:00 10 mL
  Filled 2019-08-29: qty 10

## 2019-08-29 NOTE — Patient Instructions (Signed)

## 2019-09-04 ENCOUNTER — Other Ambulatory Visit: Payer: Self-pay | Admitting: Internal Medicine

## 2019-09-04 DIAGNOSIS — M79661 Pain in right lower leg: Secondary | ICD-10-CM

## 2019-09-04 DIAGNOSIS — C61 Malignant neoplasm of prostate: Secondary | ICD-10-CM | POA: Diagnosis not present

## 2019-09-07 ENCOUNTER — Other Ambulatory Visit: Payer: Self-pay

## 2019-09-07 ENCOUNTER — Ambulatory Visit (HOSPITAL_COMMUNITY)
Admission: RE | Admit: 2019-09-07 | Discharge: 2019-09-07 | Disposition: A | Discharge status: Home / Self Care | Payer: Medicare Other | Source: Ambulatory Visit | Attending: Internal Medicine | Admitting: Internal Medicine

## 2019-09-07 DIAGNOSIS — M79661 Pain in right lower leg: Secondary | ICD-10-CM

## 2019-09-07 NOTE — Progress Notes (Signed)
Right lower extremity venous duplex has been completed. Preliminary results can be found in CV Proc through chart review.  Results were given to [facility] at Dr. Delene Ruffini office.  09/07/19 10:04 AM Carlos Levering RVT

## 2019-09-09 ENCOUNTER — Other Ambulatory Visit: Payer: Medicare Other

## 2019-09-18 DIAGNOSIS — C61 Malignant neoplasm of prostate: Secondary | ICD-10-CM | POA: Diagnosis not present

## 2019-09-21 DIAGNOSIS — C61 Malignant neoplasm of prostate: Secondary | ICD-10-CM | POA: Diagnosis not present

## 2019-09-21 DIAGNOSIS — M79604 Pain in right leg: Secondary | ICD-10-CM | POA: Diagnosis not present

## 2019-09-21 DIAGNOSIS — C7951 Secondary malignant neoplasm of bone: Secondary | ICD-10-CM | POA: Diagnosis not present

## 2019-09-22 ENCOUNTER — Other Ambulatory Visit: Payer: Self-pay

## 2019-09-22 ENCOUNTER — Encounter: Payer: Self-pay | Admitting: Internal Medicine

## 2019-09-22 ENCOUNTER — Other Ambulatory Visit: Payer: Medicare Other | Admitting: Internal Medicine

## 2019-09-22 DIAGNOSIS — Z515 Encounter for palliative care: Secondary | ICD-10-CM

## 2019-09-22 DIAGNOSIS — Z7189 Other specified counseling: Secondary | ICD-10-CM

## 2019-09-22 NOTE — Progress Notes (Signed)
09/22/2019 [facility] Consult Note Telephone: [phone]  Fax: [phone]   PATIENT NAME: Edward Bailey DOB: [birth year] MRN: [id number] [address] [place] [id number] Pt home #:  [phone] Cell:  [phone]   PRIMARY CARE PROVIDER:   [name], MD [place] [employer] [address], [place] [address] [facility] [place] [address]   REFERRING PROVIDER:   Dr. Franchot Bailey ([facility] [phone]) Dr. Zola Bailey ([place]   RESPONSIBLE PARTY:   Edward Bailey (wife) (H) [phone], [phone]) [phone]. (son) Edward Bailey ([id number] [email or web address]      ASSESSMENT / RECOMMENDATIONS:  1. Advance Care Planning: A. Directives: Has DNR and MOST.   B. Goals of Care: To continue with strength building exercises, enjoy his time with spouse. Take day trips outside of the home and sit in the sun.   2. Cognitive/functional status: Spouse feels patient is less stable when he ambulates with his walker. Patient feels he is about the same. He's been exercising regularly and feels this has helped his sturdiness with transfers. He continues basically independent in hygiene, dressing, and toileting. Some assist with bathing. Good appetite. His weight today is 181lbs, which is a decrease of 8lbs over 2 months. At a height of 6'2" his BMI is 23.2kg/m2.  He maintains good spirits; episodically saddened by prospect of his short prognosis. Dr. Diona Bailey (urology) shared that his PSA numbers were on the rise. [name] prognosis could be just months. Patient and his spouse Edward Bailey are asking about [employer]. We discussed the benefit of the Hospice team approach, including SN, SW, Chaplain and physician services. Patient is hesitant; I think he sees a hospice admission as a confirmation that his days are numbered. Spouse Edward Bailey is more on board but of course defers decision to patient. They both wish  the referral to be initiated by Dr. Noni Bailey office. They will let me know if they decide to pursue.   Patient with RLE calf pain which waxes and wanes in intensity, though always constant. No associated redness or swelling in that area. No exacerbated with movement. Pain can keep him awake at night. He gets transient relief for a few hours with Tylenol 500mg  2 tabs tid. He had a LE [place] evaluation which was neg for DVT. Patient tells me he is awaiting a script form his PCP for [place].  -patient will let me know if he wishes to pursue hospice referral but would contact his PCP and have the referral originate from that office.   -take Aleve 220mg  bid, spaced between Tylenol for calf pain.               3. Family Supports: Lives with supportive spouse Edward Bailey"; second marriage of 5 years (just celebrated on Valentine's Day). They both have very supportive children from previous marriages.     4. Follow up Palliative Care Visit: Mon 10/26/2019, 1pm.   I spent 60 minutes providing this consultation, from 12pm-1pm. More than 50% of the time in this consultation was spent coordinating communication.    HISTORY OF PRESENT ILLNESS:  Edward Bailey is a 84 y.o. male with prostate cancer (dx 1999; resection, prior monthly Xtandi q mo and Depot Lurpon q 4 mo depending on testosterone level) with bony metastasis (dx 2017; spine and pelvis), anemia (Hbg 8.1), GERD, HTN, hyper/hypothyroid, and allergic rhinitis. He is s/p hospitalization 9/3-03/29/2019, where he was treated for hyponatremia (SIADH with Na 125) and anemia (Hgb 6.8  tx 1 unit PRBCs). This is a f/u Palliative Care visit from 07/27/2019.    CODE STATUS: DNR/MOST: DNR/DNI. Yes to full scope of medical care, but would wish to limit mechanical ventilation to 1 week or less. Yes to antibiotics, yes to IVFs, no to Tube feedings.   PPS: 60%   HOSPICE ELIGIBILITY/DIAGNOSIS: yes, pending patient comfort level with referral  PAST MEDICAL HISTORY:  Past  Medical History:  Diagnosis Date  . GERD (gastroesophageal reflux disease)   . Hyperthyroidism   . Prostate cancer [facility])    prostate cancer radiation and surgery 15 years ago, Dr. Maceo Bailey    SOCIAL HX:  Social History   Tobacco Use  . Smoking status: Never Smoker  . Smokeless tobacco: Never Used  Substance Use Topics  . Alcohol use: Yes    Comment: occasional    ALLERGIES:  Allergies  Allergen Reactions  . Amoxicillin Rash    Has patient had a PCN reaction causing immediate rash, facial/tongue/throat swelling, SOB or lightheadedness with hypotension: No Has patient had a PCN reaction causing severe rash involving mucus membranes or skin necrosis: No Has patient had a PCN reaction that required hospitalization No Has patient had a PCN reaction occurring within the last 10 years: Yes If all of the above answers are "NO", then may proceed with Cephalosporin use.      PERTINENT MEDICATIONS:  Outpatient Encounter Medications as of 09/22/2019  Medication Sig  . acetaminophen (TYLENOL) 500 MG tablet Take 1,000 mg by mouth every 6 (six) hours as needed. Up to 6 tabs/day  . naproxen sodium (ALEVE) 220 MG tablet Take 220 mg by mouth 2 (two) times daily as needed.  Marland Kitchen amLODipine (NORVASC) 5 MG tablet Take 5 mg by mouth daily.  Marland Kitchen azelastine (ASTELIN) 0.1 % nasal spray Place 1-2 sprays into both nostrils daily as needed for rhinitis or allergies.   . cetirizine (ZYRTEC) 10 MG tablet Take 10 mg by mouth daily as needed for allergies or rhinitis.   . fluticasone (FLONASE) 50 MCG/ACT nasal spray Place 1 spray into both nostrils daily as needed for allergies or rhinitis.   Marland Kitchen gabapentin (NEURONTIN) 300 MG capsule Take 1 capsule (300 mg total) by mouth at bedtime. (Patient taking differently: Take 300 mg by mouth 2 (two) times daily. )  . HYDROcodone-acetaminophen (NORCO) 5-325 MG tablet Take 1 tablet by mouth every 6 (six) hours as needed for moderate pain.  Marland Kitchen levothyroxine (SYNTHROID, LEVOTHROID)  112 MCG tablet Take 112 mcg by mouth See admin instructions. Take 112 mcg by mouth every other night, rotating with the 125 mcg strength  . levothyroxine (SYNTHROID, LEVOTHROID) 125 MCG tablet Take 125 mcg by mouth See admin instructions. Take 125 mcg by mouth every other night, rotating with the 112 mcg strength  . lidocaine-prilocaine (EMLA) cream Apply 1 application topically as needed. (Patient taking differently: Apply 1 application topically as needed (to numb). )  . mirtazapine (REMERON) 15 MG tablet Take 15 mg by mouth at bedtime.  . Multiple Vitamins-Minerals (ONE-A-DAY MENS 50+) TABS Take 1 tablet by mouth daily with breakfast.  . prochlorperazine (COMPAZINE) 10 MG tablet Take 1 tablet (10 mg total) by mouth every 6 (six) hours as needed for nausea or vomiting.   No facility-administered encounter medications on file as of 09/22/2019.    PHYSICAL EXAM:   Well nourished, A & O, pleasantly conversant, in good spirits. Supportive spouse Edward Bailey in attendance.  Abbrevated PE to decreased COVID risk No dyspnea with conversation. No LE  swelling Skin: no rashes exposed areas.  Neurological: otherwise non-focal. Uses walker for stability. Independent in transfers.    Julianne Handler, NP

## 2019-10-02 ENCOUNTER — Ambulatory Visit: Payer: Medicare Other | Admitting: Neurology

## 2019-10-10 ENCOUNTER — Inpatient Hospital Stay: Payer: Medicare Other

## 2019-10-22 DEATH — deceased

## 2019-11-24 ENCOUNTER — Ambulatory Visit: Payer: Medicare Other | Admitting: Podiatry

## 2020-04-01 IMAGING — NM NM BONE WHOLE BODY
2 series · 2 of 2 positions shown · non-contrast
Comparison: 09/10/2017

Correlation: CT abdomen and pelvis 08/05/2018

CLINICAL DATA: Metastatic prostate cancer

EXAM:
NUCLEAR MEDICINE WHOLE BODY BONE SCAN
TECHNIQUE: Whole body anterior and posterior images were obtained approximately
3 hours after intravenous injection of radiopharmaceutical.
RADIOPHARMACEUTICALS:  22 mCi Jechnetium-XXm MDP IV

[Series 1: wbr_bone_40 whole body · 2.66mm/px · 1 of 1 slices shown (1 of 2)]
[im 1/1]
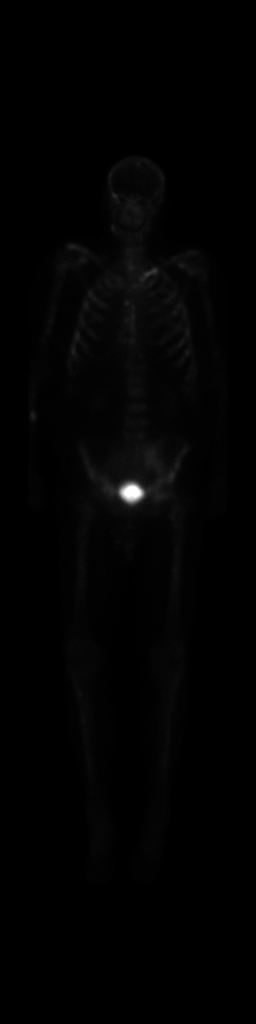

[Series 1: wbr_bone_40 whole body · 2.66mm/px · 1 of 1 slices shown (2 of 2)]
[im 1/1]
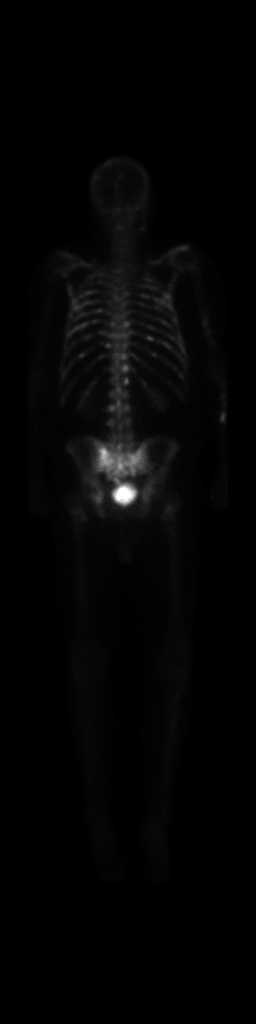

[2 of 2 positions shown; findings below may reference images not displayed]

FINDINGS: Multiple sites of abnormal increased tracer accumulation are
identified consistent with osseous metastatic disease, progressive
since the prior study.

Foci of uptake are seen throughout BILATERAL anterior and posterior
ribs, BILATERAL clavicles, calvarium, sternum, lumbar spine,
BILATERAL humeri, and probably BILATERAL femoral diaphyses.

Progressive increased uptake in posterior LEFT pelvis.

New uptake also identified at the proximal RIGHT tibia.

Question new focus of uptake at the RIGHT forearm versus injection
site.
IMPRESSION: Numerous new sites of abnormal osseous tracer accumulation
consistent with widespread osseous metastatic disease progressive
since 09/10/2017.

New sites include BILATERAL humeri, BILATERAL femoral, RIGHT tibia,
and questionably RIGHT forearm.

## 2020-04-01 IMAGING — CT CT ABD-PELV W/ CM
2 of 5 series · 15 of 46 positions shown, 17 images · IV contrast (APPLIED)
Comparison: 03/18/2018

CLINICAL DATA: Prostate cancer.

EXAM:
CT ABDOMEN AND PELVIS WITH CONTRAST
TECHNIQUE: Multidetector CT imaging of the abdomen and pelvis was performed
using the standard protocol following bolus administration of
intravenous contrast.
CONTRAST:  100mL OMNIPAQUE IOHEXOL 300 MG/ML  SOLN

[Series 2: axial st · axial · 0.81mm/px · z∈[-455,-10]mm · 12 of 105 slices shown, 14 images]
[im 8/105  soft-tissue]
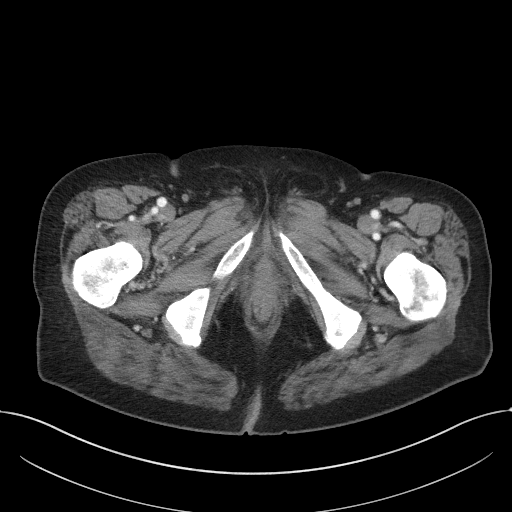
[im 8/105  bone]
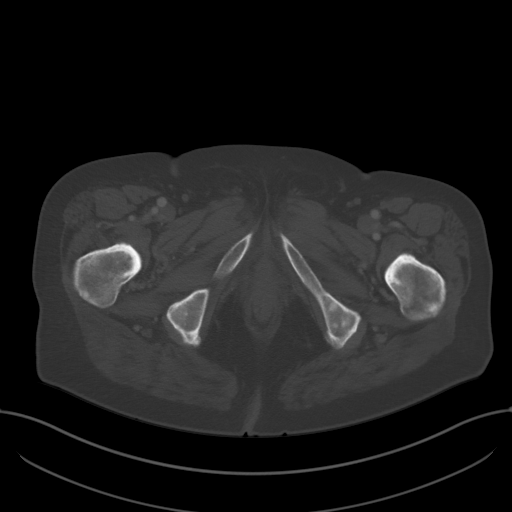
[im 15/105  soft-tissue]
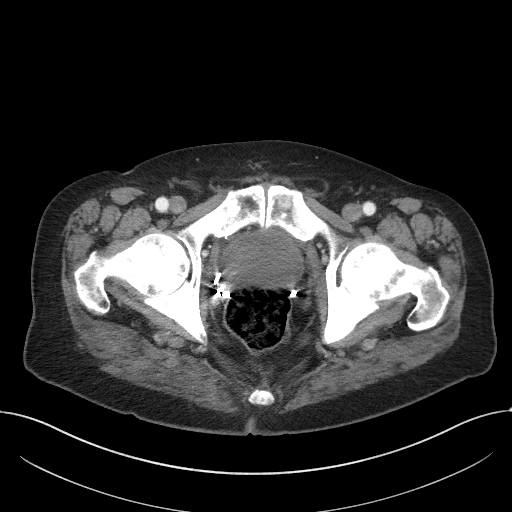
[im 23/105  soft-tissue]
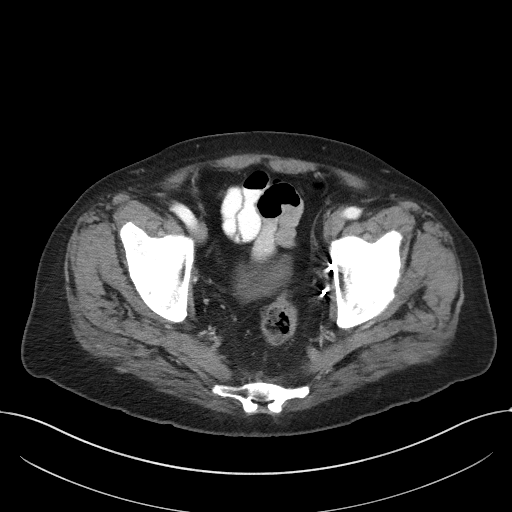
[im 30/105  soft-tissue]
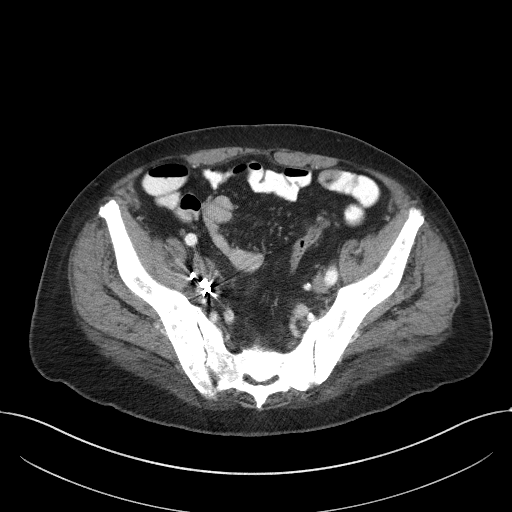
[im 38/105  soft-tissue]
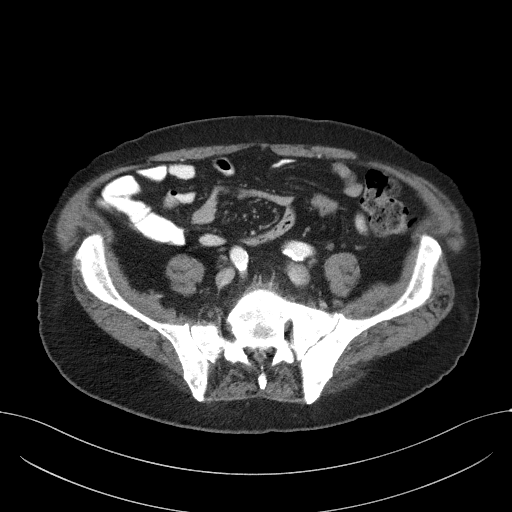
[im 45/105  soft-tissue]
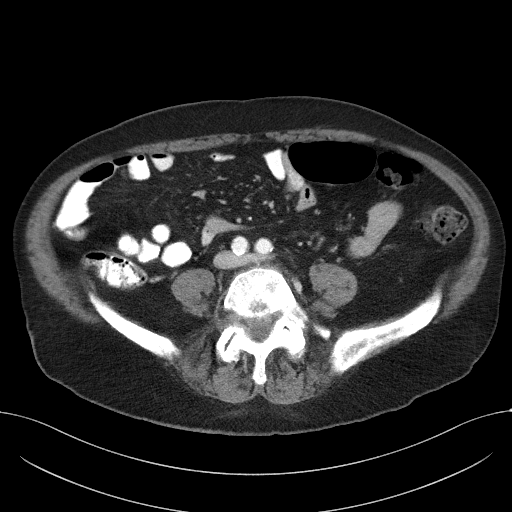
[im 60/105  soft-tissue]
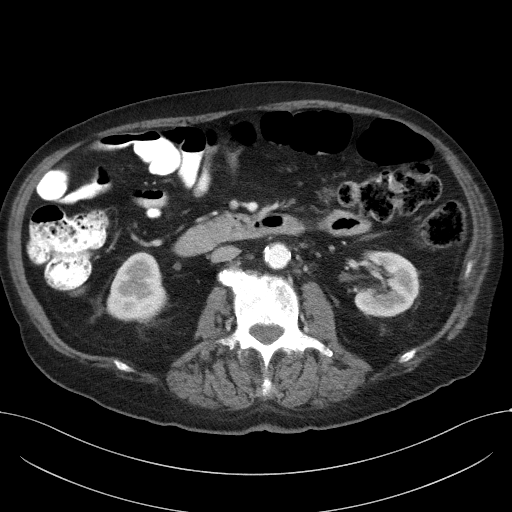
[im 67/105  soft-tissue]
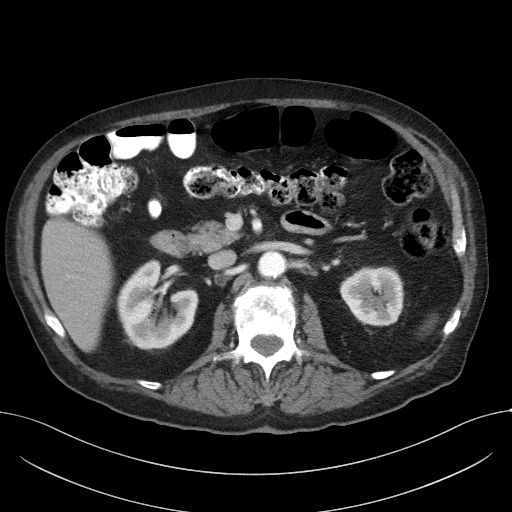
[im 75/105  soft-tissue]
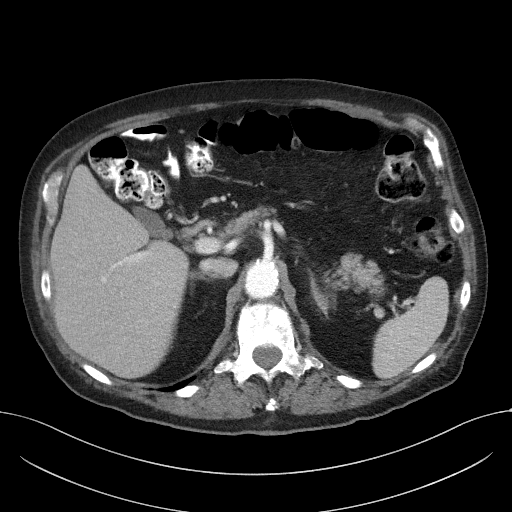
[im 75/105  bone]
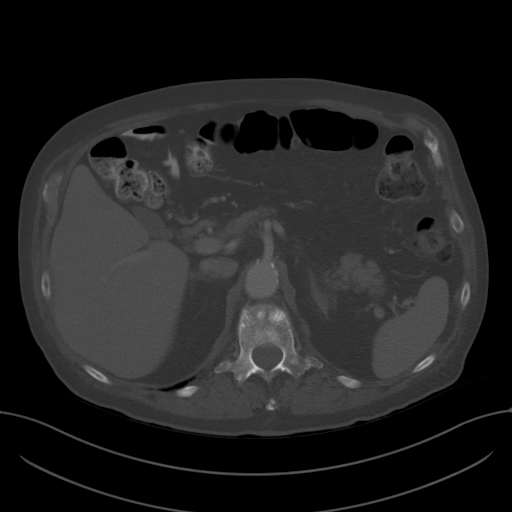
[im 82/105  soft-tissue]
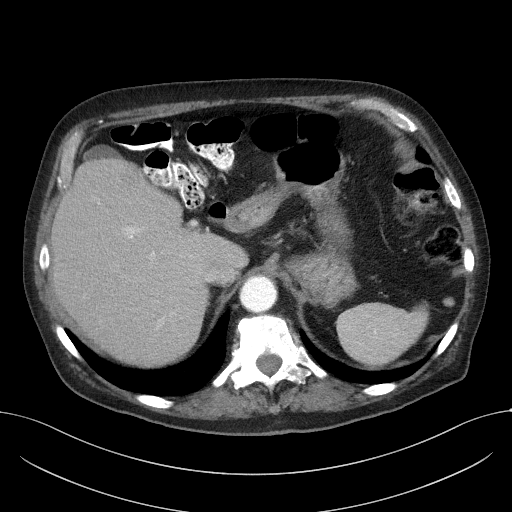
[im 90/105  soft-tissue]
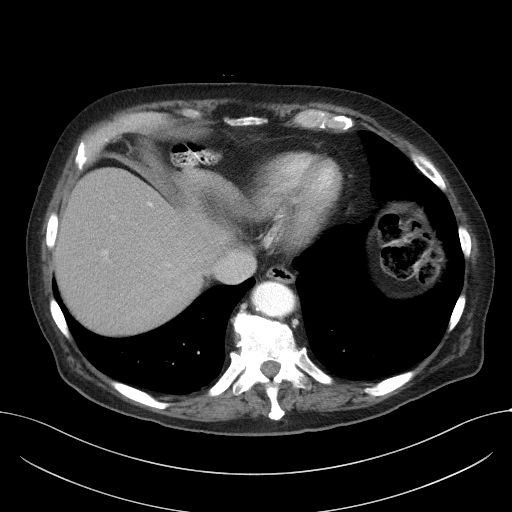
[im 97/105  soft-tissue]
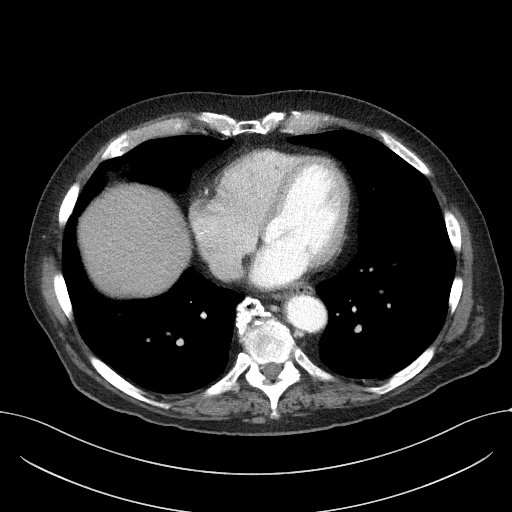

[Series 5: coronal st · coronal · 0.81mm/px · 3 of 94 slices shown]
[im 32/94  soft-tissue]
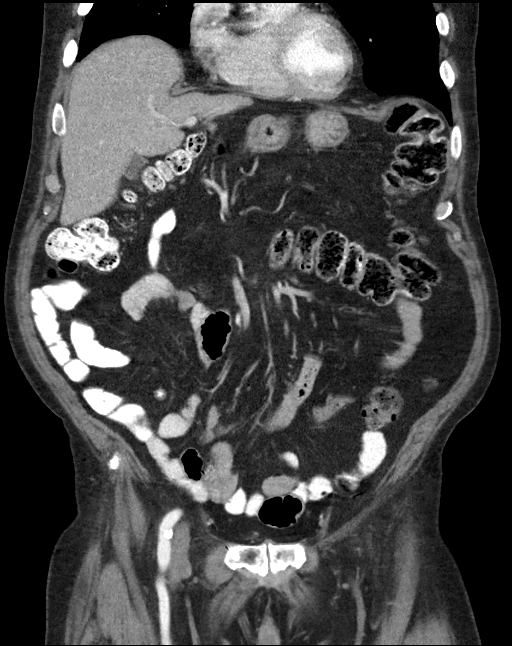
[im 42/94  soft-tissue]
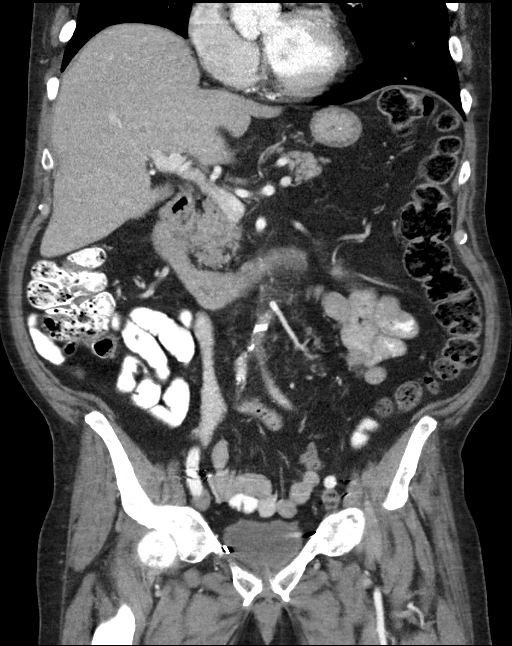
[im 52/94  soft-tissue]
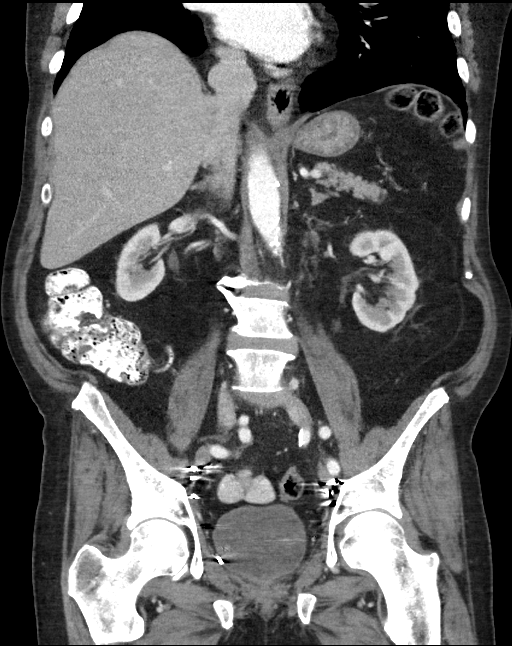

[15 of 46 positions shown; findings below may reference images not displayed]

FINDINGS: Lower chest: Coronary artery calcification is evident.

Hepatobiliary: No focal abnormality within the liver parenchyma.
There is no evidence for gallstones, gallbladder wall thickening, or
pericholecystic fluid. No intrahepatic or extrahepatic biliary
dilation.

Pancreas: No focal mass lesion. No dilatation of the main duct. No
intraparenchymal cyst. No peripancreatic edema.

Spleen: No splenomegaly. No focal mass lesion.

Adrenals/Urinary Tract: No adrenal nodule or mass. Parenchymal
scarring noted in the kidneys bilaterally without hydronephrosis. No
evidence for hydroureter. The urinary bladder appears normal for the
degree of distention.

Stomach/Bowel: Stomach is nondistended. No gastric wall thickening.
No evidence of outlet obstruction. Duodenum is normally positioned
as is the ligament of Treitz. No small bowel wall thickening. No
small bowel dilatation. The terminal ileum is normal. The appendix
is normal. No gross colonic mass. No colonic wall thickening.

Vascular/Lymphatic: There is abdominal aortic atherosclerosis
without aneurysm. There is no gastrohepatic or hepatoduodenal
ligament lymphadenopathy. No intraperitoneal or retroperitoneal
lymphadenopathy. No pelvic sidewall lymphadenopathy. Surgical clips
along both pelvic sidewalls suggest prior lymph node dissection.

Reproductive: Prostate gland surgically absent.

Other: No intraperitoneal free fluid.

Musculoskeletal: The innumerable diffuse sclerotic lesions
throughout the bony anatomy are similar to prior. Index lesion
measured previously in the left inferior pubic ramus at 11 x 7 mm is
stable, measuring 11 x 6 mm today.
IMPRESSION: 1. Stable exam. No new or progressive findings.
2. Innumerable sclerotic lesions throughout the bony anatomy
consistent with metastatic disease. Size and number of the sclerotic
lesions appear stable in the interval.
3.  Aortic Atherosclerois ([id number])

## 2020-10-21 DEATH — deceased

## 2023-05-06 NOTE — Telephone Encounter (Signed)
error 

## 2023-06-12 NOTE — Telephone Encounter (Signed)
Telephone call
